# Patient Record
Sex: Female | Born: 1980
Health system: Southern US, Community
[De-identification: ages and names within clinical notes are randomized; demographics above are authoritative.]

## PROBLEM LIST (undated history)

## (undated) ENCOUNTER — Inpatient Hospital Stay (HOSPITAL_COMMUNITY): Payer: BC Managed Care – PPO

## (undated) ENCOUNTER — Inpatient Hospital Stay (HOSPITAL_COMMUNITY): Payer: 59

## (undated) ENCOUNTER — Inpatient Hospital Stay (HOSPITAL_COMMUNITY): Payer: Self-pay

## (undated) ENCOUNTER — Ambulatory Visit (HOSPITAL_COMMUNITY): Payer: BC Managed Care – PPO

## (undated) DIAGNOSIS — N979 Female infertility, unspecified: Secondary | ICD-10-CM

## (undated) DIAGNOSIS — K52831 Collagenous colitis: Secondary | ICD-10-CM

## (undated) DIAGNOSIS — D649 Anemia, unspecified: Secondary | ICD-10-CM

## (undated) DIAGNOSIS — R51 Headache: Secondary | ICD-10-CM

## (undated) DIAGNOSIS — E079 Disorder of thyroid, unspecified: Secondary | ICD-10-CM

## (undated) DIAGNOSIS — N750 Cyst of Bartholin's gland: Secondary | ICD-10-CM

## (undated) DIAGNOSIS — O30049 Twin pregnancy, dichorionic/diamniotic, unspecified trimester: Secondary | ICD-10-CM

## (undated) DIAGNOSIS — E039 Hypothyroidism, unspecified: Secondary | ICD-10-CM

## (undated) DIAGNOSIS — K900 Celiac disease: Secondary | ICD-10-CM

## (undated) HISTORY — DX: Headache: R51

## (undated) HISTORY — PX: RHINOPLASTY: SUR1284

## (undated) HISTORY — PX: COLONOSCOPY: SHX174

## (undated) HISTORY — PX: TONSILLECTOMY: SUR1361

## (undated) HISTORY — PX: WISDOM TOOTH EXTRACTION: SHX21

## (undated) HISTORY — PX: UPPER GASTROINTESTINAL ENDOSCOPY: SHX188

## (undated) HISTORY — DX: Female infertility, unspecified: N97.9

---

## 2007-02-22 ENCOUNTER — Emergency Department (HOSPITAL_COMMUNITY): Admission: EM | Admit: 2007-02-22 | Discharge: 2007-02-23 | Payer: Self-pay | Admitting: Emergency Medicine

## 2010-02-12 ENCOUNTER — Emergency Department (HOSPITAL_COMMUNITY): Admission: EM | Admit: 2010-02-12 | Discharge: 2010-02-12 | Payer: Self-pay | Admitting: Emergency Medicine

## 2010-05-23 ENCOUNTER — Emergency Department (HOSPITAL_COMMUNITY): Admission: EM | Admit: 2010-05-23 | Discharge: 2010-05-24 | Payer: Self-pay | Admitting: Emergency Medicine

## 2010-06-09 ENCOUNTER — Emergency Department (HOSPITAL_COMMUNITY): Admission: EM | Admit: 2010-06-09 | Discharge: 2010-06-09 | Payer: Self-pay | Admitting: Emergency Medicine

## 2010-11-05 ENCOUNTER — Inpatient Hospital Stay (HOSPITAL_COMMUNITY)
Admission: AD | Admit: 2010-11-05 | Discharge: 2010-11-05 | Payer: Self-pay | Source: Home / Self Care | Admitting: Obstetrics and Gynecology

## 2011-02-27 LAB — URINALYSIS, ROUTINE W REFLEX MICROSCOPIC
Bilirubin Urine: NEGATIVE
Hgb urine dipstick: NEGATIVE
Ketones, ur: NEGATIVE mg/dL
Specific Gravity, Urine: 1.006 (ref 1.005–1.030)

## 2011-02-27 LAB — URINE CULTURE
Colony Count: NO GROWTH
Culture: NO GROWTH

## 2011-02-28 LAB — COMPREHENSIVE METABOLIC PANEL
ALT: 17 U/L (ref 0–35)
AST: 19 U/L (ref 0–37)
Albumin: 3.6 g/dL (ref 3.5–5.2)
Alkaline Phosphatase: 32 U/L — ABNORMAL LOW (ref 39–117)
BUN: 8 mg/dL (ref 6–23)
Calcium: 8.9 mg/dL (ref 8.4–10.5)
Creatinine, Ser: 0.66 mg/dL (ref 0.4–1.2)
GFR calc Af Amer: >60 mL/min (ref >60)
Sodium: 142 mEq/L (ref 135–145)
Total Protein: 5.4 g/dL — ABNORMAL LOW (ref 6.0–8.3)

## 2011-02-28 LAB — DIFFERENTIAL
Eosinophils Absolute: 0.3 10*3/uL (ref 0.0–0.7)
Monocytes Absolute: 0.6 10*3/uL (ref 0.1–1.0)

## 2011-02-28 LAB — URINALYSIS, ROUTINE W REFLEX MICROSCOPIC
Glucose, UA: NEGATIVE mg/dL
Hgb urine dipstick: NEGATIVE
Ketones, ur: NEGATIVE mg/dL
Specific Gravity, Urine: 1.021 (ref 1.005–1.030)
Urobilinogen, UA: 0.2 mg/dL (ref 0.0–1.0)

## 2011-02-28 LAB — POCT PREGNANCY, URINE: Preg Test, Ur: NEGATIVE

## 2011-02-28 LAB — URINE MICROSCOPIC-ADD ON

## 2011-02-28 LAB — CBC
HCT: 35.7 % — ABNORMAL LOW (ref 36.0–46.0)
MCV: 91 fL (ref 78.0–100.0)
Platelets: 175 10*3/uL (ref 150–400)
RBC: 3.92 MIL/uL (ref 3.87–5.11)
WBC: 6.4 10*3/uL (ref 4.0–10.5)

## 2011-02-28 LAB — LIPASE, BLOOD: Lipase: 41 U/L (ref 11–59)

## 2011-03-07 LAB — URINE MICROSCOPIC-ADD ON

## 2011-03-07 LAB — WET PREP, GENITAL
Trich, Wet Prep: NONE SEEN
Yeast Wet Prep HPF POC: NONE SEEN

## 2011-03-07 LAB — URINALYSIS, ROUTINE W REFLEX MICROSCOPIC
Glucose, UA: NEGATIVE mg/dL
Specific Gravity, Urine: 1.022 (ref 1.005–1.030)
Urobilinogen, UA: 0.2 mg/dL (ref 0.0–1.0)

## 2011-03-07 LAB — GC/CHLAMYDIA PROBE AMP, GENITAL
Chlamydia, DNA Probe: NEGATIVE
GC Probe Amp, Genital: NEGATIVE

## 2011-03-07 LAB — POCT PREGNANCY, URINE: Preg Test, Ur: NEGATIVE

## 2011-10-24 ENCOUNTER — Encounter: Payer: Self-pay | Admitting: *Deleted

## 2011-10-24 ENCOUNTER — Emergency Department (HOSPITAL_COMMUNITY)
Admission: EM | Admit: 2011-10-24 | Discharge: 2011-10-24 | Discharge status: Against Medical Advice | Payer: BC Managed Care – PPO | Attending: Emergency Medicine | Admitting: Emergency Medicine

## 2011-10-24 DIAGNOSIS — N979 Female infertility, unspecified: Secondary | ICD-10-CM | POA: Insufficient documentation

## 2011-10-24 DIAGNOSIS — R51 Headache: Secondary | ICD-10-CM | POA: Insufficient documentation

## 2011-10-24 HISTORY — DX: Disorder of thyroid, unspecified: E07.9

## 2011-10-24 HISTORY — DX: Collagenous colitis: K52.831

## 2011-10-24 HISTORY — DX: Celiac disease: K90.0

## 2011-10-24 NOTE — ED Notes (Signed)
Patient would like to just leave at this time.  States she doesn't want to stay.  Informed her to stay and see md first.

## 2011-10-24 NOTE — ED Notes (Signed)
Pt in c/o blurred vision and episode of vision loss in right eye, pt states the vision loss has resolved, pt then states she was having trouble reading and understanding what she was seeing also with aphasia, these symptoms have resolved at this time, since then pt developed severe headache with no history of same, also with episode of right arm and facial numbness that has resolved

## 2011-10-24 NOTE — ED Notes (Signed)
[id number]<QM> Expected date:10/24/11<BR> Expected time: 8:15 PM<BR> Means of arrival:Ambulance<BR> Comments:<BR> EMS 110 GC, 75 yof UTI w fever

## 2011-10-24 NOTE — ED Provider Notes (Signed)
History     CSN: [id number] Arrival date & time: 10/24/2011  7:39 PM   First MD Initiated Contact with Patient 10/24/11 2014      Chief Complaint  Patient presents with  . Headache    (Consider location/radiation/quality/duration/timing/severity/associated sxs/prior treatment) HPI  Past Medical History  Diagnosis Date  . Celiac disease   . Thyroid disease   . Collagenous colitis     Past Surgical History  Procedure Date  . Tonsillectomy     History reviewed. No pertinent family history.  History  Substance Use Topics  . Smoking status: Never Smoker   . Smokeless tobacco: Not on file  . Alcohol Use: Yes    OB History    Grav Para Term Preterm Abortions TAB SAB Ect Mult Living                  Review of Systems  Allergies  Synthroid and Xifaxan  Home Medications   Current Outpatient Rx  Name Route Sig Dispense Refill  . LEVOTHYROXINE SODIUM 125 MCG PO TABS Oral Take 125 mcg by mouth daily.        BP 107/70  Pulse 80  Temp(Src) 99.1 F (37.3 C) (Oral)  Resp 18  SpO2 100%  LMP 10/02/2011  Physical Exam  ED Course  Procedures (including critical care time)  Labs Reviewed - No data to display No results found.   No diagnosis found.    MDM  Patient left after triage not seen by provider        Arman Filter, NP 10/24/11 2040

## 2011-10-25 NOTE — ED Provider Notes (Signed)
Medical screening examination/treatment/procedure(s) were performed by non-physician practitioner and as supervising physician I was immediately available for consultation/collaboration.    Jadene Stemmer R Felicia Both, MD 10/25/11 0001 

## 2011-12-13 NOTE — L&D Delivery Note (Signed)
Delivery Note   Emily [name], Emily [name] [[id number]]  At 4:42 PM a viable female was delivered via Vaginal, Spontaneous Delivery (Presentation: Left Occiput Anterior).  APGAR: 9, 9; weight 6 lb 2.9 oz (2805 g).   Placenta status: Intact, Spontaneous Pathology.  Cord: 3 vessels with the following complications: None, shoulder cord.  Anesthesia: Epidural  Episiotomy: none Lacerations: 2nd degree Suture Repair: 3.0 vicryl rapide Est. Blood Loss (mL): 400cc    Emily [name], Emily [name] [[id number]]  At 4:48 PM a viable female was delivered via  (Presentation: ;  ).  APGAR: , 9; weight 4 lb 7.8 oz (2035 g).   Placenta status: delivered, intwact .  Cord: 3 vessels with the following complications:none, shoulder cord.  Anesthesia:  epidural Episiotomy: none Lacerations: 2nd degree Suture Repair: 3.0 vicryl rapide Est. Blood Loss (mL): 400cc   Mom to postpartum.   Baby A to stay with mother and father.   Baby B to stay with mother and father.  BOVARD,Giles Currie 12/02/2012, 5:29 PM   B+/ Br/ RI / Contra?

## 2011-12-26 ENCOUNTER — Encounter (HOSPITAL_COMMUNITY): Payer: Self-pay | Admitting: Pharmacist

## 2011-12-26 ENCOUNTER — Encounter (HOSPITAL_COMMUNITY): Payer: Self-pay | Admitting: *Deleted

## 2011-12-27 ENCOUNTER — Encounter (HOSPITAL_COMMUNITY): Payer: Self-pay | Admitting: Obstetrics and Gynecology

## 2011-12-27 DIAGNOSIS — N750 Cyst of Bartholin's gland: Secondary | ICD-10-CM | POA: Diagnosis present

## 2011-12-27 HISTORY — DX: Cyst of Bartholin's gland: N75.0

## 2011-12-27 NOTE — H&P (Signed)
Emily [name] is an 31 y.o. female with recurrent Bartholin's Gland abscess/cyst.  Treated multiple times with I&D/ Word catheter.  Pt desires more definitive management, d/w pt R/B/A of marsupilization,  Pt desires  Ob/Gynecological History: G0P0 No abn pap No STDs Recurrent bartholin gland abscess/cyst Patient's last menstrual period was 12/26/2011.    Past Medical History  Diagnosis Date  . Celiac disease   . Thyroid disease   . Collagenous colitis   . Hypothyroidism   . Anemia     history  . Bartholin cyst 12/27/2011    Past Surgical History  Procedure Date  . Tonsillectomy   . Rhinoplasty   . Upper gastrointestinal endoscopy   . Colonoscopy     History reviewed. No pertinent family history.  Social History:  reports that she has never smoked. She has never used smokeless tobacco. She reports that she drinks alcohol. She reports that she does not use illicit drugs.  Allergies:  Allergies  Allergen Reactions  . Xifaxan Anaphylaxis  . Synthroid     GI upset with brand name Synthroid; tolerates Unithroid.   Meds BMZ cream, unithroid   Review of Systems  Constitutional: Negative.   HENT: Negative.   Eyes: Negative.   Respiratory: Negative.   Cardiovascular: Negative.   Gastrointestinal: Negative.   Genitourinary: Negative.   Musculoskeletal: Negative.   Skin: Negative.   Neurological: Negative.   Psychiatric/Behavioral: Negative.     Last menstrual period 12/26/2011. Physical Exam  Constitutional: She is oriented to person, place, and time. She appears well-developed and well-nourished.  HENT:  Head: Normocephalic and atraumatic.  Neck: Normal range of motion. Neck supple. No thyromegaly present.  Cardiovascular: Normal rate and regular rhythm.   Respiratory: Effort normal and breath sounds normal. No respiratory distress.  GI: Soft. Bowel sounds are normal. She exhibits no distension. There is no tenderness.  Musculoskeletal: Normal range of motion.    Neurological: She is alert and oriented to person, place, and time.  Skin: Skin is warm and dry.  Psychiatric: She has a normal mood and affect. Her behavior is normal.     Assessment/Plan: 30yo with recurrent Bartholin gland sbscess/cyst for marsuoilization after discussion of R/B/A wishes to proceed.  BOVARD,Tiphany Fayson 12/27/2011, 9:33 AM

## 2011-12-28 ENCOUNTER — Encounter (HOSPITAL_COMMUNITY): Payer: Self-pay | Admitting: *Deleted

## 2011-12-28 ENCOUNTER — Encounter (HOSPITAL_COMMUNITY): Payer: Self-pay | Admitting: Anesthesiology

## 2011-12-28 ENCOUNTER — Ambulatory Visit (HOSPITAL_COMMUNITY): Payer: BC Managed Care – PPO | Admitting: Anesthesiology

## 2011-12-28 ENCOUNTER — Encounter (HOSPITAL_COMMUNITY)
Admission: RE | Disposition: A | Discharge status: Home / Self Care | Payer: Self-pay | Source: Ambulatory Visit | Attending: Obstetrics and Gynecology

## 2011-12-28 ENCOUNTER — Ambulatory Visit (HOSPITAL_COMMUNITY)
Admission: RE | Admit: 2011-12-28 | Discharge: 2011-12-28 | Disposition: A | Discharge status: Home / Self Care | Payer: BC Managed Care – PPO | Source: Ambulatory Visit | Attending: Obstetrics and Gynecology | Admitting: Obstetrics and Gynecology

## 2011-12-28 DIAGNOSIS — N750 Cyst of Bartholin's gland: Secondary | ICD-10-CM | POA: Diagnosis present

## 2011-12-28 HISTORY — PX: BARTHOLIN CYST MARSUPIALIZATION: SHX5383

## 2011-12-28 HISTORY — DX: Hypothyroidism, unspecified: E03.9

## 2011-12-28 HISTORY — DX: Cyst of Bartholin's gland: N75.0

## 2011-12-28 HISTORY — DX: Anemia, unspecified: D64.9

## 2011-12-28 LAB — CBC
Hemoglobin: 12.6 g/dL (ref 12.0–15.0)
MCH: 29.3 pg (ref 26.0–34.0)
MCV: 86.5 fL (ref 78.0–100.0)
RBC: 4.3 MIL/uL (ref 3.87–5.11)
WBC: 7.6 10*3/uL (ref 4.0–10.5)

## 2011-12-28 LAB — PREGNANCY, URINE: Preg Test, Ur: NEGATIVE

## 2011-12-28 SURGERY — MARSUPIALIZATION, CYST, BARTHOLIN'S GLAND
Anesthesia: Monitor Anesthesia Care | Site: Vagina | Wound class: Clean Contaminated

## 2011-12-28 MED ORDER — ONDANSETRON HCL 4 MG/2ML IJ SOLN
INTRAMUSCULAR | Status: AC
Start: 2011-12-28 — End: 2011-12-28
  Filled 2011-12-28: qty 2

## 2011-12-28 MED ORDER — FENTANYL CITRATE 0.05 MG/ML IJ SOLN
INTRAMUSCULAR | Status: AC
  Administered 2011-12-28: 25 ug via INTRAVENOUS
  Filled 2011-12-28: qty 2

## 2011-12-28 MED ORDER — FENTANYL CITRATE 0.05 MG/ML IJ SOLN
INTRAMUSCULAR | Status: DC | PRN
  Administered 2011-12-28 (×2): 50 ug via INTRAVENOUS

## 2011-12-28 MED ORDER — FENTANYL CITRATE 0.05 MG/ML IJ SOLN
25.0000 ug | INTRAMUSCULAR | Status: DC | PRN
  Administered 2011-12-28 (×2): 25 ug via INTRAVENOUS
  Administered 2011-12-28: 50 ug via INTRAVENOUS

## 2011-12-28 MED ORDER — MIDAZOLAM HCL 5 MG/5ML IJ SOLN
INTRAMUSCULAR | Status: DC | PRN
  Administered 2011-12-28: 1 mg via INTRAVENOUS

## 2011-12-28 MED ORDER — KETOROLAC TROMETHAMINE 30 MG/ML IJ SOLN
30.0000 mg | Freq: Once | INTRAMUSCULAR | Status: AC
  Administered 2011-12-28: 30 mg via INTRAVENOUS

## 2011-12-28 MED ORDER — PROPOFOL 10 MG/ML IV EMUL
INTRAVENOUS | Status: DC | PRN
  Administered 2011-12-28: 140 ug/kg/min via INTRAVENOUS

## 2011-12-28 MED ORDER — FENTANYL CITRATE 0.05 MG/ML IJ SOLN
INTRAMUSCULAR | Status: AC
  Filled 2011-12-28: qty 2

## 2011-12-28 MED ORDER — CEFAZOLIN SODIUM 1-5 GM-% IV SOLN
1.0000 g | INTRAVENOUS | Status: AC
  Administered 2011-12-28: 1 g via INTRAVENOUS

## 2011-12-28 MED ORDER — OXYCODONE-ACETAMINOPHEN 5-325 MG PO TABS
ORAL_TABLET | ORAL | Status: AC
  Administered 2011-12-28: 1 via ORAL
  Filled 2011-12-28: qty 1

## 2011-12-28 MED ORDER — KETOROLAC TROMETHAMINE 30 MG/ML IJ SOLN
INTRAMUSCULAR | Status: AC
  Filled 2011-12-28: qty 1

## 2011-12-28 MED ORDER — METRONIDAZOLE 500 MG PO TABS: 500.0000 mg | ORAL_TABLET | Freq: Three times a day (TID) | ORAL | Status: AC

## 2011-12-28 MED ORDER — ONDANSETRON HCL 4 MG/2ML IJ SOLN
INTRAMUSCULAR | Status: DC | PRN
  Administered 2011-12-28: 4 mg via INTRAVENOUS

## 2011-12-28 MED ORDER — LACTATED RINGERS IV SOLN
INTRAVENOUS | Status: DC
  Administered 2011-12-28: 15:00:00 via INTRAVENOUS
  Administered 2011-12-28: 125 mL/h via INTRAVENOUS

## 2011-12-28 MED ORDER — MEPERIDINE HCL 25 MG/ML IJ SOLN: 6.2500 mg | INTRAMUSCULAR | Status: DC | PRN

## 2011-12-28 MED ORDER — LIDOCAINE HCL (CARDIAC) 20 MG/ML IV SOLN
INTRAVENOUS | Status: AC
  Filled 2011-12-28: qty 5

## 2011-12-28 MED ORDER — PROPOFOL 10 MG/ML IV EMUL
INTRAVENOUS | Status: AC
Start: 2011-12-28 — End: 2011-12-28
  Filled 2011-12-28: qty 50

## 2011-12-28 MED ORDER — OXYCODONE-ACETAMINOPHEN 5-325 MG PO TABS
1.0000 | ORAL_TABLET | ORAL | Status: DC | PRN
  Administered 2011-12-28: 1 via ORAL

## 2011-12-28 MED ORDER — MIDAZOLAM HCL 2 MG/2ML IJ SOLN
INTRAMUSCULAR | Status: AC
Start: 2011-12-28 — End: 2011-12-28
  Filled 2011-12-28: qty 2

## 2011-12-28 MED ORDER — KETOROLAC TROMETHAMINE 30 MG/ML IJ SOLN
INTRAMUSCULAR | Status: AC
  Administered 2011-12-28: 30 mg via INTRAVENOUS
  Filled 2011-12-28: qty 1

## 2011-12-28 MED ORDER — LIDOCAINE-EPINEPHRINE (PF) 2 %-1:200000 IJ SOLN
INTRAMUSCULAR | Status: DC | PRN
  Administered 2011-12-28: 10 mL

## 2011-12-28 MED ORDER — LIDOCAINE HCL 1 % IJ SOLN: INTRAMUSCULAR | Status: DC | PRN

## 2011-12-28 MED ORDER — CEFAZOLIN SODIUM 1-5 GM-% IV SOLN
INTRAVENOUS | Status: AC
  Filled 2011-12-28: qty 50

## 2011-12-28 SURGICAL SUPPLY — 23 items
BLADE SURG 11 STRL SS (BLADE) ×2 IMPLANT
CLOTH BEACON ORANGE TIMEOUT ST (SAFETY) ×2 IMPLANT
CONTAINER PREFILL 10% NBF 15ML (MISCELLANEOUS) IMPLANT
COUNTER NEEDLE 1200 MAGNETIC (NEEDLE) IMPLANT
ELECT REM PT RETURN 9FT ADLT (ELECTROSURGICAL)
ELECTRODE REM PT RTRN 9FT ADLT (ELECTROSURGICAL) IMPLANT
GLOVE BIO SURGEON STRL SZ 6.5 (GLOVE) ×4 IMPLANT
GOWN PREVENTION PLUS LG XLONG (DISPOSABLE) ×4 IMPLANT
NDL HYPO 25X1 1.5 SAFETY (NEEDLE) ×1 IMPLANT
NEEDLE HYPO 25X1 1.5 SAFETY (NEEDLE) ×2 IMPLANT
NS IRRIG 1000ML POUR BTL (IV SOLUTION) ×2 IMPLANT
PACK VAGINAL MINOR WOMEN LF (CUSTOM PROCEDURE TRAY) ×2 IMPLANT
PAD PREP 24X48 CUFFED NSTRL (MISCELLANEOUS) ×2 IMPLANT
PENCIL BUTTON HOLSTER BLD 10FT (ELECTRODE) IMPLANT
SUT VIC AB 3-0 CT1 27 (SUTURE) ×6
SUT VIC AB 3-0 CT1 TAPERPNT 27 (SUTURE) ×3 IMPLANT
SWAB CULTURE LIQ STUART DBL (MISCELLANEOUS) IMPLANT
SYR CONTROL 10ML LL (SYRINGE) IMPLANT
TOWEL OR 17X24 6PK STRL BLUE (TOWEL DISPOSABLE) ×4 IMPLANT
TUBE ANAEROBIC SPECIMEN COL (MISCELLANEOUS) IMPLANT
TUBING NON-CON 1/4 X 20 CONN (TUBING) IMPLANT
WATER STERILE IRR 1000ML POUR (IV SOLUTION) ×1 IMPLANT
YANKAUER SUCT BULB TIP NO VENT (SUCTIONS) IMPLANT

## 2011-12-28 NOTE — Anesthesia Preprocedure Evaluation (Addendum)
Anesthesia Evaluation  Patient identified by MRN, date of birth, ID band Patient awake    Reviewed: Allergy & Precautions, H&P , NPO status , Patient's Chart, lab work & pertinent test results  Airway Mallampati: I TM Distance: >3 FB Neck ROM: Full    Dental No notable dental hx. (+) Teeth Intact   Pulmonary neg pulmonary ROS,  clear to auscultation  Pulmonary exam normal       Cardiovascular neg cardio ROS Regular Normal    Neuro/Psych Negative Neurological ROS  Negative Psych ROS   GI/Hepatic Neg liver ROS, Chronic colitis-  Celiac Disease   Endo/Other  Hypothyroidism   Renal/GU negative Renal ROS  Genitourinary negative   Musculoskeletal negative musculoskeletal ROS (+)   Abdominal   Peds  Hematology negative hematology ROS (+)   Anesthesia Other Findings   Reproductive/Obstetrics negative OB ROS                          Anesthesia Physical Anesthesia Plan  ASA: II  Anesthesia Plan: MAC   Post-op Pain Management:    Induction: Intravenous  Airway Management Planned: Mask and Natural Airway  Additional Equipment:   Intra-op Plan:   Post-operative Plan:   Informed Consent: I have reviewed the patients History and Physical, chart, labs and discussed the procedure including the risks, benefits and alternatives for the proposed anesthesia with the patient or authorized representative who has indicated his/her understanding and acceptance.     Plan Discussed with: Anesthesiologist, CRNA and Surgeon  Anesthesia Plan Comments:         Anesthesia Quick Evaluation

## 2011-12-28 NOTE — Interval H&P Note (Signed)
History and Physical Interval Note:  12/28/2011 2:27 PM  Emily [name]  has presented today for surgery, with the diagnosis of Bartholin's Cyst  The various methods of treatment have been discussed with the patient and family. After consideration of risks, benefits and other options for treatment, the patient has consented to  Procedure(s): BARTHOLIN CYST MARSUPIALIZATION as a surgical intervention .  The patients' history has been reviewed, patient examined, no change in status, stable for surgery.  I have reviewed the patients' chart and labs.  Questions were answered to the patient's satisfaction.     [name],Emily Spearing  H&P reviewed, no changes.  Procedure reviewed including risks/benefits and alternatives, will proceed J [name]

## 2011-12-28 NOTE — Brief Op Note (Signed)
12/28/2011  3:26 PM  PATIENT:  Emily [name]  31 y.o. female  PRE-OPERATIVE DIAGNOSIS:  Bartholin's Cyst  POST-OPERATIVE DIAGNOSIS:  Bartholin's Cyst  PROCEDURE:  Procedure(s): BARTHOLIN CYST MARSUPIALIZATION  SURGEON:  Surgeon(s): Sherron Monday, MD  ANESTHESIA:   IV sedation  EBL:   minimal  DRAINS: none   LOCAL MEDICATIONS USED:  LIDOCAINE 2% with epinephrine 8CC  SPECIMEN:  No Specimen  DISPOSITION OF SPECIMEN:  N/A  COUNTS:  YES  DICTATION: .Other Dictation: Dictation Number [id number]  PLAN OF CARE: Discharge to home after PACU  PATIENT DISPOSITION:  PACU - hemodynamically stable.   Delay start of Pharmacological VTE agent (>24hrs) due to surgical blood loss or risk of bleeding:  {YES/NO/NOT APPLICABLE:20182

## 2011-12-28 NOTE — Transfer of Care (Signed)
Immediate Anesthesia Transfer of Care Note  Patient: Emily [name]  Procedure(s) Performed:  BARTHOLIN CYST MARSUPIALIZATION  Patient Location: PACU  Anesthesia Type: MAC combined with regional for post-op pain  Level of Consciousness: awake, alert  and oriented  Airway & Oxygen Therapy: Patient Spontanous Breathing  Post-op Assessment: Report given to PACU RN and Post -op Vital signs reviewed and stable  Post vital signs: Reviewed and stable Filed Vitals:   12/28/11 1409  BP: 119/79  Pulse: 89  Temp: 37 C  Resp: 18    Complications: No apparent anesthesia complications

## 2011-12-28 NOTE — Anesthesia Postprocedure Evaluation (Signed)
  Anesthesia Post-op Note  Patient: Emily [name]  Procedure(s) Performed:  BARTHOLIN CYST MARSUPIALIZATION  Patient is awake and responsive. Pain and nausea are reasonably well controlled. Vital signs are stable and clinically acceptable. Oxygen saturation is clinically acceptable. There are no apparent anesthetic complications at this time. Patient is ready for discharge.

## 2011-12-29 ENCOUNTER — Encounter (HOSPITAL_COMMUNITY): Payer: Self-pay | Admitting: Obstetrics and Gynecology

## 2011-12-29 NOTE — Op Note (Signed)
Emily [name], Emily [name]                ACCOUNT NO.:  [id number]  MEDICAL RECORD NO.:  [id number]  LOCATION:  [facility]                          FACILITY:  [facility]  PHYSICIAN:  Sherron Monday, MD        DATE OF BIRTH:  08-12-1981  DATE OF PROCEDURE:  12/28/2011 DATE OF DISCHARGE:  12/28/2011                              OPERATIVE REPORT   PREOPERATIVE DIAGNOSIS:  Recurrent Bartholin cyst.  POSTOPERATIVE DIAGNOSIS:  Recurrent Bartholin cyst.  PROCEDURE:  Marsupialization of Bartholin cyst.  SURGEON:  Sherron Monday, MD  ANESTHESIA:  IV sedation, 2% lidocaine with epinephrine of 8 mL.  ESTIMATED BLOOD LOSS:  Minimal.  URINE OUTPUT:  I and O cath prior to the procedure.  COMPLICATIONS:  None.  PATHOLOGY:  None.  PROCEDURE:  After informed consent was reviewed with the patient including risks, benefits, and alternatives of the surgical procedure. She was transported to the OR, placed on the table in supine position. Sedation was induced and found to be adequate.  She was then placed in the [place] stirrups, prepped and draped in the normal sterile fashion. Her bladder was sterilely drained.  Her Bartholin gland was anesthetized with 2% lidocaine with epinephrine.  Inside the hymenal ring, approximately 1 cm incision was made and the cyst was found to be an abscess and was drained.  The abscess was then irrigated with saline and lidocaine.  The incision was outlined holding the cyst wall to the incision with 3-0 Vicryl.  Sponge, lap, and needle counts were correct x2.  The patient tolerated the procedure well.     Sherron Monday, MD     JB/MEDQ  D:  12/28/2011  T:  12/29/2011  Job:  [id number]

## 2012-05-28 LAB — OB RESULTS CONSOLE RUBELLA ANTIBODY, IGM: Rubella: IMMUNE

## 2012-05-28 LAB — OB RESULTS CONSOLE HEPATITIS B SURFACE ANTIGEN: Hepatitis B Surface Ag: NEGATIVE

## 2012-05-28 LAB — OB RESULTS CONSOLE ABO/RH

## 2012-05-28 LAB — OB RESULTS CONSOLE ANTIBODY SCREEN: Antibody Screen: NEGATIVE

## 2012-05-28 LAB — OB RESULTS CONSOLE GC/CHLAMYDIA: Gonorrhea: NEGATIVE

## 2012-10-07 ENCOUNTER — Ambulatory Visit (HOSPITAL_COMMUNITY)
Admit: 2012-10-07 | Discharge: 2012-10-07 | Disposition: A | Discharge status: Home / Self Care | Payer: BC Managed Care – PPO | Source: Ambulatory Visit | Attending: Obstetrics and Gynecology | Admitting: Obstetrics and Gynecology

## 2012-10-07 ENCOUNTER — Inpatient Hospital Stay (HOSPITAL_COMMUNITY)
Admission: AD | Admit: 2012-10-07 | Discharge: 2012-10-07 | Disposition: A | Discharge status: Home / Self Care | Payer: BC Managed Care – PPO | Source: Ambulatory Visit | Attending: Obstetrics and Gynecology | Admitting: Obstetrics and Gynecology

## 2012-10-07 ENCOUNTER — Encounter (HOSPITAL_COMMUNITY): Payer: Self-pay | Admitting: *Deleted

## 2012-10-07 DIAGNOSIS — O99891 Other specified diseases and conditions complicating pregnancy: Secondary | ICD-10-CM | POA: Insufficient documentation

## 2012-10-07 DIAGNOSIS — O26899 Other specified pregnancy related conditions, unspecified trimester: Secondary | ICD-10-CM

## 2012-10-07 DIAGNOSIS — F29 Unspecified psychosis not due to a substance or known physiological condition: Secondary | ICD-10-CM | POA: Insufficient documentation

## 2012-10-07 DIAGNOSIS — R0602 Shortness of breath: Secondary | ICD-10-CM | POA: Insufficient documentation

## 2012-10-07 DIAGNOSIS — R519 Headache, unspecified: Secondary | ICD-10-CM

## 2012-10-07 DIAGNOSIS — O30009 Twin pregnancy, unspecified number of placenta and unspecified number of amniotic sacs, unspecified trimester: Secondary | ICD-10-CM | POA: Insufficient documentation

## 2012-10-07 DIAGNOSIS — R209 Unspecified disturbances of skin sensation: Secondary | ICD-10-CM | POA: Insufficient documentation

## 2012-10-07 DIAGNOSIS — G43809 Other migraine, not intractable, without status migrainosus: Secondary | ICD-10-CM

## 2012-10-07 DIAGNOSIS — G43109 Migraine with aura, not intractable, without status migrainosus: Secondary | ICD-10-CM

## 2012-10-07 LAB — URIC ACID: Uric Acid, Serum: 3.8 mg/dL (ref 2.4–7.0)

## 2012-10-07 LAB — URINE MICROSCOPIC-ADD ON

## 2012-10-07 LAB — URINALYSIS, ROUTINE W REFLEX MICROSCOPIC
Ketones, ur: 15 mg/dL — AB
Nitrite: NEGATIVE
Protein, ur: NEGATIVE mg/dL
Urobilinogen, UA: 0.2 mg/dL (ref 0.0–1.0)

## 2012-10-07 LAB — COMPREHENSIVE METABOLIC PANEL
ALT: 9 U/L (ref 0–35)
AST: 15 U/L (ref 0–37)
Albumin: 2.7 g/dL — ABNORMAL LOW (ref 3.5–5.2)
Alkaline Phosphatase: 103 U/L (ref 39–117)
BUN: 4 mg/dL — ABNORMAL LOW (ref 6–23)
Potassium: 3.5 mEq/L (ref 3.5–5.1)
Sodium: 136 mEq/L (ref 135–145)
Total Protein: 5.5 g/dL — ABNORMAL LOW (ref 6.0–8.3)

## 2012-10-07 LAB — CBC
MCHC: 33.7 g/dL (ref 30.0–36.0)
Platelets: 164 10*3/uL (ref 150–400)
RDW: 13.1 % (ref 11.5–15.5)
WBC: 12.8 10*3/uL — ABNORMAL HIGH (ref 4.0–10.5)

## 2012-10-07 MED ORDER — FERROUS SULFATE 325 (65 FE) MG PO TABS: 325.0000 mg | ORAL_TABLET | Freq: Three times a day (TID) | ORAL | Status: DC

## 2012-10-07 NOTE — MAU Note (Signed)
Pt off monitor for transport to [facility] radiology department.  Pt and husband verbalize understanding of POC.  Pt to return to MAU following scan.

## 2012-10-07 NOTE — MAU Provider Note (Signed)
History     CSN: [id number]  Arrival date and time: 10/07/12 1122   None     Chief Complaint  Patient presents with  . Shortness of Breath  . Numbness   HPI 31 y.o. G1P0 at [redacted]w[redacted]d with twins. Reports episode beginning about 45 minutes prior to arrival to MAU, at church, began feeling hot/short of breath, difficulty speaking/slurred speech, developed numbness in right hand and eventually entire right arm. Pt's husband reports that patient began to "panic" and hyperventilate, now feeling better. Still reports dizziness, slight confusion, "seeing spots" and visual changes. Pregnancy uncomplicated except for feelings of shortness of breath, cardiology referral has been recommended, but patient and husband state that they wanted to wait and see how the problem developed before having further evaluation.   Past Medical History  Diagnosis Date  . Celiac disease   . Thyroid disease   . Collagenous colitis   . Hypothyroidism   . Anemia     history  . Bartholin cyst 12/27/2011    Past Surgical History  Procedure Date  . Tonsillectomy   . Rhinoplasty   . Upper gastrointestinal endoscopy   . Colonoscopy   . Bartholin cyst marsupialization 12/28/2011    Procedure: BARTHOLIN CYST MARSUPIALIZATION;  Surgeon: Sherron Monday, MD;  Location: [facility];  Service: Gynecology;  Laterality: N/A;    No family history on file.  History  Substance Use Topics  . Smoking status: Never Smoker   . Smokeless tobacco: Never Used  . Alcohol Use: Yes     socially    Allergies:  Allergies  Allergen Reactions  . Rifaximin Anaphylaxis  . Levothyroxine Sodium     GI upset with brand name Synthroid; tolerates Unithroid.    Prescriptions prior to admission  Medication Sig Dispense Refill  . betamethasone dipropionate (DIPROLENE) 0.05 % cream Apply topically as needed. For affected area      . levothyroxine (UNITHROID) 125 MCG tablet Take 125 mcg by mouth daily.          Review of Systems    Constitutional: Negative.  Negative for fever and chills.  HENT: Negative for hearing loss and neck pain.   Eyes: Positive for blurred vision.       + scotomata  Respiratory: Positive for shortness of breath. Negative for cough and wheezing.   Cardiovascular: Negative.  Negative for chest pain and palpitations.  Gastrointestinal: Negative for nausea, vomiting, abdominal pain, diarrhea and constipation.  Genitourinary: Negative for dysuria, urgency, frequency, hematuria and flank pain.       Negative for vaginal bleeding, cramping/contractions  Musculoskeletal: Negative.  Negative for myalgias.  Neurological: Positive for dizziness, tingling (right arm/hand - transient), sensory change (numbness right arm/hand - transient) and speech change ("slurred" speech, difficulty with word recall). Negative for focal weakness, seizures, loss of consciousness and headaches.  Psychiatric/Behavioral: The patient is nervous/anxious.    Physical Exam   Blood pressure 111/58, pulse 109, temperature 97.1 F (36.2 C), temperature source Oral, resp. rate 18, SpO2 100.00%.  Physical Exam  Nursing note and vitals reviewed. Constitutional: She is oriented to person, place, and time. She appears well-developed and well-nourished. No distress.  HENT:  Head: Normocephalic and atraumatic.  Eyes: Conjunctivae normal and EOM are normal. Pupils are equal, round, and reactive to light.  Neck: Normal range of motion.  Cardiovascular: Normal rate.   Respiratory: Effort normal. No respiratory distress.  GI: Soft. There is no tenderness.  Musculoskeletal: Normal range of motion.  Neurological: She is alert and  oriented to person, place, and time. She has normal reflexes. No cranial nerve deficit.  Skin: Skin is warm and dry. She is not diaphoretic.  Psychiatric: Her speech is normal. Her mood appears anxious. She is slowed. Cognition and memory are impaired (pt unable to state her last name, states she is having  difficulty understanding speech, difficulty recalling certain words in conversation, but able to answer and ask somewhat complex questions).    MAU Course  Procedures  Dr. Ellyn Hack to MAU to see patient  Results for orders placed during the hospital encounter of 10/07/12 (from the past 24 hour(s))  URINALYSIS, ROUTINE W REFLEX MICROSCOPIC     Status: Abnormal   Collection Time   10/07/12 12:00 PM      Component Value Range   Color, Urine YELLOW  YELLOW   APPearance CLEAR  CLEAR   Specific Gravity, Urine 1.015  1.005 - 1.030   pH 8.5 (*) 5.0 - 8.0   Glucose, UA NEGATIVE  NEGATIVE mg/dL   Hgb urine dipstick NEGATIVE  NEGATIVE   Bilirubin Urine NEGATIVE  NEGATIVE   Ketones, ur 15 (*) NEGATIVE mg/dL   Protein, ur NEGATIVE  NEGATIVE mg/dL   Urobilinogen, UA 0.2  0.0 - 1.0 mg/dL   Nitrite NEGATIVE  NEGATIVE   Leukocytes, UA MODERATE (*) NEGATIVE  CBC     Status: Abnormal   Collection Time   10/07/12 12:00 PM      Component Value Range   WBC 12.8 (*) 4.0 - 10.5 K/uL   RBC 3.18 (*) 3.87 - 5.11 MIL/uL   Hemoglobin 8.6 (*) 12.0 - 15.0 g/dL   HCT 45.4 (*) 09.8 - 11.9 %   MCV 80.2  78.0 - 100.0 fL   MCH 27.0  26.0 - 34.0 pg   MCHC 33.7  30.0 - 36.0 g/dL   RDW 14.7  82.9 - 56.2 %   Platelets 164  150 - 400 K/uL  COMPREHENSIVE METABOLIC PANEL     Status: Abnormal   Collection Time   10/07/12 12:00 PM      Component Value Range   Sodium 136  135 - 145 mEq/L   Potassium 3.5  3.5 - 5.1 mEq/L   Chloride 103  96 - 112 mEq/L   CO2 22  19 - 32 mEq/L   Glucose, Bld 85  70 - 99 mg/dL   BUN 4 (*) 6 - 23 mg/dL   Creatinine, Ser 1.30  0.50 - 1.10 mg/dL   Calcium 8.2 (*) 8.4 - 10.5 mg/dL   Total Protein 5.5 (*) 6.0 - 8.3 g/dL   Albumin 2.7 (*) 3.5 - 5.2 g/dL   AST 15  0 - 37 U/L   ALT 9  0 - 35 U/L   Alkaline Phosphatase 103  39 - 117 U/L   Total Bilirubin 0.3  0.3 - 1.2 mg/dL   GFR calc non Af Amer >90  >90 mL/min   GFR calc Af Amer >90  >90 mL/min  URIC ACID     Status: Normal    Collection Time   10/07/12 12:00 PM      Component Value Range   Uric Acid, Serum 3.8  2.4 - 7.0 mg/dL  LACTATE DEHYDROGENASE     Status: Normal   Collection Time   10/07/12 12:00 PM      Component Value Range   LDH 165  94 - 250 U/L  URINE MICROSCOPIC-ADD ON     Status: Abnormal   Collection Time  10/07/12 12:00 PM      Component Value Range   Squamous Epithelial / LPF MANY (*) RARE   WBC, UA 3-6  <3 WBC/hpf   RBC / HPF 0-2  <3 RBC/hpf   Bacteria, UA FEW (*) RARE   Mr Brain Wo Contrast  10/07/2012  *RADIOLOGY REPORT*  Clinical Data:  31 year old [redacted] weeks pregnant with twins presenting with episode of confusion and right arm numbness which has now resolved.  MRI HEAD WITHOUT CONTRAST  Technique:  Multiplanar, multiecho pulse sequences of the brain and surrounding structures were obtained without intravenous contrast.  Comparison:   None.  Findings:  On the diffusion sequence, artifact right cerebellum and right pons.  Taking this limitation into account, no acute infarct is noted.  No intracranial hemorrhage.  No intracranial mass lesion detected on this unenhanced exam.  No hydrocephalus.  No evidence of posterior reversible encephalopathy syndrome.  Major intracranial vascular structures are patent.  The pituitary gland is prominent consistent with the patient's pregnant state.  No definitive findings of pituitary hemorrhage.  Mild maxillary sinus mucosal thickening and minimal ethmoid sinus air cell mucosal thickening.  IMPRESSION: No acute infarct.  Please see above.  MRV HEAD WITHOUT CONTRAST  Technique:  Angiographic images of the intracranial venous structures were obtained using MRV technique without intravenous contrast.  Findings:  Major dural sinuses are patent.  IMPRESSION: Major dural sinuses are patent.   Original Report Authenticated By: Fuller Canada, M.D.    Mr [place]  10/07/2012  *RADIOLOGY REPORT*  Clinical Data:  31 year old [redacted] weeks pregnant with twins  presenting with episode of confusion and right arm numbness which has now resolved.  MRI HEAD WITHOUT CONTRAST  Technique:  Multiplanar, multiecho pulse sequences of the brain and surrounding structures were obtained without intravenous contrast.  Comparison:   None.  Findings:  On the diffusion sequence, artifact right cerebellum and right pons.  Taking this limitation into account, no acute infarct is noted.  No intracranial hemorrhage.  No intracranial mass lesion detected on this unenhanced exam.  No hydrocephalus.  No evidence of posterior reversible encephalopathy syndrome.  Major intracranial vascular structures are patent.  The pituitary gland is prominent consistent with the patient's pregnant state.  No definitive findings of pituitary hemorrhage.  Mild maxillary sinus mucosal thickening and minimal ethmoid sinus air cell mucosal thickening.  IMPRESSION: No acute infarct.  Please see above.  MRV HEAD WITHOUT CONTRAST  Technique:  Angiographic images of the intracranial venous structures were obtained using MRV technique without intravenous contrast.  Findings:  Major dural sinuses are patent.  IMPRESSION: Major dural sinuses are patent.   Original Report Authenticated By: Fuller Canada, M.D.    Neuro consult with Dr. Thad Ranger: normal MRI/MRV, likely complex migraine, no further neuro follow up needed at this time  Assessment and Plan   1. Migraine variant   2. Headache in pregnancy       Medication List     As of 10/07/2012  5:58 PM    START taking these medications         ferrous sulfate 325 (65 FE) MG tablet   Take 1 tablet (325 mg total) by mouth 3 (three) times daily with meals.      CONTINUE taking these medications         betamethasone dipropionate 0.05 % cream   Commonly known as: DIPROLENE      UNITHROID 125 MCG tablet   Generic drug: levothyroxine  Where to get your medications    These are the prescriptions that you need to pick up. We sent them to a  specific pharmacy, so you will need to go there to get them.   [id number] - [place], [place] - [address] AT [address] [facility] [address]    [address] [place] [address]    Phone: [phone]        ferrous sulfate 325 (65 FE) MG tablet            Follow-up Information    Follow up with BOVARD,JODY, MD. (as scheduled or sooner PRN)    Contact information:   [address] [place] [address] [phone]            FRAZIER,NATALIE 10/07/2012, 11:45 AM

## 2012-10-07 NOTE — MAU Note (Signed)
Pt was sitting in church and started feeling SOB. Then her right hand started feeling numb and cold. Her thraot starteated to fee like it was also cold and "closing up. Husband brought her in to MAU . Took her strait back to room  Had her slow down her breathing and she stared to feel better O2 sat 100%on room AIr.

## 2012-10-07 NOTE — H&P (Addendum)
Emily [name] is a 31 y.o. female G1 P0 at 50+ with twins, with word finding difficulty, confusion, R arm numbness.  Was in church, got very hot, left to another room and other symptoms started.  States eloquently worried about test results.  Couldn't remember last name, can't place friends, stated "i feel like you're speaking a foreign language" to NP's questioning.  Otherwise nl pregnancy sx's History OB History    Grav Para Term Preterm Abortions TAB SAB Ect Mult Living   1             G1 twins, EDC 1/10;  Past Medical History  Diagnosis Date  . Celiac disease   . Thyroid disease   . Collagenous colitis   . Hypothyroidism   . Anemia     history  . Bartholin cyst 12/27/2011   Past Surgical History  Procedure Date  . Tonsillectomy   . Rhinoplasty   . Upper gastrointestinal endoscopy   . Colonoscopy   . Bartholin cyst marsupialization 12/28/2011    Procedure: BARTHOLIN CYST MARSUPIALIZATION;  Surgeon: Sherron Monday, MD;  Location: [facility];  Service: Gynecology;  Laterality: N/A;   Family History: family history is not on file. Social History:  reports that she has never smoked. She has never used smokeless tobacco. She reports that she drinks alcohol. She reports that she does not use illicit drugs.divorced/remarried, works in [employer] cabin crew PNV, Unithyroid All no latex, synthroid, xifaxen (anaphylaxis)   Prenatal Transfer Tool  Maternal Diabetes: No Genetic Screening: Normal Maternal Ultrasounds/Referrals: Normal, twins (DI, DI) Fetal Ultrasounds or other Referrals:  None Maternal Substance Abuse:  No Significant Maternal Medications:  Meds include: Other: Unithroid, PNV Significant Maternal Lab Results:  None Other Comments:  DI/Di twins, have had concordant growth; maternal celiac, hypothyroid, colitis  ROS per NP note    Blood pressure 111/58, pulse 109, temperature 97.1 F (36.2 C), temperature source Oral, resp. rate 18, SpO2 100.00%. Exam Physical Exam per NP  note Prenatal labs: ABO, Rh:  B+  Antibody:  neg Rubella:  immune RPR:  NR  HBsAg:   neg HIV:   neg GBS:   unknown  Hgb 12.0/ Pap WNL/ Plts 201K/ GC neg/ Chl neg/ First tri neg/ AFP neg/ glucola 81  9wk [place] EDC 1/10 Nl anat, good growth, ant plac x 2  Tdap/flu - 09/24/12   Assessment/Plan: 31yo G1 with Neuro symptoms D/w neurology, will come for consult. MRI/MRV D/w pt wide differential - atypical migraine/ panic attack, CVA, TIA  BOVARD,Nyia Tsao 10/07/2012, 1:01 PM

## 2012-10-07 NOTE — Consult Note (Signed)
Reason for Consult:Difficulty with speech and right upper extremity numbness Referring Physician: Bovard  CC: Difficulty with speech and right upper extremity numbness  HPI: Emily [name] is an 31 y.o. female G1, P0 at 30+ weeks with twins who reports that she was at church today and had the acute onset of tingling in her fingers on her right hand.  It started with one finger and quickly spread one-by-one to all the fingers and then went up the arm.  Patient then had a cold sensation in her throat and felt as if she could not get her words out.  Had difficulty understanding as well.  Patient was brought in for evaluation.  Symptoms lasted for about an hour and then resolved.  Patient after about 45 minutes began to develop a headache that continues.  The headache is mostly left sided but feels all over.  It is an ache.  There is no associated nausea, vomiting, photophobia or phonophobia.  Patient rates the headache at 6/10.  She reports that she had a similar episode with headache in November of 2012.  She presented for evaluation but her symptoms resolved prior to her being seen and she left without being evaluated.    Past Medical History  Diagnosis Date  . Celiac disease   . Thyroid disease   . Collagenous colitis   . Hypothyroidism   . Anemia     history  . Bartholin cyst 12/27/2011    Past Surgical History  Procedure Date  . Tonsillectomy   . Rhinoplasty   . Upper gastrointestinal endoscopy   . Colonoscopy   . Bartholin cyst marsupialization 12/28/2011    Procedure: BARTHOLIN CYST MARSUPIALIZATION;  Surgeon: Sherron Monday, MD;  Location: [facility];  Service: Gynecology;  Laterality: N/A;    Family history: No family history of strokes, migraines, or aneurysms  Social History:  reports that she has never smoked. She has never used smokeless tobacco. She reports that she drinks alcohol. She reports that she does not use illicit drugs.  Allergies  Allergen Reactions  . Rifaximin  Anaphylaxis  . Levothyroxine Sodium     GI upset with brand name Synthroid; tolerates Unithroid.    Medications:  I have reviewed the patient's current medications. Prior to Admission:  Prescriptions prior to admission  Medication Sig Dispense Refill  . betamethasone dipropionate (DIPROLENE) 0.05 % cream Apply topically as needed. For affected area      . levothyroxine (UNITHROID) 125 MCG tablet Take 125 mcg by mouth daily.         Scheduled:    ROS: History obtained from the patient  General ROS: negative for - chills, fatigue, fever, night sweats, weight gain or weight loss Psychological ROS: negative for - behavioral disorder, hallucinations, memory difficulties, mood swings or suicidal ideation Ophthalmic ROS: negative for - blurry vision, double vision, eye pain or loss of vision ENT ROS: negative for - epistaxis, nasal discharge, oral lesions, sore throat, tinnitus or vertigo Allergy and Immunology ROS: negative for - hives or itchy/watery eyes Hematological and Lymphatic ROS: negative for - bleeding problems, bruising or swollen lymph nodes Endocrine ROS: negative for - galactorrhea, hair pattern changes, polydipsia/polyuria or temperature intolerance Respiratory ROS: shortness of breath  Cardiovascular ROS: negative for - chest pain, dyspnea on exertion, edema or irregular heartbeat Gastrointestinal ROS: negative for - abdominal pain, diarrhea, hematemesis, nausea/vomiting or stool incontinence Genito-Urinary ROS: negative for - dysuria, hematuria, incontinence or urinary frequency/urgency Musculoskeletal ROS: negative for - joint swelling or muscular weakness  Neurological ROS: as noted in HPI Dermatological ROS: negative for rash and skin lesion changes  Physical Examination: Blood pressure 115/74, pulse 114, temperature 98.3 F (36.8 C), temperature source Oral, resp. rate 20, SpO2 100.00%.  Neurologic Examination Mental Status: Alert, oriented, thought content  appropriate.  Speech fluent without evidence of aphasia.  Able to follow 3 step commands without difficulty. Cranial Nerves: II: Discs flat bilaterally; Visual fields grossly normal, pupils equal, round, reactive to light and accommodation III,IV, VI: ptosis not present, extra-ocular motions intact bilaterally V,VII: smile symmetric, facial light touch sensation normal bilaterally VIII: hearing normal bilaterally IX,X: gag reflex present XI: bilateral shoulder shrug XII: midline tongue extension Motor: Right : Upper extremity   5/5    Left:     Upper extremity   5/5  Lower extremity   5/5     Lower extremity   5/5 Tone and bulk:normal tone throughout; no atrophy noted Sensory: Pinprick and light touch intact throughout, bilaterally Deep Tendon Reflexes: 2+ and symmetric throughout Plantars: Right: downgoing   Left: downgoing Cerebellar: normal finger-to-nose, normal rapid alternating movements and normal heel-to-shin test Gait: normal gait and station CV: pulses palpable throughout   Laboratory Studies:   Basic Metabolic Panel:  Lab 10/07/12 4782  NA 136  K 3.5  CL 103  CO2 22  GLUCOSE 85  BUN 4*  CREATININE 0.54  CALCIUM 8.2*  MG --  PHOS --    Liver Function Tests:  Lab 10/07/12 1200  AST 15  ALT 9  ALKPHOS 103  BILITOT 0.3  PROT 5.5*  ALBUMIN 2.7*   No results found for this basename: LIPASE:5,AMYLASE:5 in the last 168 hours No results found for this basename: AMMONIA:3 in the last 168 hours  CBC:  Lab 10/07/12 1200  WBC 12.8*  NEUTROABS --  HGB 8.6*  HCT 25.5*  MCV 80.2  PLT 164    Cardiac Enzymes: No results found for this basename: CKTOTAL:5,CKMB:5,CKMBINDEX:5,TROPONINI:5 in the last 168 hours  BNP: No components found with this basename: POCBNP:5  CBG: No results found for this basename: GLUCAP:5 in the last 168 hours  Microbiology: Results for orders placed during the hospital encounter of 06/09/10  URINE CULTURE     Status: Normal    Collection Time   06/09/10  4:27 PM      Component Value Range Status Comment   Specimen Description URINE, CLEAN CATCH   Final    Special Requests NONE   Final    Colony Count NO GROWTH   Final    Culture NO GROWTH   Final    Report Status 06/11/2010 FINAL   Final     Coagulation Studies: No results found for this basename: LABPROT:5,INR:5 in the last 72 hours  Urinalysis:  Lab 10/07/12 1200  COLORURINE YELLOW  LABSPEC 1.015  PHURINE 8.5*  GLUCOSEU NEGATIVE  HGBUR NEGATIVE  BILIRUBINUR NEGATIVE  KETONESUR 15*  PROTEINUR NEGATIVE  UROBILINOGEN 0.2  NITRITE NEGATIVE  LEUKOCYTESUR MODERATE*    Lipid Panel:  No results found for this basename: chol, trig, hdl, cholhdl, vldl, ldlcalc    HgbA1C:  No results found for this basename: HGBA1C    Urine Drug Screen:   No results found for this basename: labopia, cocainscrnur, labbenz, amphetmu, thcu, labbarb    Alcohol Level: No results found for this basename: ETH:2 in the last 168 hours  Imaging: No results found.   Assessment/Plan: 31 year old 30+ week pregnant female presenting with right upper extremity focal symptoms and difficulty  with speech, then development of headache.  Patient has had a similar episode prior to her pregnancy.  Now only complaint is that of headache.  MRI of the brain on review shows no acute abnormalities.  MRV was performed and reviewed as well and is unremarkable.  Symptoms and work up most consistent with a complicated migraine.    Recommendations: 1.  Analgesic treatment for headache.   2.  No further neurologic intervention is recommended at this time.  If further questions arise, please call or page at that time.  Thank you for allowing neurology to participate in the care of this patient.   Thana Farr, MD [facility] [phone] 10/07/2012, 3:07 PM

## 2012-10-07 NOTE — MAU Note (Signed)
Pt presents to MAU after having some right sided numbness and slurred speech this morning. While sitting in church she started feeling hot followed by difficulty speaking.  Pt was moved to a cooler more private room, where the fingers in her right hand began to go numb and tingly.  Husband noticed that her right hand was discolored and "blue."  He then brought her to hospital.  Pt now feeling slightly better, but still feels some difficulty breathing and is now seeing spots with some dizziness.  Denies any LOF or bleeding.  Reports good fetal movement.

## 2012-10-08 LAB — URINE CULTURE

## 2012-10-29 ENCOUNTER — Institutional Professional Consult (permissible substitution): Payer: BC Managed Care – PPO | Admitting: Cardiovascular Disease

## 2012-11-07 ENCOUNTER — Other Ambulatory Visit: Payer: Self-pay | Admitting: Obstetrics and Gynecology

## 2012-11-09 ENCOUNTER — Inpatient Hospital Stay (HOSPITAL_COMMUNITY)
Admission: AD | Admit: 2012-11-09 | Discharge: 2012-11-09 | Disposition: A | Discharge status: Home / Self Care | Payer: BC Managed Care – PPO | Source: Ambulatory Visit | Attending: Obstetrics and Gynecology | Admitting: Obstetrics and Gynecology

## 2012-11-09 DIAGNOSIS — O30009 Twin pregnancy, unspecified number of placenta and unspecified number of amniotic sacs, unspecified trimester: Secondary | ICD-10-CM | POA: Insufficient documentation

## 2012-11-09 NOTE — MAU Note (Signed)
Pt presents for twice weekly NST - twins.

## 2012-11-13 ENCOUNTER — Emergency Department (HOSPITAL_COMMUNITY)
Admission: EM | Admit: 2012-11-13 | Discharge: 2012-11-13 | Disposition: A | Discharge status: Home / Self Care | Payer: BC Managed Care – PPO | Attending: Emergency Medicine | Admitting: Emergency Medicine

## 2012-11-13 ENCOUNTER — Emergency Department (HOSPITAL_COMMUNITY): Payer: BC Managed Care – PPO

## 2012-11-13 ENCOUNTER — Encounter (HOSPITAL_COMMUNITY): Payer: Self-pay | Admitting: Emergency Medicine

## 2012-11-13 DIAGNOSIS — D72829 Elevated white blood cell count, unspecified: Secondary | ICD-10-CM | POA: Diagnosis present

## 2012-11-13 DIAGNOSIS — D649 Anemia, unspecified: Secondary | ICD-10-CM | POA: Diagnosis present

## 2012-11-13 DIAGNOSIS — Z8742 Personal history of other diseases of the female genital tract: Secondary | ICD-10-CM | POA: Insufficient documentation

## 2012-11-13 DIAGNOSIS — R079 Chest pain, unspecified: Secondary | ICD-10-CM | POA: Insufficient documentation

## 2012-11-13 DIAGNOSIS — O99891 Other specified diseases and conditions complicating pregnancy: Secondary | ICD-10-CM | POA: Insufficient documentation

## 2012-11-13 DIAGNOSIS — Z3490 Encounter for supervision of normal pregnancy, unspecified, unspecified trimester: Secondary | ICD-10-CM

## 2012-11-13 DIAGNOSIS — R000 Tachycardia, unspecified: Secondary | ICD-10-CM | POA: Diagnosis present

## 2012-11-13 DIAGNOSIS — E876 Hypokalemia: Secondary | ICD-10-CM | POA: Diagnosis present

## 2012-11-13 DIAGNOSIS — E039 Hypothyroidism, unspecified: Secondary | ICD-10-CM | POA: Insufficient documentation

## 2012-11-13 DIAGNOSIS — Z8719 Personal history of other diseases of the digestive system: Secondary | ICD-10-CM | POA: Insufficient documentation

## 2012-11-13 DIAGNOSIS — R002 Palpitations: Secondary | ICD-10-CM | POA: Insufficient documentation

## 2012-11-13 LAB — BASIC METABOLIC PANEL
BUN: 4 mg/dL — ABNORMAL LOW (ref 6–23)
Chloride: 102 mEq/L (ref 96–112)
GFR calc non Af Amer: >90 mL/min (ref >90)
Glucose, Bld: 107 mg/dL — ABNORMAL HIGH (ref 70–99)
Potassium: 3.3 mEq/L — ABNORMAL LOW (ref 3.5–5.1)

## 2012-11-13 LAB — URINE MICROSCOPIC-ADD ON

## 2012-11-13 LAB — DIFFERENTIAL
Eosinophils Relative: 2 % (ref 0–5)
Lymphocytes Relative: 10 % — ABNORMAL LOW (ref 12–46)
Lymphs Abs: 1.8 10*3/uL (ref 0.7–4.0)
Monocytes Relative: 8 % (ref 3–12)

## 2012-11-13 LAB — URINALYSIS, ROUTINE W REFLEX MICROSCOPIC
Bilirubin Urine: NEGATIVE
Ketones, ur: NEGATIVE mg/dL
Nitrite: NEGATIVE
Protein, ur: NEGATIVE mg/dL
Urobilinogen, UA: 0.2 mg/dL (ref 0.0–1.0)

## 2012-11-13 LAB — RAPID URINE DRUG SCREEN, HOSP PERFORMED
Barbiturates: NOT DETECTED
Benzodiazepines: NOT DETECTED
Cocaine: NOT DETECTED
Tetrahydrocannabinol: NOT DETECTED

## 2012-11-13 LAB — CBC
HCT: 30.6 % — ABNORMAL LOW (ref 36.0–46.0)
Hemoglobin: 9.7 g/dL — ABNORMAL LOW (ref 12.0–15.0)
MCHC: 31.7 g/dL (ref 30.0–36.0)
MCV: 76.3 fL — ABNORMAL LOW (ref 78.0–100.0)

## 2012-11-13 LAB — POCT I-STAT TROPONIN I

## 2012-11-13 MED ORDER — SODIUM CHLORIDE 0.9 % IV BOLUS (SEPSIS)
500.0000 mL | Freq: Once | INTRAVENOUS | Status: AC
  Administered 2012-11-13: 500 mL via INTRAVENOUS

## 2012-11-13 MED ORDER — IOHEXOL 350 MG/ML SOLN
100.0000 mL | Freq: Once | INTRAVENOUS | Status: AC | PRN
  Administered 2012-11-13: 100 mL via INTRAVENOUS

## 2012-11-13 NOTE — ED Notes (Signed)
Pt in CT at this time. Will get orthostatic vital signs when she returns.

## 2012-11-13 NOTE — ED Notes (Signed)
Pt denying any abdominal pain, or vaginal bleeding. Rapid response OB RN at bedside.

## 2012-11-13 NOTE — Progress Notes (Signed)
Dr. Ambrose Mantle called regarding EFM and pt status. Notified of Twin gestation with reactive NST times two and no contractions. Dr. Ambrose Mantle stated the pt was OB cleared and EFM could be discontinued. Updated ED MD regarding OB clearance.

## 2012-11-13 NOTE — ED Notes (Signed)
Rapid response OB RN at bedside.  

## 2012-11-13 NOTE — ED Provider Notes (Signed)
I saw and evaluated the patient, reviewed the resident's note and I agree with the findings and plan.  Derwood Kaplan, MD 11/13/12 1715

## 2012-11-13 NOTE — Consult Note (Signed)
Reason for Consult: Tachycardia/SOB  Requesting Physician: Sherron Monday, MD  HPI: This is a 31 y.o. female,  G1P0 at [redacted] weeks gestation, who presented to the ED today with a chief complaint of tachycardia and SOB. Pt reports that she has experienced these symptoms intermittently over the last several months, but states that her symptoms have progressively worsened. She experiences SOB with minimal exertion. She also endorses palpitations and feeling lightheaded and dizzy with these episodes. She denies syncope/presyncope. No chest pain, but had mild left sided chest tightness, earlier today, that has resolved.  She denies vaginal bleeding, melena and hematochezia. No fever, chills, diaphoresis, n/v. No leg pain/swelling or recent travel or prolonged periods of immobilization. Pt was placed on iron supplementation by her OB ~ 3 weeks ago. She states that she only takes her iron supplements every other day.  She reports maintaining adequate fluid intake.     Pt was recently seen in clinic by Dr. Alanda Amass on 10/09/12 for evaluation of her SOB. At that time, he ordered a 2D echo to assess LV function and to rule out the development of early peripartum cardiomyopathy. The echo revealed normal LV size and function with an EF >55% and mild mitral insufficiency.   PMHx:  Past Medical History  Diagnosis Date  . Celiac disease   . Thyroid disease   . Collagenous colitis   . Hypothyroidism   . Anemia     history  . Bartholin cyst 12/27/2011   Past Surgical History  Procedure Date  . Tonsillectomy   . Rhinoplasty   . Upper gastrointestinal endoscopy   . Colonoscopy   . Bartholin cyst marsupialization 12/28/2011    Procedure: BARTHOLIN CYST MARSUPIALIZATION;  Surgeon: Sherron Monday, MD;  Location: [facility];  Service: Gynecology;  Laterality: N/A;    FAMHx: History reviewed. No pertinent family history.  SOCHx:  reports that she has never smoked. She has never used smokeless tobacco. She reports that  she drinks alcohol. She reports that she does not use illicit drugs.  ALLERGIES: Allergies  Allergen Reactions  . Rifaximin Anaphylaxis  . Gluten Meal Other (See Comments)    Celiac's disease  . Levothyroxine Sodium     GI upset with brand name Synthroid; tolerates Unithroid.    ROS: Constitutional: negative for chills, fevers and night sweats Respiratory: positive for dyspnea on exertion Cardiovascular: positive for dyspnea and palpitations, negative for chest pain, near-syncope and syncope Gastrointestinal: negative for abdominal pain, constipation, diarrhea, melena, nausea and vomiting Hematologic/lymphatic: negative for bleeding Musculoskeletal:negative for muscle weakness   HOME MEDICATIONS:  See Med Rec  HOSPITAL MEDICATIONS: I have reviewed the patient's current medications.  VITALS: Blood pressure 99/60, pulse 109, temperature 98.5 F (36.9 C), temperature source Oral, resp. rate 16, height 5\' 6"  (1.676 m), weight 67.586 kg (149 lb), SpO2 100.00%.  PHYSICAL EXAM: General appearance: alert, cooperative and no distress, no increased work of breathing Neck: thyroid not enlarged, symmetric, no tenderness/mass/nodules Lungs: clear to auscultation bilaterally Heart: fast rate, regular rhythm, S1, S2 normal, no murmur, click, rub or gallop Extremities: No LEE Pulses: 2+ and symmetric Skin: warm and dry Neurologic: Grossly normal  LABS: Results for orders placed during the hospital encounter of 11/13/12 (from the past 48 hour(s))  CBC     Status: Abnormal   Collection Time   11/13/12 10:30 AM      Component Value Range Comment   WBC 17.9 (*) 4.0 - 10.5 K/uL    RBC 4.01  3.87 - 5.11 MIL/uL  Hemoglobin 9.7 (*) 12.0 - 15.0 g/dL    HCT 11.9 (*) 14.7 - 46.0 %    MCV 76.3 (*) 78.0 - 100.0 fL    MCH 24.2 (*) 26.0 - 34.0 pg    MCHC 31.7  30.0 - 36.0 g/dL    RDW 82.9  56.2 - 13.0 %    Platelets 222  150 - 400 K/uL   BASIC METABOLIC PANEL     Status: Abnormal    Collection Time   11/13/12 10:30 AM      Component Value Range Comment   Sodium 134 (*) 135 - 145 mEq/L    Potassium 3.3 (*) 3.5 - 5.1 mEq/L    Chloride 102  96 - 112 mEq/L    CO2 22  19 - 32 mEq/L    Glucose, Bld 107 (*) 70 - 99 mg/dL    BUN 4 (*) 6 - 23 mg/dL    Creatinine, Ser 8.65  0.50 - 1.10 mg/dL    Calcium 8.4  8.4 - 78.4 mg/dL    GFR calc non Af Amer >90  >90 mL/min    GFR calc Af Amer >90  >90 mL/min   PRO B NATRIURETIC PEPTIDE     Status: Normal   Collection Time   11/13/12 10:30 AM      Component Value Range Comment   Pro B Natriuretic peptide (BNP) 19.5  0 - 125 pg/mL   DIFFERENTIAL     Status: Abnormal   Collection Time   11/13/12 10:30 AM      Component Value Range Comment   Neutrophils Relative 80 (*) 43 - 77 %    Neutro Abs 13.8 (*) 1.7 - 7.7 K/uL    Lymphocytes Relative 10 (*) 12 - 46 %    Lymphs Abs 1.8  0.7 - 4.0 K/uL    Monocytes Relative 8  3 - 12 %    Monocytes Absolute 1.3 (*) 0.1 - 1.0 K/uL    Eosinophils Relative 2  0 - 5 %    Eosinophils Absolute 0.3  0.0 - 0.7 K/uL    Basophils Relative 0  0 - 1 %    Basophils Absolute 0.1  0.0 - 0.1 K/uL   URINE RAPID DRUG SCREEN (HOSP PERFORMED)     Status: Normal   Collection Time   11/13/12 10:47 AM      Component Value Range Comment   Opiates NONE DETECTED  NONE DETECTED    Cocaine NONE DETECTED  NONE DETECTED    Benzodiazepines NONE DETECTED  NONE DETECTED    Amphetamines NONE DETECTED  NONE DETECTED    Tetrahydrocannabinol NONE DETECTED  NONE DETECTED    Barbiturates NONE DETECTED  NONE DETECTED   URINALYSIS, ROUTINE W REFLEX MICROSCOPIC     Status: Abnormal   Collection Time   11/13/12 10:47 AM      Component Value Range Comment   Color, Urine YELLOW  YELLOW    APPearance CLEAR  CLEAR    Specific Gravity, Urine 1.018  1.005 - 1.030    pH 6.5  5.0 - 8.0    Glucose, UA NEGATIVE  NEGATIVE mg/dL    Hgb urine dipstick NEGATIVE  NEGATIVE    Bilirubin Urine NEGATIVE  NEGATIVE    Ketones, ur NEGATIVE   NEGATIVE mg/dL    Protein, ur NEGATIVE  NEGATIVE mg/dL    Urobilinogen, UA 0.2  0.0 - 1.0 mg/dL    Nitrite NEGATIVE  NEGATIVE    Leukocytes, UA SMALL (*)  NEGATIVE   URINE MICROSCOPIC-ADD ON     Status: Abnormal   Collection Time   11/13/12 10:47 AM      Component Value Range Comment   Squamous Epithelial / LPF FEW (*) RARE    WBC, UA 3-6  <3 WBC/hpf    RBC / HPF 0-2  <3 RBC/hpf    Bacteria, UA RARE  RARE   POCT I-STAT TROPONIN I     Status: Normal   Collection Time   11/13/12 10:50 AM      Component Value Range Comment   Troponin i, poc 0.01  0.00 - 0.08 ng/mL    Comment 3             EKG: Sinus Tachycardia 148  IMAGING: Dg Chest 1 View  11/13/2012  *RADIOLOGY REPORT*  Clinical Data: Tachycardia  CHEST - 1 VIEW  Comparison: None.  Findings: Lungs clear.  Heart size and pulmonary vascularity are normal.  No adenopathy.  No bone lesions.  IMPRESSION: No abnormality noted.   Original Report Authenticated By: Bretta Bang, M.D.     IMPRESSION: Principal Problem:  *Tachycardia Active Problems:  Pregnancy, 8 mos with twins  Leukocytosis, of unclear significance  Anemia  Hypokalemia   RECOMMENDATION: TSH pending. CT angiogram ordered by ER MD is pending. Replace K+. Cardiologist to follow.  Time Spent Directly with Patient: 30 minutes  KILROY,LUKE K 11/13/2012, 3:14 PM   I have seen and examined the patient along with Abelino Derrick, PA.  I have reviewed the chart, notes and new data.  I agree with PA's note.  Other than tachycardia, cardiovascular exam is normal. This is corroborated by recent normal echo. The ECGs show sinus tachycardia, consistent with an appropriate response to increased cardiac output demand. This may simply be related to twin pregnancy in a primagravida. Anemia, thyrotoxicosis should be considered, but there is no cardiovascular pathology that is identified. There is no true arrhythmia detected. No further cardiac evaluation is planned.    Thurmon Fair, MD, [facility] [phone] 11/13/2012, 6:17 PM

## 2012-11-13 NOTE — ED Notes (Signed)
Pt [redacted] weeks gestation with twins c/o increased SOB and tachycardia with activity x weeks that is worse today; pt tachycardic at present and unable to sit up due to SOB

## 2012-11-13 NOTE — Progress Notes (Signed)
Call from [facility] ED regarding pt from Dr. Emeline Darling office with c/o of tachycardia and S.O.B. OB RR RN in route.

## 2012-11-13 NOTE — Progress Notes (Signed)
At pt bedside. Pt has labored breathing and c/o of not being able to get comfortable. Abd soft and nontender, pt reports feeling "tightness with movement." Pt states positive fetal movement. Pt denies vaginal bleeding or leaking of fluid.

## 2012-11-13 NOTE — ED Provider Notes (Signed)
History     CSN: [id number]  Arrival date & time 11/13/12  4098   First MD Initiated Contact with Patient 11/13/12 1008      Chief Complaint  Patient presents with  . Tachycardia    (Consider location/radiation/quality/duration/timing/severity/associated sxs/prior treatment) HPI Comments: Worse with activity  Patient is a 31 y.o. female presenting with palpitations. The history is provided by the patient and the spouse.  Palpitations  This is a new problem. Episode onset: several months ago this began. Intermittent since then. The problem occurs daily. The problem has not changed since onset.The problem is associated with an unknown factor. Associated symptoms include chest pain. Pertinent negatives include no fever, no nausea, no dizziness and no weakness. Associated symptoms comments: Back pain. She has tried nothing for the symptoms. Risk factors: pregnancy. Her past medical history is significant for anemia (last Hgb 8.6). Her past medical history does not include hyperthyroidism.    Past Medical History  Diagnosis Date  . Celiac disease   . Thyroid disease   . Collagenous colitis   . Hypothyroidism   . Anemia     history  . Bartholin cyst 12/27/2011    Past Surgical History  Procedure Date  . Tonsillectomy   . Rhinoplasty   . Upper gastrointestinal endoscopy   . Colonoscopy   . Bartholin cyst marsupialization 12/28/2011    Procedure: BARTHOLIN CYST MARSUPIALIZATION;  Surgeon: Sherron Monday, MD;  Location: [facility];  Service: Gynecology;  Laterality: N/A;    History reviewed. No pertinent family history.  History  Substance Use Topics  . Smoking status: Never Smoker   . Smokeless tobacco: Never Used  . Alcohol Use: Yes     Comment: socially    OB History    Grav Para Term Preterm Abortions TAB SAB Ect Mult Living   1               Review of Systems  Constitutional: Negative for fever and chills.  Cardiovascular: Positive for chest pain and palpitations.   Gastrointestinal: Negative for nausea.  Neurological: Negative for dizziness and weakness.  All other systems reviewed and are negative.    Allergies  Rifaximin and Levothyroxine sodium  Home Medications   Current Outpatient Rx  Name  Route  Sig  Dispense  Refill  . ACETAMINOPHEN 500 MG PO TABS   Oral   Take 500 mg by mouth every 6 (six) hours as needed. For headaches and back pain.         Marland Kitchen FERROUS SULFATE 325 (65 FE) MG PO TABS   Oral   Take 1 tablet (325 mg total) by mouth 3 (three) times daily with meals.   90 tablet   11   . LEVOTHYROXINE SODIUM 125 MCG PO TABS   Oral   Take 125 mcg by mouth daily.           Marland Kitchen ZOFRAN PO   Oral   Take 1 tablet by mouth daily as needed. For nausea.         Marland Kitchen PRENATAL MULTIVITAMIN CH   Oral   Take 1 tablet by mouth daily.           There were no vitals taken for this visit.  Physical Exam  Nursing note and vitals reviewed. Constitutional: She is oriented to person, place, and time. She appears well-developed and well-nourished. No distress.  HENT:  Head: Normocephalic and atraumatic.  Eyes: EOM are normal. Pupils are equal, round, and reactive to light.  Neck: Normal range of motion. Neck supple.  Cardiovascular: Regular rhythm.  Tachycardia present.  Exam reveals no friction rub.   No murmur heard. Pulmonary/Chest: Effort normal and breath sounds normal. No respiratory distress. She has no wheezes. She has no rales.  Abdominal: Soft. She exhibits no distension. There is no tenderness. There is no rebound.       Gravid uterus, nontender, c/w 58 gestation with twins  Musculoskeletal: Normal range of motion. She exhibits no edema.  Neurological: She is alert and oriented to person, place, and time.  Skin: She is not diaphoretic.    ED Course  Procedures (including critical care time)  Labs Reviewed  CBC - Abnormal; Notable for the following:    WBC 17.9 (*)     Hemoglobin 9.7 (*)     HCT 30.6 (*)     MCV 76.3  (*)     MCH 24.2 (*)     All other components within normal limits  BASIC METABOLIC PANEL - Abnormal; Notable for the following:    Sodium 134 (*)     Potassium 3.3 (*)     Glucose, Bld 107 (*)     BUN 4 (*)     All other components within normal limits  URINALYSIS, ROUTINE W REFLEX MICROSCOPIC - Abnormal; Notable for the following:    Leukocytes, UA SMALL (*)     All other components within normal limits  URINE MICROSCOPIC-ADD ON - Abnormal; Notable for the following:    Squamous Epithelial / LPF FEW (*)     All other components within normal limits  DIFFERENTIAL - Abnormal; Notable for the following:    Neutrophils Relative 80 (*)     Neutro Abs 13.8 (*)     Lymphocytes Relative 10 (*)     Monocytes Absolute 1.3 (*)     All other components within normal limits  PRO B NATRIURETIC PEPTIDE  URINE RAPID DRUG SCREEN (HOSP PERFORMED)  POCT I-STAT TROPONIN I  TSH  URINE CULTURE   Dg Chest 1 View  11/13/2012  *RADIOLOGY REPORT*  Clinical Data: Tachycardia  CHEST - 1 VIEW  Comparison: None.  Findings: Lungs clear.  Heart size and pulmonary vascularity are normal.  No adenopathy.  No bone lesions.  IMPRESSION: No abnormality noted.   Original Report Authenticated By: Bretta Bang, M.D.    Ct Angio Chest Pe W/cm &/or Wo Cm  11/13/2012  *RADIOLOGY REPORT*  Clinical Data: [redacted] weeks pregnant.  Shortness of breath.  CT ANGIOGRAPHY CHEST  Technique:  Multidetector CT imaging of the chest using the standard protocol during bolus administration of intravenous contrast. Multiplanar reconstructed images including MIPs were obtained and reviewed to evaluate the vascular anatomy.  Contrast: OMNIPAQUE IOHEXOL 350 MG/ML SOLN  Comparison: None.  Findings: No filling defects in the pulmonary arteries to suggest pulmonary emboli. Heart is normal size. Aorta is normal caliber. No mediastinal, hilar, or axillary adenopathy.  Visualized thyroid and chest wall soft tissues unremarkable.  No acute bony  abnormality. Lungs are clear.  No focal airspace opacities or suspicious nodules.  No effusions.  IMPRESSION: No evidence of pulmonary embolus.  No acute findings.   Original Report Authenticated By: Charlett Nose, M.D.      1. Tachycardia      Date: 11/13/2012  Rate: 149  Rhythm: sinus tachycardia  QRS Axis: normal  Intervals: normal  ST/T Wave abnormalities: nonspecific ST/T changes  Conduction Disutrbances:none  Narrative Interpretation: No concern for WPW  Old EKG Reviewed: none  available    MDM   The patient is a 31 year old female at [redacted] weeks gestation with twins who presents with tachycardia. Patient states multiple of the tachycardia with associated shortness of breath throughout the past several months. She states she has been evaluated for this, however her heart was never elevated. She was seen by her obstetrician earlier this morning and sent here for further evaluation. [facility] cardiology was called to see the patient prior to arrival and are on their way now. Patient here reports some shortness of breath, mild chest pain. She is normotensive at 101/60 and tachycardic with a rate initially the 40s. On reexam while the OB nurses working her up to the tocometer, her heart is in the 120s. Denies any vaginal bleeding, gush of fluid. States she is feeling the babies move and having tightness feelings that she calls contractions. She has been seen by obstetrics and by neurology for some episodes which she calls "strokes." These episodes include right-sided numbness normal aphasia, difficulty thinking, amnesia. Has been worked up by neurology in this was labeled as a complicated migraine. MRI showed no signs of stroke.  Pregnancy, concern for possible PE with her shortness of breath. Will order troponin BNP at this time. Also order CBC, UDS, UA.  Labs unremarkable. Hgb improved. No overt sign for her tachycardia. After much discussion and weighing the risks and benefits, patient  consented to a PE scan, which was negative. Patient's tachycardia likely due to anxiety. Discharged home, instructed to f/u with OB in the next 1-2 days.   Elwin Mocha, MD 11/13/12 [id number]

## 2012-11-13 NOTE — ED Provider Notes (Signed)
I saw and evaluated the patient, reviewed the resident's note and I agree with the findings and plan.  Pt is pregnant, 3rd trimester, comes in with cc of tachycardia, and associated SOB. Sent by OB GYNE. Tachycardia, sinus - upto 150s. No hx of illicits, no hx of anxiety. Bedside US, no right sided heart strain. After discussing the case with Radiologist, we got a CT PE. Before the study, we discussed the reason for testing with the family, and the risks, benefits of the test. They approoved the study. Results are negative for PE. Sinus tachycardia, unknown cause, likely multifactorial and pregnancy related.   Derwood Kaplan, MD 11/13/12 1702

## 2012-11-14 LAB — URINE CULTURE
Colony Count: NO GROWTH
Culture: NO GROWTH

## 2012-11-28 ENCOUNTER — Telehealth (HOSPITAL_COMMUNITY): Payer: Self-pay | Admitting: *Deleted

## 2012-11-28 ENCOUNTER — Encounter (HOSPITAL_COMMUNITY): Payer: Self-pay | Admitting: *Deleted

## 2012-11-28 NOTE — Telephone Encounter (Signed)
Preadmission screen  

## 2012-12-01 ENCOUNTER — Encounter (HOSPITAL_COMMUNITY): Payer: Self-pay

## 2012-12-01 DIAGNOSIS — O30049 Twin pregnancy, dichorionic/diamniotic, unspecified trimester: Secondary | ICD-10-CM

## 2012-12-01 HISTORY — DX: Twin pregnancy, dichorionic/diamniotic, unspecified trimester: O30.049

## 2012-12-01 NOTE — H&P (Signed)
Emily [name] is a 31 y.o. female G1P0 at 41+ with twin IUP, vtx/vtx.  Di/Di twins.  Pregnancy complicated by atypical migraine, SOB, heart palpitations.  Nl cardiac echo. Nl head MRI and neuro eval.  Pt also with hypothyroidism.  +FM x 2, no LOF, no VB, occ ctx.  Pregnancy followed with growth [place], Most recent vtx/vtx, 13% discordant Maternal Medical History:  Contractions: Frequency: irregular.    Fetal activity: Perceived fetal activity is normal.   Last perceived fetal movement was within the past hour.      OB History    Grav Para Term Preterm Abortions TAB SAB Ect Mult Living   1             G1 present, di/di twins; no abn pap, no STD, s/p Bartholin's gland marsupilization. Past Medical History  Diagnosis Date  . Celiac disease   . Thyroid disease   . Collagenous colitis   . Hypothyroidism   . Anemia     history  . Bartholin cyst 12/27/2011  . Infertility, female   . Headache     complex migraines with cardiac and neuro symptoms during pregnancy  . Dichorionic diamniotic twin pregnancy 12/01/2012   Past Surgical History  Procedure Date  . Tonsillectomy   . Rhinoplasty   . Upper gastrointestinal endoscopy   . Colonoscopy   . Bartholin cyst marsupialization 12/28/2011    Procedure: BARTHOLIN CYST MARSUPIALIZATION;  Surgeon: Sherron Monday, MD;  Location: [facility];  Service: Gynecology;  Laterality: N/A;   Family History: family history is not on file. Social History:  reports that she has never smoked. She has never used smokeless tobacco. She reports that she drinks alcohol. She reports that she does not use illicit drugs. married Meds Unithroid, PNV All synthroid, xifaxan   Prenatal Transfer Tool  Maternal Diabetes: No Genetic Screening: Normal Maternal Ultrasounds/Referrals: Normal Fetal Ultrasounds or other Referrals:  None Maternal Substance Abuse:  No Significant Maternal Medications:  Meds include: Other: Unithroid Significant Maternal Lab Results:  Lab values  include: Group B Strep negative Other Comments:  Di/Di; vtx/vtx, 13% discordant, female, female  Review of Systems  Constitutional: Negative.   HENT: Negative.   Eyes: Negative.   Respiratory: Negative.   Cardiovascular: Negative.   Gastrointestinal: Negative.   Genitourinary: Negative.   Musculoskeletal: Negative.   Skin: Negative.   Neurological: Negative.   Psychiatric/Behavioral: Negative.       There were no vitals taken for this visit. Maternal Exam:  Uterine Assessment: Contraction frequency is irregular.   Abdomen: Fundal height is appropriate for gestation - 42cm.   Fetal presentation: vertex  Introitus: Normal vulva. Normal vagina.  Pelvis: adequate for delivery.   Cervix: Cervix evaluated by digital exam.     Physical Exam  Constitutional: She is oriented to person, place, and time. She appears well-developed and well-nourished.  HENT:  Head: Normocephalic and atraumatic.  Cardiovascular: Normal rate and regular rhythm.   Respiratory: Effort normal and breath sounds normal. No respiratory distress.  GI: Soft. Bowel sounds are normal. There is no tenderness.  Musculoskeletal: Normal range of motion.  Neurological: She is alert and oriented to person, place, and time.  Skin: Skin is warm and dry.  Psychiatric: She has a normal mood and affect. Her behavior is normal.    Prenatal labs: ABO, Rh: B/Positive/-- (06/17 0000) Antibody: Negative (06/17 0000) Rubella: Immune (06/17 0000) RPR: Nonreactive (06/17 0000)  HBsAg: Negative (06/17 0000)  HIV: Non-reactive (06/17 0000)  GBS: Negative (12/10 0000)  Hgb 12.0/ Pap WNL/ UrCx neg/ Plt 201K/ GC neg/ Chl neg/ glucola 81/ First Tri Scr WNL/AFP WNL  [place] First trimester, cwd, [facility] 12/23/12 Followed q 3-4 week, nl groeth A nl anat, female, good growth; B nl anat, female good growth, ant plac x 2  Recently Vtx/Vtx, 13% growth Tdap/flu 10/24 Atypical migraine - neg MRI,neg neuro w/u SOB, heart palpitation - nl  echo, neg cardio  Assessment/Plan: 31yo G1P0 Di/Di, vtx/vtx twins for iol gbbs negative Epidural for pian Expect SVD, d/w pt r/b/a of delivery route  BOVARD,Emily [name] 12/01/2012, 10:13 PM

## 2012-12-02 ENCOUNTER — Inpatient Hospital Stay (HOSPITAL_COMMUNITY): Payer: BC Managed Care – PPO | Admitting: Anesthesiology

## 2012-12-02 ENCOUNTER — Encounter (HOSPITAL_COMMUNITY): Payer: Self-pay | Admitting: Anesthesiology

## 2012-12-02 ENCOUNTER — Encounter (HOSPITAL_COMMUNITY): Payer: Self-pay

## 2012-12-02 ENCOUNTER — Inpatient Hospital Stay (HOSPITAL_COMMUNITY)
Admission: RE | Admit: 2012-12-02 | Discharge: 2012-12-04 | DRG: 373 | Disposition: A | Discharge status: Home / Self Care | Payer: BC Managed Care – PPO | Source: Ambulatory Visit | Attending: Obstetrics and Gynecology | Admitting: Obstetrics and Gynecology

## 2012-12-02 VITALS — BP 97/61 | HR 89 | Temp 98.1°F | Resp 18 | Ht 66.0 in | Wt 154.0 lb

## 2012-12-02 DIAGNOSIS — O99284 Endocrine, nutritional and metabolic diseases complicating childbirth: Secondary | ICD-10-CM | POA: Diagnosis present

## 2012-12-02 DIAGNOSIS — E039 Hypothyroidism, unspecified: Secondary | ICD-10-CM | POA: Diagnosis present

## 2012-12-02 DIAGNOSIS — O30049 Twin pregnancy, dichorionic/diamniotic, unspecified trimester: Secondary | ICD-10-CM

## 2012-12-02 DIAGNOSIS — O99892 Other specified diseases and conditions complicating childbirth: Secondary | ICD-10-CM | POA: Diagnosis present

## 2012-12-02 DIAGNOSIS — E079 Disorder of thyroid, unspecified: Secondary | ICD-10-CM | POA: Diagnosis present

## 2012-12-02 DIAGNOSIS — O30009 Twin pregnancy, unspecified number of placenta and unspecified number of amniotic sacs, unspecified trimester: Secondary | ICD-10-CM | POA: Diagnosis present

## 2012-12-02 HISTORY — DX: Twin pregnancy, dichorionic/diamniotic, unspecified trimester: O30.049

## 2012-12-02 LAB — CBC
Hemoglobin: 9.7 g/dL — ABNORMAL LOW (ref 12.0–15.0)
MCV: 75.2 fL — ABNORMAL LOW (ref 78.0–100.0)
Platelets: 221 10*3/uL (ref 150–400)
RBC: 4.08 MIL/uL (ref 3.87–5.11)
WBC: 13.8 10*3/uL — ABNORMAL HIGH (ref 4.0–10.5)

## 2012-12-02 LAB — PREPARE RBC (CROSSMATCH)

## 2012-12-02 LAB — ABO/RH: ABO/RH(D): B POS

## 2012-12-02 MED ORDER — SODIUM BICARBONATE 8.4 % IV SOLN
INTRAVENOUS | Status: DC | PRN
  Administered 2012-12-02: 5 mL via EPIDURAL

## 2012-12-02 MED ORDER — SIMETHICONE 80 MG PO CHEW: 80.0000 mg | CHEWABLE_TABLET | ORAL | Status: DC | PRN

## 2012-12-02 MED ORDER — CYCLOBENZAPRINE HCL 10 MG PO TABS
10.0000 mg | ORAL_TABLET | Freq: Every day | ORAL | Status: DC | PRN
  Filled 2012-12-02: qty 1

## 2012-12-02 MED ORDER — ONDANSETRON HCL 4 MG PO TABS: 4.0000 mg | ORAL_TABLET | ORAL | Status: DC | PRN

## 2012-12-02 MED ORDER — LACTATED RINGERS IV SOLN: 500.0000 mL | Freq: Once | INTRAVENOUS | Status: DC

## 2012-12-02 MED ORDER — BUTORPHANOL TARTRATE 1 MG/ML IJ SOLN: 2.0000 mg | INTRAMUSCULAR | Status: DC | PRN

## 2012-12-02 MED ORDER — CITRIC ACID-SODIUM CITRATE 334-500 MG/5ML PO SOLN: 30.0000 mL | ORAL | Status: DC | PRN

## 2012-12-02 MED ORDER — WITCH HAZEL-GLYCERIN EX PADS
1.0000 "application " | MEDICATED_PAD | CUTANEOUS | Status: DC | PRN
  Administered 2012-12-03: 1 via TOPICAL

## 2012-12-02 MED ORDER — EPHEDRINE 5 MG/ML INJ
10.0000 mg | INTRAVENOUS | Status: DC | PRN
  Filled 2012-12-02: qty 4

## 2012-12-02 MED ORDER — OXYTOCIN 40 UNITS IN LACTATED RINGERS INFUSION - SIMPLE MED
1.0000 m[IU]/min | INTRAVENOUS | Status: DC
  Administered 2012-12-02: 2 m[IU]/min via INTRAVENOUS
  Filled 2012-12-02: qty 1000

## 2012-12-02 MED ORDER — OXYCODONE-ACETAMINOPHEN 5-325 MG PO TABS
1.0000 | ORAL_TABLET | ORAL | Status: DC | PRN
  Filled 2012-12-02: qty 1

## 2012-12-02 MED ORDER — IBUPROFEN 600 MG PO TABS: 600.0000 mg | ORAL_TABLET | Freq: Four times a day (QID) | ORAL | Status: DC | PRN

## 2012-12-02 MED ORDER — TERBUTALINE SULFATE 1 MG/ML IJ SOLN: 0.2500 mg | Freq: Once | INTRAMUSCULAR | Status: DC | PRN

## 2012-12-02 MED ORDER — PRENATAL MULTIVITAMIN CH
1.0000 | ORAL_TABLET | Freq: Every day | ORAL | Status: DC
  Filled 2012-12-02 (×2): qty 1

## 2012-12-02 MED ORDER — SENNOSIDES-DOCUSATE SODIUM 8.6-50 MG PO TABS: 2.0000 | ORAL_TABLET | Freq: Every day | ORAL | Status: DC

## 2012-12-02 MED ORDER — FENTANYL 2.5 MCG/ML BUPIVACAINE 1/10 % EPIDURAL INFUSION (WH - ANES)
INTRAMUSCULAR | Status: DC | PRN
  Administered 2012-12-02: 14 mL/h via EPIDURAL

## 2012-12-02 MED ORDER — LIDOCAINE HCL (PF) 1 % IJ SOLN
30.0000 mL | INTRAMUSCULAR | Status: DC | PRN
  Filled 2012-12-02: qty 30

## 2012-12-02 MED ORDER — PHENYLEPHRINE 40 MCG/ML (10ML) SYRINGE FOR IV PUSH (FOR BLOOD PRESSURE SUPPORT)
80.0000 ug | PREFILLED_SYRINGE | INTRAVENOUS | Status: DC | PRN
  Administered 2012-12-02: 80 ug via INTRAVENOUS
  Filled 2012-12-02: qty 5

## 2012-12-02 MED ORDER — ONDANSETRON HCL 4 MG/2ML IJ SOLN: 4.0000 mg | INTRAMUSCULAR | Status: DC | PRN

## 2012-12-02 MED ORDER — ONDANSETRON HCL 4 MG/2ML IJ SOLN
4.0000 mg | Freq: Four times a day (QID) | INTRAMUSCULAR | Status: DC | PRN
  Administered 2012-12-02: 4 mg via INTRAVENOUS
  Filled 2012-12-02 (×2): qty 2

## 2012-12-02 MED ORDER — PHENYLEPHRINE 40 MCG/ML (10ML) SYRINGE FOR IV PUSH (FOR BLOOD PRESSURE SUPPORT): 80.0000 ug | PREFILLED_SYRINGE | INTRAVENOUS | Status: DC | PRN

## 2012-12-02 MED ORDER — IBUPROFEN 600 MG PO TABS
600.0000 mg | ORAL_TABLET | Freq: Four times a day (QID) | ORAL | Status: DC
  Administered 2012-12-02 – 2012-12-04 (×8): 600 mg via ORAL
  Filled 2012-12-02 (×9): qty 1

## 2012-12-02 MED ORDER — FENTANYL 2.5 MCG/ML BUPIVACAINE 1/10 % EPIDURAL INFUSION (WH - ANES)
14.0000 mL/h | INTRAMUSCULAR | Status: DC
  Filled 2012-12-02: qty 125

## 2012-12-02 MED ORDER — ONDANSETRON HCL 4 MG PO TABS: 4.0000 mg | ORAL_TABLET | Freq: Three times a day (TID) | ORAL | Status: DC | PRN

## 2012-12-02 MED ORDER — LACTATED RINGERS IV BOLUS (SEPSIS)
1000.0000 mL | Freq: Once | INTRAVENOUS | Status: AC
  Administered 2012-12-02: 1000 mL via INTRAVENOUS

## 2012-12-02 MED ORDER — PRENATAL MULTIVITAMIN CH
1.0000 | ORAL_TABLET | Freq: Every day | ORAL | Status: DC
  Filled 2012-12-02: qty 1

## 2012-12-02 MED ORDER — LEVOTHYROXINE SODIUM 125 MCG PO TABS
125.0000 ug | ORAL_TABLET | Freq: Every day | ORAL | Status: DC
  Filled 2012-12-02 (×3): qty 1

## 2012-12-02 MED ORDER — TETANUS-DIPHTH-ACELL PERTUSSIS 5-2.5-18.5 LF-MCG/0.5 IM SUSP: 0.5000 mL | Freq: Once | INTRAMUSCULAR | Status: DC

## 2012-12-02 MED ORDER — OXYTOCIN BOLUS FROM INFUSION: 500.0000 mL | INTRAVENOUS | Status: DC

## 2012-12-02 MED ORDER — DIPHENHYDRAMINE HCL 25 MG PO CAPS
25.0000 mg | ORAL_CAPSULE | Freq: Four times a day (QID) | ORAL | Status: DC | PRN
  Filled 2012-12-02 (×2): qty 1

## 2012-12-02 MED ORDER — ACETAMINOPHEN 325 MG PO TABS: 650.0000 mg | ORAL_TABLET | ORAL | Status: DC | PRN

## 2012-12-02 MED ORDER — LACTATED RINGERS IV SOLN: INTRAVENOUS | Status: DC

## 2012-12-02 MED ORDER — DIPHENHYDRAMINE HCL 50 MG/ML IJ SOLN: 12.5000 mg | INTRAMUSCULAR | Status: DC | PRN

## 2012-12-02 MED ORDER — BENZOCAINE-MENTHOL 20-0.5 % EX AERO
1.0000 "application " | INHALATION_SPRAY | CUTANEOUS | Status: DC | PRN
  Administered 2012-12-03: 1 via TOPICAL
  Filled 2012-12-02: qty 56

## 2012-12-02 MED ORDER — FERROUS SULFATE 325 (65 FE) MG PO TABS
325.0000 mg | ORAL_TABLET | Freq: Every day | ORAL | Status: DC
  Administered 2012-12-03 – 2012-12-04 (×2): 325 mg via ORAL
  Filled 2012-12-02 (×2): qty 1

## 2012-12-02 MED ORDER — LACTATED RINGERS IV SOLN
INTRAVENOUS | Status: DC
  Administered 2012-12-02 (×3): via INTRAVENOUS

## 2012-12-02 MED ORDER — LACTATED RINGERS IV SOLN: 500.0000 mL | INTRAVENOUS | Status: DC | PRN

## 2012-12-02 MED ORDER — ZOLPIDEM TARTRATE 5 MG PO TABS: 5.0000 mg | ORAL_TABLET | Freq: Every evening | ORAL | Status: DC | PRN

## 2012-12-02 MED ORDER — LANOLIN HYDROUS EX OINT: TOPICAL_OINTMENT | CUTANEOUS | Status: DC | PRN

## 2012-12-02 MED ORDER — OXYCODONE-ACETAMINOPHEN 5-325 MG PO TABS: 1.0000 | ORAL_TABLET | ORAL | Status: DC | PRN

## 2012-12-02 MED ORDER — OXYTOCIN 40 UNITS IN LACTATED RINGERS INFUSION - SIMPLE MED: 62.5000 mL/h | INTRAVENOUS | Status: DC

## 2012-12-02 MED ORDER — EPHEDRINE 5 MG/ML INJ: 10.0000 mg | INTRAVENOUS | Status: DC | PRN

## 2012-12-02 MED ORDER — DIBUCAINE 1 % RE OINT
1.0000 "application " | TOPICAL_OINTMENT | RECTAL | Status: DC | PRN
  Administered 2012-12-03: 1 via RECTAL
  Filled 2012-12-02: qty 28

## 2012-12-02 NOTE — Anesthesia Preprocedure Evaluation (Signed)

## 2012-12-02 NOTE — Consult Note (Signed)
Neonatology Note:  Attendance at Code Apgar:  Our team responded to a Code Apgar call to room # 165 following NSVD of Twin B; infant with brief period of apnea just after delivery. The mother is a G1P0 B pos, GBS neg with twin gestation (Di-Di, vertex, vertex) at 37 2/7 weeks. Mother also has hypothyroidism and celiac disease. ROM occurred 7 hours PTD and the fluid was clear. At delivery, the baby was very slow to breathe. The OB nursing staff in attendance gave vigorous stimulation and a Code Apgar was called. Our team arrived at 1.5 minutes of life, at which time the baby was crying weakly and appeared slightly dusky. We continued to give stimulation as the baby had some periodic breathing, which improved steadily over the first 10 min of life. By 15 minutes, the baby was entirely normal with pink color, good perfusion and no resp distress.Ap was 9 by 5 minutes (OB RN to assign 1-min Apgar score). I spoke with the parents in the DR, allowed baby to remain with mother for skin to skin time, and transferred the baby to the Pediatrician's care.  Parlee Amescua C. Donnis Phaneuf, MD  

## 2012-12-02 NOTE — Anesthesia Procedure Notes (Signed)

## 2012-12-02 NOTE — Progress Notes (Signed)
Patient ID: Emily [name], female   DOB: [birth year], 31 y.o.   MRN: [id number]  AROM for clear fluid, w/o difficulty/complication SVE 4/90/0 FSE applied  FHTs 150's x 2; mod var toco irr  Will start pitocin if no cervical change in 1 hour Expect SVD x 2 Bedside US - vtx/vtx

## 2012-12-02 NOTE — Progress Notes (Signed)
Patient ID: Emily [name], female   DOB: 01-30-1981, 31 y.o.   MRN: [id number]  Comfortable with epidural.  Some pressure  AFVSS gen NAD FHTs 145 mod var, with early decel category 2 A; 120, mod var, category 1 B toco q 2-71min  Pushing with good effort, slow progress  Anticipate SVD

## 2012-12-03 ENCOUNTER — Encounter (HOSPITAL_COMMUNITY): Payer: Self-pay

## 2012-12-03 LAB — RPR: RPR Ser Ql: NONREACTIVE

## 2012-12-03 LAB — CBC
HCT: 21.3 % — ABNORMAL LOW (ref 36.0–46.0)
MCH: 23.8 pg — ABNORMAL LOW (ref 26.0–34.0)
MCHC: 31.5 g/dL (ref 30.0–36.0)
MCV: 75.5 fL — ABNORMAL LOW (ref 78.0–100.0)
RDW: 16.4 % — ABNORMAL HIGH (ref 11.5–15.5)

## 2012-12-03 MED ORDER — ACETAMINOPHEN 325 MG PO TABS
650.0000 mg | ORAL_TABLET | Freq: Once | ORAL | Status: AC
  Administered 2012-12-03: 650 mg via ORAL
  Filled 2012-12-03: qty 1

## 2012-12-03 MED ORDER — DIPHENHYDRAMINE HCL 25 MG PO CAPS
25.0000 mg | ORAL_CAPSULE | Freq: Once | ORAL | Status: AC
  Administered 2012-12-03: 25 mg via ORAL

## 2012-12-03 NOTE — Progress Notes (Signed)
Post Partum Day 1 Subjective: voiding, tolerating PO and some lightheadedness, no LOC.  nl lochia, some clots.  pain controlled.    Objective: Blood pressure 101/68, pulse 99, temperature 98.4 F (36.9 C), temperature source Oral, resp. rate 18, height 5\' 6"  (1.676 m), weight 69.854 kg (154 lb), SpO2 98.00%, unknown if currently breastfeeding.  Physical Exam:  General: alert and no distress, pale Lochia: appropriate Uterine Fundus: firm   Basename 12/03/12 0455 12/02/12 2130  HGB 6.7* 7.2*  HCT 21.3* 22.1*  predelivery Hgb = 9.5  Assessment/Plan: Plan for discharge tomorrow, Breastfeeding and Lactation consult.  D/w pt possibility of blood transfusion.  D/w pt r/b/a.  Will consider.     LOS: 1 day   BOVARD,Liany Mumpower 12/03/2012, 8:57 AM

## 2012-12-03 NOTE — Progress Notes (Signed)
Patient states she feels "puney" and also dizzy while lying in bed. BP 91/57, lochia scant to small. Dr. Ellyn Hack paged. Orders received for lab and fluid bolus. Will continue to closely monitor patient.

## 2012-12-03 NOTE — Progress Notes (Signed)
Received call from lab of critical hemoglobin of 6.7. Patient continues to c/o dizziness with ambulation. Dr. Ellyn Hack paged and made aware. Will obtain orthostatic VS this AM and continue to closely monitor patient.

## 2012-12-04 LAB — TYPE AND SCREEN: Unit division: 0

## 2012-12-04 LAB — CBC
HCT: 25.8 % — ABNORMAL LOW (ref 36.0–46.0)
MCH: 25.8 pg — ABNORMAL LOW (ref 26.0–34.0)
MCHC: 32.6 g/dL (ref 30.0–36.0)
MCV: 79.1 fL (ref 78.0–100.0)
RDW: 17.1 % — ABNORMAL HIGH (ref 11.5–15.5)

## 2012-12-04 NOTE — Discharge Summary (Signed)
Obstetric Discharge Summary Reason for Admission: induction of labor Prenatal Procedures: NST and ultrasound Intrapartum Procedures: spontaneous vaginal delivery Postpartum Procedures: transfusion 2 units PRBC Complications-Operative and Postpartum: 2nd degree perineal laceration Hemoglobin  Date Value Range Status  12/04/2012 8.4* 12.0 - 15.0 g/dL Final     DELTA CHECK NOTED     REPEATED TO VERIFY     HCT  Date Value Range Status  12/04/2012 25.8* 36.0 - 46.0 % Final    Physical Exam:  General: alert Lochia: appropriate Uterine Fundus: firm  Discharge Diagnoses: Di/di twin pregnancy at term, delivered  Discharge Information: Date: 12/04/2012 Activity: pelvic rest Diet: routine Medications: Ibuprofen Condition: stable Instructions: refer to practice specific booklet Discharge to: home Follow-up Information    Follow up with BOVARD,JODY, MD. Schedule an appointment as soon as possible for a visit in 6 weeks.   Contact information:   [address] [place] [address] [phone]          Newborn Data:   IDANIA, DESOUZA [[id number]]  Live born female  Birth Weight: 6 lb 2.9 oz (2804 g) APGAR: 9, 9   RICKEYA, MANUS [[id number]]  Live born female  Birth Weight: 4 lb 7.8 oz (2036 g) APGAR: 8, 9   Babies may be admitted to room.  Persia Lintner D 12/04/2012, 9:29 AM

## 2012-12-04 NOTE — Progress Notes (Signed)
PPD #2 Feels better after transfusion of 2 units PRBC Afeb, VSS Fundus firm D/c home, babies may be admitted

## 2012-12-05 ENCOUNTER — Encounter (HOSPITAL_COMMUNITY)
Admission: RE | Admit: 2012-12-05 | Discharge: 2012-12-05 | Disposition: A | Discharge status: Home / Self Care | Payer: BC Managed Care – PPO | Source: Ambulatory Visit | Attending: Obstetrics and Gynecology | Admitting: Obstetrics and Gynecology

## 2012-12-05 DIAGNOSIS — O923 Agalactia: Secondary | ICD-10-CM | POA: Insufficient documentation

## 2012-12-06 ENCOUNTER — Encounter (HOSPITAL_COMMUNITY): Payer: Self-pay

## 2012-12-06 NOTE — Anesthesia Postprocedure Evaluation (Signed)
  Anesthesia Post-op Note  Patient: Emily [name] This patient has recovered from her labor epidural, and I am not aware of any complications or problems.

## 2012-12-06 NOTE — Addendum Note (Signed)
Addendum  created 12/06/12 1028 by Jiles Garter, MD   Modules edited:Notes Section

## 2012-12-18 ENCOUNTER — Ambulatory Visit (HOSPITAL_COMMUNITY): Payer: BC Managed Care – PPO

## 2014-05-12 ENCOUNTER — Other Ambulatory Visit: Payer: Self-pay | Admitting: Gastroenterology

## 2014-10-13 ENCOUNTER — Encounter (HOSPITAL_COMMUNITY): Payer: Self-pay

## 2015-01-11 ENCOUNTER — Inpatient Hospital Stay (HOSPITAL_COMMUNITY)
Admission: AD | Admit: 2015-01-11 | Discharge: 2015-01-11 | Disposition: A | Discharge status: Home / Self Care | Payer: 59 | Source: Ambulatory Visit | Attending: Obstetrics and Gynecology | Admitting: Obstetrics and Gynecology

## 2015-01-11 ENCOUNTER — Encounter (HOSPITAL_COMMUNITY): Payer: Self-pay | Admitting: *Deleted

## 2015-01-11 ENCOUNTER — Inpatient Hospital Stay (HOSPITAL_COMMUNITY): Payer: 59

## 2015-01-11 DIAGNOSIS — O209 Hemorrhage in early pregnancy, unspecified: Secondary | ICD-10-CM | POA: Diagnosis present

## 2015-01-11 DIAGNOSIS — O4691 Antepartum hemorrhage, unspecified, first trimester: Secondary | ICD-10-CM

## 2015-01-11 DIAGNOSIS — Z3A01 Less than 8 weeks gestation of pregnancy: Secondary | ICD-10-CM | POA: Diagnosis not present

## 2015-01-11 LAB — CBC
HCT: 35 % — ABNORMAL LOW (ref 36.0–46.0)
Hemoglobin: 12.4 g/dL (ref 12.0–15.0)
MCH: 30.4 pg (ref 26.0–34.0)
MCHC: 35.4 g/dL (ref 30.0–36.0)
MCV: 85.8 fL (ref 78.0–100.0)
PLATELETS: 177 10*3/uL (ref 150–400)
RBC: 4.08 MIL/uL (ref 3.87–5.11)
RDW: 11.8 % (ref 11.5–15.5)
WBC: 5.6 10*3/uL (ref 4.0–10.5)

## 2015-01-11 LAB — HCG, QUANTITATIVE, PREGNANCY: hCG, Beta Chain, Quant, S: 12665 m[IU]/mL — ABNORMAL HIGH (ref <5)

## 2015-01-11 LAB — ABO/RH: ABO/RH(D): B POS

## 2015-01-11 NOTE — MAU Provider Note (Signed)
History     CSN: [id number]  Arrival date and time: 01/11/15 1857   First Provider Initiated Contact with Patient 01/11/15 1943      Chief Complaint  Patient presents with  . Vaginal Bleeding   HPI Emily [name] 34 y.o. 1857w5d   Came to MAU due to vaginal bleeding this weekend which is worsening.  At first was "heavy spotting" but is now heavier.  Client states she worked hard to conceive this pregnancy and is very worried about a possible miscarriage.  Requests an ultrasound prior to a pelvic exam.  First pregnancy was a twin gestation.    OB History    Gravida Para Term Preterm AB TAB SAB Ectopic Multiple Living   2 1 1      1 2       Past Medical History  Diagnosis Date  . Celiac disease   . Thyroid disease   . Collagenous colitis   . Hypothyroidism   . Anemia     history  . Bartholin cyst 12/27/2011  . Infertility, female   . Headache(784.0)     complex migraines with cardiac and neuro symptoms during pregnancy  . Dichorionic diamniotic twin pregnancy 12/01/2012  . SVD (spontaneous vaginal delivery) 12/02/2012  . Delivery of twins, both live 12/02/2012    Past Surgical History  Procedure Laterality Date  . Tonsillectomy    . Rhinoplasty    . Upper gastrointestinal endoscopy    . Colonoscopy    . Bartholin cyst marsupialization  12/28/2011    Procedure: BARTHOLIN CYST MARSUPIALIZATION;  Surgeon: Sherron MondayJody Bovard, MD;  Location: [facility];  Service: Gynecology;  Laterality: N/A;    No family history on file.  History  Substance Use Topics  . Smoking status: Never Smoker   . Smokeless tobacco: Never Used  . Alcohol Use: Yes     Comment: socially when not pregnant    Allergies:  Allergies  Allergen Reactions  . Rifaximin Itching    Itching of throat  . Gluten Meal Diarrhea and Nausea And Vomiting    Celiac disease  . Levothyroxine Sodium Diarrhea    Prescriptions prior to admission  Medication Sig Dispense Refill Last Dose  . acetaminophen (TYLENOL) 500 MG  tablet Take 500 mg by mouth every 6 (six) hours as needed. For headaches and back pain.   01/10/2015 at Unknown time  . BETAMETHASONE DIPROPIONATE EX Apply 1 application topically 2 (two) times daily as needed. Apply to back of neck   Past Month at Unknown time  . Budesonide 9 MG TB24 Take 1 tablet by mouth daily.   01/11/2015 at Unknown time  . diphenoxylate-atropine (LOMOTIL) 2.5-0.025 MG per tablet Take 1 tablet by mouth 4 (four) times daily as needed for diarrhea or loose stools.   01/11/2015 at Unknown time  . levothyroxine (UNITHROID) 125 MCG tablet Take 125 mcg by mouth daily.     01/11/2015 at Unknown time  . ondansetron (ZOFRAN) 4 MG tablet Take 4 mg by mouth every 8 (eight) hours as needed. For nausea   Past Month at Unknown time  . Prenatal Vit-Fe Fumarate-FA (PRENATAL MULTIVITAMIN) TABS Take 1 tablet by mouth daily.   01/11/2015 at Unknown time  . cyclobenzaprine (FLEXERIL) 10 MG tablet Take 10 mg by mouth daily as needed. For muscle spasms   Past Month at Unknown  . ferrous sulfate 325 (65 FE) MG tablet Take 325 mg by mouth daily with breakfast.   Past Week at Unknown    ROS Physical  Exam   Blood pressure 109/83, temperature 98 F (36.7 C), temperature source Oral, resp. rate 18, height  (1.702 m), weight 107 lb 9.6 oz (48.807 kg), last menstrual period 11/25/2014, unknown if currently breastfeeding.  Physical Exam  Constitutional: She is oriented to person, place, and time. She appears well-developed and well-nourished. No distress.  Tearful, concerned about pregnancy  HENT:  Head: Normocephalic.  Neck: Normal range of motion. Neck supple.  Cardiovascular: Normal rate, regular rhythm and normal heart sounds.   Respiratory: Effort normal and breath sounds normal. No respiratory distress.  GI: Soft. She exhibits no mass. There is no tenderness. There is no rebound and no guarding.  Genitourinary: There is bleeding (small to moderate) in the vagina.  Musculoskeletal: Normal  range of motion.  Neurological: She is alert and oriented to person, place, and time.  Skin: Skin is warm and dry.    MAU Course  Procedures Labs and ultrasound ordered. Care assumed by Margarita Mail, CNM at 2000.    Results for orders placed or performed during the hospital encounter of 01/11/15 (from the past 24 hour(s))  CBC     Status: Abnormal   Collection Time: 01/11/15  7:50 PM  Result Value Ref Range   WBC 5.6 4.0 - 10.5 K/uL   RBC 4.08 3.87 - 5.11 MIL/uL   Hemoglobin 12.4 12.0 - 15.0 g/dL   HCT 16.1 (L) 09.6 - 04.5 %   MCV 85.8 78.0 - 100.0 fL   MCH 30.4 26.0 - 34.0 pg   MCHC 35.4 30.0 - 36.0 g/dL   RDW 40.9 81.1 - 91.4 %   Platelets 177 150 - 400 K/uL  hCG, quantitative, pregnancy     Status: Abnormal   Collection Time: 01/11/15  7:50 PM  Result Value Ref Range   hCG, Beta Chain, Quant, S 78295 (H) <5 mIU/mL  ABO/Rh     Status: None   Collection Time: 01/11/15  7:50 PM  Result Value Ref Range   ABO/RH(D) B POS    Ultrasound: FINDINGS: Intrauterine gestational sac: Visualized/normal in shape.  Yolk sac: Visualized  Embryo: Visualized  Cardiac Activity: Visualized  Heart Rate: 117 bpm  CRL: 3 mm 6 w 0 d [place] EDC: September 06, 2015  Maternal uterus/adnexae: There is no demonstrable subchorionic hemorrhage. Cervical os is closed. There is a corpus luteum in the left ovary measuring 2.2 x 2.0 cm, a physiologic finding. No other extrauterine pelvic or adnexal mass. No free pelvic fluid.  IMPRESSION: Single live intrauterine gestation with estimated gestational age of [redacted] weeks.  2135 Consulted with Dr. Senaida Ores > Reviewed HPI/Exam/labs> discharge to home Assessment and Plan  34 yo G2P1002 at [redacted]w[redacted]d wks IUP Vagina Bleeding in Early Pregnancy  Plan: Discharge to home Bleeding precautions Keep appointment scheduled for Tuesday   BURLESON,TERRI 01/11/2015, 7:48 PM

## 2015-01-11 NOTE — Discharge Instructions (Signed)

## 2015-01-18 ENCOUNTER — Inpatient Hospital Stay (HOSPITAL_COMMUNITY)
Admission: AD | Admit: 2015-01-18 | Discharge: 2015-01-18 | Disposition: A | Discharge status: Home / Self Care | Payer: 59 | Source: Ambulatory Visit | Attending: Obstetrics and Gynecology | Admitting: Obstetrics and Gynecology

## 2015-01-18 ENCOUNTER — Inpatient Hospital Stay (HOSPITAL_COMMUNITY): Payer: 59

## 2015-01-18 ENCOUNTER — Encounter (HOSPITAL_COMMUNITY): Payer: Self-pay

## 2015-01-18 DIAGNOSIS — O209 Hemorrhage in early pregnancy, unspecified: Secondary | ICD-10-CM | POA: Diagnosis present

## 2015-01-18 DIAGNOSIS — Z3A01 Less than 8 weeks gestation of pregnancy: Secondary | ICD-10-CM | POA: Insufficient documentation

## 2015-01-18 DIAGNOSIS — O039 Complete or unspecified spontaneous abortion without complication: Secondary | ICD-10-CM | POA: Diagnosis not present

## 2015-01-18 LAB — CBC
HCT: 34.5 % — ABNORMAL LOW (ref 36.0–46.0)
Hemoglobin: 12.4 g/dL (ref 12.0–15.0)
MCH: 30.4 pg (ref 26.0–34.0)
MCHC: 35.9 g/dL (ref 30.0–36.0)
MCV: 84.6 fL (ref 78.0–100.0)
Platelets: 195 10*3/uL (ref 150–400)
RBC: 4.08 MIL/uL (ref 3.87–5.11)
RDW: 11.8 % (ref 11.5–15.5)
WBC: 10.5 10*3/uL (ref 4.0–10.5)

## 2015-01-18 MED ORDER — METHYLERGONOVINE MALEATE 0.2 MG PO TABS: 0.2000 mg | ORAL_TABLET | Freq: Four times a day (QID) | ORAL | Status: AC

## 2015-01-18 MED ORDER — IBUPROFEN 600 MG PO TABS: 600.0000 mg | ORAL_TABLET | Freq: Four times a day (QID) | ORAL | Status: DC | PRN

## 2015-01-18 NOTE — Discharge Instructions (Signed)
Miscarriage A miscarriage is the sudden loss of an unborn baby (fetus) before the 20th week of pregnancy. Most miscarriages happen in the first 3 months of pregnancy. Sometimes, it happens before a woman even knows she is pregnant. A miscarriage is also called a "spontaneous miscarriage" or "early pregnancy loss." Having a miscarriage can be an emotional experience. Talk with your caregiver about any questions you may have about miscarrying, the grieving process, and your future pregnancy plans. CAUSES   Problems with the fetal chromosomes that make it impossible for the baby to develop normally. Problems with the baby's genes or chromosomes are most often the result of errors that occur, by chance, as the embryo divides and grows. The problems are not inherited from the parents.  Infection of the cervix or uterus.   Hormone problems.   Problems with the cervix, such as having an incompetent cervix. This is when the tissue in the cervix is not strong enough to hold the pregnancy.   Problems with the uterus, such as an abnormally shaped uterus, uterine fibroids, or congenital abnormalities.   Certain medical conditions.   Smoking, drinking alcohol, or taking illegal drugs.   Trauma.  Often, the cause of a miscarriage is unknown.  SYMPTOMS   Vaginal bleeding or spotting, with or without cramps or pain.  Pain or cramping in the abdomen or lower back.  Passing fluid, tissue, or blood clots from the vagina. DIAGNOSIS  Your caregiver will perform a physical exam. You may also have an ultrasound to confirm the miscarriage. Blood or urine tests may also be ordered. TREATMENT   Sometimes, treatment is not necessary if you naturally pass all the fetal tissue that was in the uterus. If some of the fetus or placenta remains in the body (incomplete miscarriage), tissue left behind may become infected and must be removed. Usually, a dilation and curettage (D and C) procedure is performed.  During a D and C procedure, the cervix is widened (dilated) and any remaining fetal or placental tissue is gently removed from the uterus.  Antibiotic medicines are prescribed if there is an infection. Other medicines may be given to reduce the size of the uterus (contract) if there is a lot of bleeding.  If you have Rh negative blood and your baby was Rh positive, you will need a Rh immunoglobulin shot. This shot will protect any future baby from having Rh blood problems in future pregnancies. HOME CARE INSTRUCTIONS   Your caregiver may order bed rest or may allow you to continue light activity. Resume activity as directed by your caregiver.  Have someone help with home and family responsibilities during this time.   Keep track of the number of sanitary pads you use each day and how soaked (saturated) they are. Write down this information.   Do not use tampons. Do not douche or have sexual intercourse until approved by your caregiver.   Only take over-the-counter or prescription medicines for pain or discomfort as directed by your caregiver.   Do not take aspirin. Aspirin can cause bleeding.   Keep all follow-up appointments with your caregiver.   If you or your partner have problems with grieving, talk to your caregiver or seek counseling to help cope with the pregnancy loss. Allow enough time to grieve before trying to get pregnant again.  SEEK IMMEDIATE MEDICAL CARE IF:   You have severe cramps or pain in your back or abdomen.  You have a fever.  You pass large blood clots (walnut-sized   or larger) ortissue from your vagina. Save any tissue for your caregiver to inspect.   Your bleeding increases.   You have a thick, bad-smelling vaginal discharge.  You become lightheaded, weak, or you faint.   You have chills.  MAKE SURE YOU:  Understand these instructions.  Will watch your condition.  Will get help right away if you are not doing well or get  worse. Document Released: 05/24/2001 Document Revised: 03/25/2013 Document Reviewed: 01/17/2012 Exit[employer] Patient Information 2015 [employer]. This information is not intended to replace advice given to you by your health care provider. Make sure you discuss any questions you have with your health care provider.  

## 2015-01-18 NOTE — MAU Note (Signed)
Pt presents with complaint of heavy bleeding. Pt states she was here one week ago with bleeding in early preg and was seen in office also and u/s were okay. States today the bleeding increased a lot.

## 2015-01-18 NOTE — MAU Provider Note (Signed)
Chief Complaint: Vaginal Bleeding   First Provider Initiated Contact with Patient 01/18/15 2108     SUBJECTIVE HPI: Emily [name] is a 34 y.o. G2P1002 at [redacted]w[redacted]d by LMP who presents with heavy bleeding immediately before coming to maternity admissions. Has been bleeding as heavy as a period for the last week. Ultrasound on January 31 in maternity admissions showed a 6 week live intrauterine pregnancy. Ultrasound in the office since then also showed fetal pole and cardiac activity. Bleeding tonight was much heavier, running down her legs. Denies cramping or passage of clots or tissue.   Past Medical History  Diagnosis Date  . Celiac disease   . Thyroid disease   . Collagenous colitis   . Hypothyroidism   . Anemia     history  . Bartholin cyst 12/27/2011  . Infertility, female   . Headache(784.0)     complex migraines with cardiac and neuro symptoms during pregnancy  . Dichorionic diamniotic twin pregnancy 12/01/2012  . SVD (spontaneous vaginal delivery) 12/02/2012  . Delivery of twins, both live 12/02/2012   OB History  Gravida Para Term Preterm AB SAB TAB Ectopic Multiple Living  2 1 1      1 2     # Outcome Date GA Lbr Len/2nd Weight Sex Delivery Anes PTL Lv  2 Current           1A Term 12/02/12 [redacted]w[redacted]d 05:29 / 00:50 2.804 kg (6 lb 2.9 oz) F Vag-Spont EPI  Y  1B Term 12/02/12 [redacted]w[redacted]d 05:29 / 00:56 2.036 kg (4 lb 7.8 oz) F Vag-Spont EPI  Y     Past Surgical History  Procedure Laterality Date  . Tonsillectomy    . Rhinoplasty    . Upper gastrointestinal endoscopy    . Colonoscopy    . Bartholin cyst marsupialization  12/28/2011    Procedure: BARTHOLIN CYST MARSUPIALIZATION;  Surgeon: Sherron Monday, MD;  Location: [facility];  Service: Gynecology;  Laterality: N/A;   History   Social History  . Marital Status: Married    Spouse Name: N/A    Number of Children: N/A  . Years of Education: N/A   Occupational History  . Not on file.   Social History Main Topics  . Smoking status:  Never Smoker   . Smokeless tobacco: Never Used  . Alcohol Use: No     Comment: socially when not pregnant  . Drug Use: No  . Sexual Activity: Not Currently    Birth Control/ Protection: None   Other Topics Concern  . Not on file   Social History Narrative   No current facility-administered medications on file prior to encounter.   Current Outpatient Prescriptions on File Prior to Encounter  Medication Sig Dispense Refill  . acetaminophen (TYLENOL) 325 MG tablet Take 650 mg by mouth every 6 (six) hours as needed for headache.    Marland Kitchen BETAMETHASONE DIPROPIONATE EX Apply 1 application topically 2 (two) times daily as needed (for itching).     . Budesonide 9 MG TB24 Take 1 tablet by mouth daily.    . diphenoxylate-atropine (LOMOTIL) 2.5-0.025 MG per tablet Take 1 tablet by mouth 4 (four) times daily as needed for diarrhea or loose stools.    Marland Kitchen levothyroxine (UNITHROID) 125 MCG tablet Take 125 mcg by mouth daily.      . Prenatal Vit-Fe Fumarate-FA (PRENATAL MULTIVITAMIN) TABS Take 1 tablet by mouth daily.     Allergies  Allergen Reactions  . Rifaximin Itching and Other (See Comments)    Reaction:  Itching of throat  . Gluten Meal Diarrhea, Nausea And Vomiting and Other (See Comments)    Pt has celiac disease  . Levothyroxine Sodium Diarrhea    Only has GI reaction to synthroid    ROS: Pertinent items in HPI. Negative for fever, chills, abdominal pain, urinary complaints, GI complaints, dizziness, weakness, passage of clots or tissue.  OBJECTIVE Blood pressure 115/73, pulse 90, temperature 99 F (37.2 C), temperature source Oral, resp. rate 16, last menstrual period 11/25/2014, not currently breastfeeding. GENERAL: Well-developed, well-nourished female in mild emotional distress.  HEENT: Normocephalic HEART: normal rate RESP: normal effort ABDOMEN: Soft, non-tender. Positive bowel sounds 4. EXTREMITIES: Nontender, no edema NEURO: Alert and oriented SPECULUM EXAM: NEFG, large  amount of clots and one 2 cm tan tissue fragment in vagina. After vagina cleaned out with Fox swabs there is small amount of active bleeding from the os. cervix clean, 1 cm ectropion. Visually closed. BIMANUAL: cervix closed; uterus slightly enlarged, no adnexal tenderness or masses. Cervical motion tenderness.  LAB RESULTS Results for orders placed or performed during the hospital encounter of 01/18/15 (from the past 24 hour(s))  CBC     Status: Abnormal   Collection Time: 01/18/15  9:15 PM  Result Value Ref Range   WBC 10.5 4.0 - 10.5 K/uL   RBC 4.08 3.87 - 5.11 MIL/uL   Hemoglobin 12.4 12.0 - 15.0 g/dL   HCT 16.1 (L) 09.6 - 04.5 %   MCV 84.6 78.0 - 100.0 fL   MCH 30.4 26.0 - 34.0 pg   MCHC 35.9 30.0 - 36.0 g/dL   RDW 40.9 81.1 - 91.4 %   Platelets 195 150 - 400 K/uL  Type and screen     Status: None (Preliminary result)   Collection Time: 01/18/15  9:15 PM  Result Value Ref Range   ABO/RH(D) B POS    Antibody Screen PENDING    Sample Expiration 01/21/2015     IMAGING US Ob Comp Less 14 Wks  01/11/2015   CLINICAL DATA:  Vaginal spotting  EXAM: OBSTETRIC <14 WK [place] AND TRANSVAGINAL OB US  TECHNIQUE: Both transabdominal and transvaginal ultrasound examinations were performed for complete evaluation of the gestation as well as the maternal uterus, adnexal regions, and pelvic cul-de-sac. Transvaginal technique was performed to assess early pregnancy.  COMPARISON:  None.  FINDINGS: Intrauterine gestational sac: Visualized/normal in shape.  Yolk sac:  Visualized  Embryo:  Visualized  Cardiac Activity: Visualized  Heart Rate:  117 bpm  CRL:   3  mm   6 w 0 d                  [place] EDC: September 06, 2015  Maternal uterus/adnexae: There is no demonstrable subchorionic hemorrhage. Cervical os is closed. There is a corpus luteum in the left ovary measuring 2.2 x 2.0 cm, a physiologic finding. No other extrauterine pelvic or adnexal mass. No free pelvic fluid.  IMPRESSION: Single live intrauterine  gestation with estimated gestational age of [redacted] weeks.   Electronically Signed   By: Bretta Bang M.D.   On: 01/11/2015 21:17   US Ob Transvaginal  01/18/2015   CLINICAL DATA:  Bleeding since 01/10/2015, increasing today. Estimated gestational age by LMP is 7 weeks 5 days. Quantitative beta HCG was not ordered today but was 12,665 on 01/11/2015.  EXAM: TRANSVAGINAL OB ULTRASOUND  TECHNIQUE: Transvaginal ultrasound was performed for complete evaluation of the gestation as well as the maternal uterus, adnexal regions, and pelvic cul-de-sac.  COMPARISON:  01/11/2015  FINDINGS: Intrauterine gestational sac: No intrauterine gestational sac demonstrated. A single living intrauterine gestational sac was present on 01/11/2015 comparison study.  Yolk sac:  Yolk sac is not identified.  Embryo:  The fetal pole is not identified.  Cardiac Activity: Not identified.  Maternal uterus/adnexae: Heterogeneous appearance of the endometrium with next hyperechoic material. Endometrial stripe thickness measures 19.7 mm. Color flow Doppler imaging was not obtained. Both ovaries are visualized and appear normal. Corpus luteal cyst shown on the right. No free pelvic fluid collections.  IMPRESSION: No intrauterine gestational sac, fetal pole, or yolk sac identified today. These were present on previous study. Appearance is consistent with interval loss of pregnancy.   Electronically Signed   By: Burman Nieves M.D.   On: 01/18/2015 22:17   US Ob Transvaginal  01/11/2015   CLINICAL DATA:  Vaginal spotting  EXAM: OBSTETRIC <14 WK [place] AND TRANSVAGINAL OB US  TECHNIQUE: Both transabdominal and transvaginal ultrasound examinations were performed for complete evaluation of the gestation as well as the maternal uterus, adnexal regions, and pelvic cul-de-sac. Transvaginal technique was performed to assess early pregnancy.  COMPARISON:  None.  FINDINGS: Intrauterine gestational sac: Visualized/normal in shape.  Yolk sac:  Visualized  Embryo:   Visualized  Cardiac Activity: Visualized  Heart Rate:  117 bpm  CRL:   3  mm   6 w 0 d                  [place] EDC: September 06, 2015  Maternal uterus/adnexae: There is no demonstrable subchorionic hemorrhage. Cervical os is closed. There is a corpus luteum in the left ovary measuring 2.2 x 2.0 cm, a physiologic finding. No other extrauterine pelvic or adnexal mass. No free pelvic fluid.  IMPRESSION: Single live intrauterine gestation with estimated gestational age of [redacted] weeks.   Electronically Signed   By: Bretta Bang M.D.   On: 01/11/2015 21:17    MAU COURSE Moderate amount of bleeding when up to the bathroom after ultrasound, but much lighter than it had been over the past 2 hours. Denies dizziness or weakness with ambulation. Offered Methergine dose in maternity admissions and 24 hours. Patient declines Methergine in maternity admissions but will take prescription home to use in case bleeding gets heavier.  ASSESSMENT 1. Miscarriage   2. Vaginal bleeding in pregnancy, first trimester     PLAN Discharge home in stable condition per consult with Dr. Betsy Pries. Support given. Offered chaplain, declined. Bleeding precautions. Tissue sent to pathology. Follow-up Information    Follow up with Oliver Pila, MD.   Specialty:  Obstetrics and Gynecology   Why:  Monday morning to schedule an appointment in 1-2 weeks   Contact information:   [address] [place] [address] [phone]       Follow up with [facility].   Why:  As needed in emergencies (fever greater than 100.4, severe pain or heavy bleeding)   Contact information:   [address] [id number] [address] [place] [address] [phone]       Medication List    TAKE these medications        acetaminophen 325 MG tablet  Commonly known as:  TYLENOL  Take 650 mg by mouth every 6 (six) hours as needed for headache.     BETAMETHASONE  DIPROPIONATE EX  Apply 1 application topically 2 (two) times daily as needed (for itching).     Budesonide 9 MG Tb24  Take 1 tablet by mouth daily.     diphenoxylate-atropine 2.5-0.025 MG per tablet  Commonly known as:  LOMOTIL  Take 1 tablet by mouth 4 (four) times daily as needed for diarrhea or loose stools.     ibuprofen 600 MG tablet  Commonly known as:  ADVIL,MOTRIN  Take 1 tablet (600 mg total) by mouth every 6 (six) hours as needed for cramping.     methylergonovine 0.2 MG tablet  Commonly known as:  METHERGINE  Take 1 tablet (0.2 mg total) by mouth 4 (four) times daily.     prenatal multivitamin Tabs tablet  Take 1 tablet by mouth daily.     UNITHROID 125 MCG tablet  Generic drug:  levothyroxine  Take 125 mcg by mouth daily.       [place]ell[place]ia Micholas Drumwright, [place] 01/18/2015  11:31 PM

## 2015-01-19 LAB — TYPE AND SCREEN
ABO/RH(D): B POS
ANTIBODY SCREEN: POSITIVE
DAT, IGG: NEGATIVE
PT AG Type: NEGATIVE

## 2015-06-30 ENCOUNTER — Encounter: Payer: Self-pay | Admitting: *Deleted

## 2015-08-06 ENCOUNTER — Ambulatory Visit: Payer: 59 | Admitting: Internal Medicine

## 2015-08-07 ENCOUNTER — Encounter: Payer: Self-pay | Admitting: Cardiovascular Disease

## 2015-08-07 ENCOUNTER — Encounter: Payer: Self-pay | Admitting: Internal Medicine

## 2015-12-13 NOTE — L&D Delivery Note (Signed)
Delivery Note Pt progressed quickly to complete dilation and pushed great about 10 min.  At 4:46 AM a healthy female was delivered via Vaginal, Spontaneous Delivery (Presentation: OA ).  APGAR: 9, 9; weight  pending.   Placenta status: Intact, Spontaneous.  Cord: 3 vessels with the following complications: None.    Anesthesia: Epidural  Episiotomy: None Lacerations: 2nd degree Suture Repair: 3.0 vicryl rapide Est. Blood Loss (mL):  175cc  Mom to postpartum.  Baby to Couplet care / Skin to Skin. D/w pt and husband circumcision in detail and they desire to proceed.  Oliver Pila 01/08/2016, 5:13 AM

## 2015-12-20 ENCOUNTER — Encounter (HOSPITAL_COMMUNITY): Payer: Self-pay

## 2015-12-20 ENCOUNTER — Encounter (HOSPITAL_COMMUNITY): Payer: Self-pay | Admitting: Anesthesiology

## 2015-12-20 ENCOUNTER — Inpatient Hospital Stay (HOSPITAL_COMMUNITY)
Admission: AD | Admit: 2015-12-20 | Discharge: 2015-12-21 | DRG: 780 | Disposition: A | Discharge status: Home / Self Care | Payer: 59 | Source: Ambulatory Visit | Attending: Obstetrics and Gynecology | Admitting: Obstetrics and Gynecology

## 2015-12-20 DIAGNOSIS — O99283 Endocrine, nutritional and metabolic diseases complicating pregnancy, third trimester: Secondary | ICD-10-CM | POA: Diagnosis present

## 2015-12-20 DIAGNOSIS — Z3A36 36 weeks gestation of pregnancy: Secondary | ICD-10-CM | POA: Diagnosis not present

## 2015-12-20 DIAGNOSIS — O4702 False labor before 37 completed weeks of gestation, second trimester: Secondary | ICD-10-CM | POA: Diagnosis present

## 2015-12-20 DIAGNOSIS — E039 Hypothyroidism, unspecified: Secondary | ICD-10-CM | POA: Diagnosis present

## 2015-12-20 DIAGNOSIS — O99613 Diseases of the digestive system complicating pregnancy, third trimester: Secondary | ICD-10-CM | POA: Diagnosis present

## 2015-12-20 DIAGNOSIS — K900 Celiac disease: Secondary | ICD-10-CM | POA: Diagnosis present

## 2015-12-20 LAB — URINALYSIS, ROUTINE W REFLEX MICROSCOPIC
BILIRUBIN URINE: NEGATIVE
GLUCOSE, UA: NEGATIVE mg/dL
Hgb urine dipstick: NEGATIVE
KETONES UR: NEGATIVE mg/dL
Leukocytes, UA: NEGATIVE
Nitrite: NEGATIVE
PH: 6 (ref 5.0–8.0)
Protein, ur: NEGATIVE mg/dL
SPECIFIC GRAVITY, URINE: 1.015 (ref 1.005–1.030)

## 2015-12-20 LAB — CBC
HCT: 33 % — ABNORMAL LOW (ref 36.0–46.0)
Hemoglobin: 11.2 g/dL — ABNORMAL LOW (ref 12.0–15.0)
MCH: 27.5 pg (ref 26.0–34.0)
MCHC: 33.9 g/dL (ref 30.0–36.0)
MCV: 80.9 fL (ref 78.0–100.0)
Platelets: 156 10*3/uL (ref 150–400)
RBC: 4.08 MIL/uL (ref 3.87–5.11)
RDW: 13.5 % (ref 11.5–15.5)
WBC: 15.9 10*3/uL — ABNORMAL HIGH (ref 4.0–10.5)

## 2015-12-20 LAB — OB RESULTS CONSOLE RUBELLA ANTIBODY, IGM: Rubella: IMMUNE

## 2015-12-20 LAB — OB RESULTS CONSOLE GC/CHLAMYDIA
CHLAMYDIA, DNA PROBE: NEGATIVE
GC PROBE AMP, GENITAL: NEGATIVE

## 2015-12-20 LAB — OB RESULTS CONSOLE RPR: RPR: NONREACTIVE

## 2015-12-20 LAB — OB RESULTS CONSOLE HIV ANTIBODY (ROUTINE TESTING): HIV: NONREACTIVE

## 2015-12-20 LAB — OB RESULTS CONSOLE HEPATITIS B SURFACE ANTIGEN: Hepatitis B Surface Ag: NEGATIVE

## 2015-12-20 MED ORDER — OXYCODONE-ACETAMINOPHEN 5-325 MG PO TABS: 2.0000 | ORAL_TABLET | ORAL | Status: DC | PRN

## 2015-12-20 MED ORDER — OXYTOCIN 10 UNIT/ML IJ SOLN: 2.5000 [IU]/h | INTRAMUSCULAR | Status: DC

## 2015-12-20 MED ORDER — LACTATED RINGERS IV SOLN
INTRAVENOUS | Status: DC
  Administered 2015-12-20: 22:00:00 via INTRAVENOUS

## 2015-12-20 MED ORDER — DIPHENHYDRAMINE HCL 50 MG/ML IJ SOLN: 12.5000 mg | INTRAMUSCULAR | Status: DC | PRN

## 2015-12-20 MED ORDER — LACTATED RINGERS IV SOLN: 500.0000 mL | INTRAVENOUS | Status: DC | PRN

## 2015-12-20 MED ORDER — BETAMETHASONE SOD PHOS & ACET 6 (3-3) MG/ML IJ SUSP
12.0000 mg | Freq: Every day | INTRAMUSCULAR | Status: DC
  Administered 2015-12-20: 12 mg via INTRAMUSCULAR
  Filled 2015-12-20: qty 2

## 2015-12-20 MED ORDER — DEXTROSE 5 % IV SOLN
5.0000 10*6.[IU] | Freq: Once | INTRAVENOUS | Status: AC
  Administered 2015-12-20: 5 10*6.[IU] via INTRAVENOUS
  Filled 2015-12-20: qty 5

## 2015-12-20 MED ORDER — FENTANYL 2.5 MCG/ML BUPIVACAINE 1/10 % EPIDURAL INFUSION (WH - ANES)
14.0000 mL/h | INTRAMUSCULAR | Status: DC | PRN
  Filled 2015-12-20: qty 125

## 2015-12-20 MED ORDER — PHENYLEPHRINE 40 MCG/ML (10ML) SYRINGE FOR IV PUSH (FOR BLOOD PRESSURE SUPPORT)
80.0000 ug | PREFILLED_SYRINGE | INTRAVENOUS | Status: DC | PRN
  Filled 2015-12-20: qty 20

## 2015-12-20 MED ORDER — PENICILLIN G POTASSIUM 5000000 UNITS IJ SOLR
2.5000 10*6.[IU] | INTRAVENOUS | Status: DC
  Administered 2015-12-21: 2.5 10*6.[IU] via INTRAVENOUS
  Filled 2015-12-20 (×4): qty 2.5

## 2015-12-20 MED ORDER — EPHEDRINE 5 MG/ML INJ: 10.0000 mg | INTRAVENOUS | Status: DC | PRN

## 2015-12-20 MED ORDER — BUTORPHANOL TARTRATE 1 MG/ML IJ SOLN: 1.0000 mg | INTRAMUSCULAR | Status: DC | PRN

## 2015-12-20 MED ORDER — CITRIC ACID-SODIUM CITRATE 334-500 MG/5ML PO SOLN: 30.0000 mL | ORAL | Status: DC | PRN

## 2015-12-20 MED ORDER — ONDANSETRON HCL 4 MG/2ML IJ SOLN
4.0000 mg | Freq: Four times a day (QID) | INTRAMUSCULAR | Status: DC | PRN
  Administered 2015-12-20: 4 mg via INTRAVENOUS
  Filled 2015-12-20: qty 2

## 2015-12-20 MED ORDER — OXYCODONE-ACETAMINOPHEN 5-325 MG PO TABS: 1.0000 | ORAL_TABLET | ORAL | Status: DC | PRN

## 2015-12-20 MED ORDER — LIDOCAINE HCL (PF) 1 % IJ SOLN: 30.0000 mL | INTRAMUSCULAR | Status: DC | PRN

## 2015-12-20 MED ORDER — OXYTOCIN BOLUS FROM INFUSION: 500.0000 mL | INTRAVENOUS | Status: DC

## 2015-12-20 NOTE — MAU Note (Signed)
Patient presents with contractions every 3 minutes, denies vaginal bleeding, increase in cervical fluid.

## 2015-12-20 NOTE — Anesthesia Preprocedure Evaluation (Deleted)
Anesthesia Evaluation  Patient identified by MRN, date of birth, ID band Patient awake    Reviewed: Allergy & Precautions, H&P , Patient's Chart, lab work & pertinent test results  Airway Mallampati: II  TM Distance: >3 FB Neck ROM: full    Dental  (+) Teeth Intact   Pulmonary    breath sounds clear to auscultation       Cardiovascular  Rhythm:regular Rate:Normal     Neuro/Psych  Headaches,    GI/Hepatic Celiac disease   Endo/Other  Hypothyroidism   Renal/GU      Musculoskeletal   Abdominal   Peds  Hematology  (+) anemia ,   Anesthesia Other Findings Pregnancy - uncomplicated Platelets and allergies reviewed Denies active cardiac or pulmonary symptoms, METS > 4  Denies blood thinning medications, bleeding disorders, hypertension, asthma, supine hypotension syndrome, previous anesthesia difficulties     Reproductive/Obstetrics (+) Pregnancy                             Anesthesia Physical  Anesthesia Plan  ASA: II  Anesthesia Plan: Epidural   Post-op Pain Management:    Induction:   Airway Management Planned:   Additional Equipment:   Intra-op Plan:   Post-operative Plan:   Informed Consent: I have reviewed the patients History and Physical, chart, labs and discussed the procedure including the risks, benefits and alternatives for the proposed anesthesia with the patient or authorized representative who has indicated his/her understanding and acceptance.     Plan Discussed with:   Anesthesia Plan Comments:        Anesthesia Quick Evaluation

## 2015-12-21 LAB — AMNISURE RUPTURE OF MEMBRANE (ROM) NOT AT ARMC: AMNISURE: NEGATIVE

## 2015-12-21 LAB — RPR: RPR: NONREACTIVE

## 2015-12-21 NOTE — Progress Notes (Signed)
Amnisure is negative, will d/c home.  Discussed that she could go into labor any time, but she is not in labor and no ROM right now.

## 2015-12-21 NOTE — Progress Notes (Signed)
Initial visit with patient upon referral from nurse in safety rounds.  Ms Emily [name] is very emotional after learning that she is not going to be delivering her son today.  She shared that she is overwhelmed because she has had to navigate a lot of things in order to make a plan to care for her 3 year olds.  Her parents are older and live in [place] and had to stay on the couch at her house without sleep, and she hasn't had sleep and had very painful contractions for 7 hours, and her husband is in [employer] and works 30 minutes from their home.  She shared that she has about 2 hours of coordinating  To work out just to get to the hospital.  I validated her feelings recognizing that one of the most significant moments of your life is something you have absolutely no control over and normalized the desire to try to find some control in all of that.  I validated her feelings of sadness and disappointment in having to start all over again-she emphasized that she may even have to do it again in the next 24 hours.  I encouraged her to consider that spending time with their grandchildren may have been a blessing to her parents and the added time with her girls may hold opportunities yet unseen.  I encouraged her to think about who she has for support and who might be able to help her figure all of this out, at which point she said, "now I feel like you're trying to give me therapy and since I'm the only one who can say this is labor, it's all on me."  Pt's husband stated he did not want her to feel like she had to manage all of this on her own and she replied "now I really feel like you're trying to give me therapy."  I assured her that we just wanted to provide support in an overwhelming situation and that sometimes an outside perspective can be helpful.  Pt accepted that she has to be discharged home and is trying to find some encouragement in the idea that she has a doctor's appointment later today.    Please  page as further needs arise.  Emily ShapeAmanda M. Carley [name], M.Div. [facility] Chaplain Pager [phone] Office [phone]      12/21/15 [phone]  Clinical Encounter Type  Visited With Patient and family together  Visit Type Initial;Social support  Referral From Nurse  Consult/Referral To Chaplain  Spiritual Encounters  Spiritual Needs Emotional  Stress Factors  Patient Stress Factors Loss of control;Major life changes

## 2015-12-21 NOTE — H&P (Signed)
Emily [name] is a 35 y.o. female, G3 P1012, EGA 36+ weeks with EDC 2-3 presenting for ctx.  She presented to MAU last pm for ctx.  Last VE in the office 2 cm dilated, she was 3 cm in MAU per RN with ctx q 2 min.  She was admitted for expectant management, given betamethasone for pulmonary maturation, PCN started for unknown GBS.  Overnight her ctx have been irregular, occasionally painful, but her cervix, per RN, has not changed since admission.  PNC complicated by anti-S and anti-E antibodies.  Maternal Medical History:  Reason for admission: Contractions.   Contractions: Frequency: regular.   Perceived severity is moderate.    Fetal activity: Perceived fetal activity is normal.      OB History    Gravida Para Term Preterm AB TAB SAB Ectopic Multiple Living   3 1 1      1 2     SVD twins  Past Medical History  Diagnosis Date  . Celiac disease   . Thyroid disease   . Collagenous colitis   . Hypothyroidism   . Anemia     history  . Bartholin cyst 12/27/2011  . Infertility, female   . Headache(784.0)     complex migraines with cardiac and neuro symptoms during pregnancy  . Dichorionic diamniotic twin pregnancy 12/01/2012  . SVD (spontaneous vaginal delivery) 12/02/2012  . Delivery of twins, both live 12/02/2012   Past Surgical History  Procedure Laterality Date  . Tonsillectomy    . Rhinoplasty    . Upper gastrointestinal endoscopy    . Colonoscopy    . Bartholin cyst marsupialization  12/28/2011    Procedure: BARTHOLIN CYST MARSUPIALIZATION;  Surgeon: Emily Monday, MD;  Location: [facility];  Service: Gynecology;  Laterality: N/A;  . Wisdom tooth extraction     Family History: family history includes Clotting disorder in her paternal uncle; Heart attack in her maternal grandmother; Rashes / Skin problems in her paternal grandmother. Social History:  reports that she has never smoked. She has never used smokeless tobacco. She reports that she does not drink alcohol or use  illicit drugs.   Prenatal Transfer Tool  Maternal Diabetes: No Genetic Screening: Normal Maternal Ultrasounds/Referrals: Normal Fetal Ultrasounds or other Referrals:  None Maternal Substance Abuse:  No Significant Maternal Medications:  None Significant Maternal Lab Results:  None Other Comments:  GBS unknown, anti-S and anti-E antibodies  Review of Systems  Respiratory: Negative.   Cardiovascular: Negative.     Dilation: 3 Effacement (%): 70 Station: -2 Exam by:: Emily Newcomer, RN Blood pressure 129/66, pulse 100, temperature 97.9 F (36.6 C), temperature source Oral, resp. rate 16, height 5\' 6"  (1.676 m), weight 62.778 kg (138 lb 6.4 oz), unknown if currently breastfeeding. Maternal Exam:  Uterine Assessment: Contraction strength is mild.  Contraction frequency is irregular.   Abdomen: Patient reports no abdominal tenderness. Estimated fetal weight is 7 lbs.   Fetal presentation: vertex  Introitus: Normal vulva. Normal vagina.  Pelvis: adequate for delivery.      Fetal Exam Fetal Monitor Review: Mode: ultrasound.   Baseline rate: 110-120.  Variability: moderate (6-25 bpm).   Pattern: accelerations present and no decelerations.    Fetal State Assessment: Category I - tracings are normal.     Physical Exam  Vitals reviewed. Constitutional: She appears well-developed and well-nourished.  Cardiovascular: Normal rate, regular rhythm and normal heart sounds.   No murmur heard. Respiratory: Effort normal and breath sounds normal. No respiratory distress. She has no wheezes.  GI: Soft.    Prenatal labs: ABO, Rh: --/--/B POS (01/08 2145) Antibody: POS (01/08 2145) Rubella: Immune (01/08 2223) RPR: Nonreactive (01/08 2222)  HBsAg: Negative (01/08 2223)  HIV: Non-reactive (01/08 2222)  GBS:   unknown  Assessment/Plan: IUP at 36+ weeks with false labor.  No cervical change with irregular ctx since last pm, 10 hours.  She now thinks she may be leaking fluid, so  will send Amnisure.  If Amnisure is positive will augment labor.  If Amnisure is negative I think she has proven she is not in labor and will be discharged home.  I have recommended she come to MAU tonight at 2200 if she goes home for her second betamethasone injection.   Emily [name] D 12/21/2015, 8:25 AM

## 2015-12-23 NOTE — Discharge Summary (Signed)
Physician Discharge Summary  Patient ID: Emily [name] MRN: [id number] DOB/AGE: November 20, 1981 35 y.o.  Admit date: 12/20/2015 Discharge date: 12/21/2015  Admission Diagnoses:  IUP at 36 weeks, preterm labor  Discharge Diagnoses: IUP at 36 weeks, preterm contractions with false labor Active Problems:   Preterm labor in third trimester without delivery   Discharged Condition: good  Hospital Course: Pt admitted since per RN cervix had changed from 2 cm in the office to 3 cm in MAU.  She received a shot of betamethasone for fetal pulmonary maturation, was put on PCN for unknown GBS, and observed.  Overnight, ctx quiesced, cervix did not change.  She then thought she was possible leaking fluid, Amnisure was negative.  Since she was not in labor she was discharged home.   Discharge Exam: Blood pressure 129/66, pulse 100, temperature 97.9 F (36.6 C), temperature source Oral, resp. rate 16, height 5\' 6"  (1.676 m), weight 62.778 kg (138 lb 6.4 oz), unknown if currently breastfeeding. General appearance: alert  Disposition: 01-Home or Self Care  Discharge Instructions    Discharge activity:  No Restrictions    Complete by:  As directed      Discharge diet:  No restrictions    Complete by:  As directed      LABOR:  When conractions begin, you should start to time them from the beginning of one contraction to the beginning  of the next.  When contractions are 5 - 10 minutes apart or less and have been regular for at least an hour, you should call your health care provider.    Complete by:  As directed      No sexual activity restrictions    Complete by:  As directed      Notify physician for bleeding from the vagina    Complete by:  As directed      Notify physician for blurring of vision or spots before the eyes    Complete by:  As directed      Notify physician for chills or fever    Complete by:  As directed      Notify physician for fainting spells, "black outs" or loss of consciousness     Complete by:  As directed      Notify physician for increase in vaginal discharge    Complete by:  As directed      Notify physician for leaking of fluid    Complete by:  As directed      Notify physician for pain or burning when urinating    Complete by:  As directed      Notify physician for pelvic pressure (sudden increase)    Complete by:  As directed      Notify physician for severe or continued nausea or vomiting    Complete by:  As directed      Notify physician for sudden gushing of fluid from the vagina (with or without continued leaking)    Complete by:  As directed      Notify physician for sudden, constant, or occasional abdominal pain    Complete by:  As directed      Notify physician if baby moving less than usual    Complete by:  As directed             Medication List    TAKE these medications        acetaminophen 325 MG tablet  Commonly known as:  TYLENOL  Take 650 mg by mouth every  6 (six) hours as needed for headache.     Budesonide 9 MG Tb24  Take 1 tablet by mouth daily.     prenatal multivitamin Tabs tablet  Take 1 tablet by mouth daily.     UNITHROID 125 MCG tablet  Generic drug:  levothyroxine  Take 125 mcg by mouth daily.           Follow-up Information    Follow up with Oliver PilaICHARDSON,KATHY W, MD.   Specialty:  Obstetrics and Gynecology   Why:  as scheduled   Contact information:   [address] [place] [id number] [phone]       Signed: Zenaida NieceMEISINGER,Serrita Lueth D 12/23/2015, 4:45 PM

## 2015-12-24 LAB — TYPE AND SCREEN
ABO/RH(D): B POS
ANTIBODY SCREEN: POSITIVE
DAT, IgG: NEGATIVE
DONOR AG TYPE: NEGATIVE
Donor AG Type: NEGATIVE
Unit division: 0
Unit division: 0

## 2016-01-01 ENCOUNTER — Telehealth (HOSPITAL_COMMUNITY): Payer: Self-pay | Admitting: *Deleted

## 2016-01-01 LAB — OB RESULTS CONSOLE GBS: STREP GROUP B AG: NEGATIVE

## 2016-01-01 NOTE — Telephone Encounter (Signed)
Preadmission screen  

## 2016-01-06 ENCOUNTER — Telehealth (HOSPITAL_COMMUNITY): Payer: Self-pay | Admitting: *Deleted

## 2016-01-06 ENCOUNTER — Encounter (HOSPITAL_COMMUNITY): Payer: Self-pay | Admitting: *Deleted

## 2016-01-06 NOTE — H&P (Signed)
Emily [name] is a 35 y.o. female G3P1002 at 92 weeks(EDD 01/15/16 by LMP c/w 6 week [place]) presenting for IOL at term.  She has been positively miserable for last several weeks with frequent migraines that are atypical with some neuro sx (numbness, tingling, aphasia)  These are not uncommon for her and has had extensive w/u in past but have become more frequent with this pregnancy. Prenatal care other wise complicated by +antibody screen--  anti-E and anti-S they have remained low at less than 16 on q 2 week checks.  Serial [place] for growth WNL also, last one on 01/05/16 EFW: 19.3%, AFI: 16.08cm. AC: [redacted]W[redacted]d... < 2.3%, BPP 8/8.  She had a low lying placeneta that resolved.  Also has hypothyroidism, chronis colitis, and SVT which have been stable.   Maternal Medical History:  Contractions: Frequency: irregular.   Perceived severity is mild.    Fetal activity: Perceived fetal activity is normal.    Prenatal Complications - Diabetes: none.    OB History    Gravida Para Term Preterm AB TAB SAB Ectopic Multiple Living   12/13 NSVD x 2-- twins with transfusion   SAB  Past Medical History  Diagnosis Date  . Celiac disease   . Thyroid disease   . Collagenous colitis   . Hypothyroidism   . Anemia     history  . Bartholin cyst 12/27/2011  . Infertility, female   . Headache(784.0)     complex migraines with cardiac and neuro symptoms during pregnancy  . Dichorionic diamniotic twin pregnancy 12/01/2012  . SVD (spontaneous vaginal delivery) 12/02/2012  . Delivery of twins, both live 12/02/2012   Past Surgical History  Procedure Laterality Date  . Tonsillectomy    . Rhinoplasty    . Upper gastrointestinal endoscopy    . Colonoscopy    . Bartholin cyst marsupialization  12/28/2011    Procedure: BARTHOLIN CYST MARSUPIALIZATION;  Surgeon: Sherron Monday, MD;  Location: [facility];  Service: Gynecology;  Laterality: N/A;  . Wisdom tooth extraction     Family History: family history  includes Clotting disorder in her paternal uncle; Heart attack in her maternal grandmother; Rashes / Skin problems in her paternal grandmother. Social History:  reports that she has never smoked. She has never used smokeless tobacco. She reports that she does not drink alcohol or use illicit drugs.   Prenatal Transfer Tool  Maternal Diabetes: No Genetic Screening: Normal Maternal Ultrasounds/Referrals: Normal Fetal Ultrasounds or other Referrals:  None Maternal Substance Abuse:  No Significant Maternal Medications:  Meds include: Syntroid Other: Uceris Significant Maternal Lab Results:  Lab values include: Other: +AB screen Other Comments:  None  Review of Systems  Gastrointestinal: Negative for abdominal pain.  Neurological: Positive for headaches.      unknown if currently breastfeeding. Maternal Exam:  Uterine Assessment: Contraction strength is mild.  Contraction frequency is irregular.   Abdomen: Patient reports no abdominal tenderness. Fetal presentation: vertex  Introitus: Normal vulva. Normal vagina.    Physical Exam  Constitutional: She appears well-developed and well-nourished.  Cardiovascular: Normal rate and regular rhythm.   Respiratory: Effort normal.  GI: Soft.  Genitourinary: Vagina normal.  Neurological: She is alert.  Psychiatric: She has a normal mood and affect.    Prenatal labs: ABO, Rh: --/--/B POS (01/08 2145) Antibody: POS (01/08 2145)  titres followed Rubella: Immune (01/08 2223) RPR: Nonreactive (01/08 2222)  HBsAg: Negative (01/08 2223)  HIV: Non-reactive (  01/08 2222)  GBS: Negative (01/20 0000)  One hour GCT WNL First trimester screen negative   Assessment/Plan: Pt for IOL at term.  Given + Ab screen will cross match pt on admission.  Plan AROM and Pitocin   Stesha Neyens W 01/06/2016, 4:48 PM

## 2016-01-06 NOTE — Telephone Encounter (Signed)
Preadmission screen  

## 2016-01-07 ENCOUNTER — Inpatient Hospital Stay (HOSPITAL_COMMUNITY): Admission: RE | Admit: 2016-01-07 | Payer: 59 | Source: Ambulatory Visit

## 2016-01-08 ENCOUNTER — Encounter (HOSPITAL_COMMUNITY): Payer: Self-pay

## 2016-01-08 ENCOUNTER — Inpatient Hospital Stay (HOSPITAL_COMMUNITY): Payer: 59 | Admitting: Anesthesiology

## 2016-01-08 ENCOUNTER — Inpatient Hospital Stay (HOSPITAL_COMMUNITY)
Admission: RE | Admit: 2016-01-08 | Discharge: 2016-01-09 | DRG: 775 | Disposition: A | Discharge status: Home / Self Care | Payer: 59 | Source: Ambulatory Visit | Attending: Obstetrics and Gynecology | Admitting: Obstetrics and Gynecology

## 2016-01-08 DIAGNOSIS — E039 Hypothyroidism, unspecified: Secondary | ICD-10-CM | POA: Diagnosis present

## 2016-01-08 DIAGNOSIS — Z3A39 39 weeks gestation of pregnancy: Secondary | ICD-10-CM

## 2016-01-08 DIAGNOSIS — K52831 Collagenous colitis: Secondary | ICD-10-CM | POA: Diagnosis present

## 2016-01-08 DIAGNOSIS — Z8249 Family history of ischemic heart disease and other diseases of the circulatory system: Secondary | ICD-10-CM | POA: Diagnosis not present

## 2016-01-08 DIAGNOSIS — O99284 Endocrine, nutritional and metabolic diseases complicating childbirth: Secondary | ICD-10-CM | POA: Diagnosis present

## 2016-01-08 LAB — CBC
HCT: 35.6 % — ABNORMAL LOW (ref 36.0–46.0)
Hemoglobin: 11.8 g/dL — ABNORMAL LOW (ref 12.0–15.0)
MCH: 26.6 pg (ref 26.0–34.0)
MCHC: 33.1 g/dL (ref 30.0–36.0)
MCV: 80.2 fL (ref 78.0–100.0)
PLATELETS: 179 10*3/uL (ref 150–400)
RBC: 4.44 MIL/uL (ref 3.87–5.11)
RDW: 13.7 % (ref 11.5–15.5)
WBC: 15.7 10*3/uL — ABNORMAL HIGH (ref 4.0–10.5)

## 2016-01-08 LAB — PREPARE RBC (CROSSMATCH)

## 2016-01-08 LAB — RPR: RPR: NONREACTIVE

## 2016-01-08 MED ORDER — ZOLPIDEM TARTRATE 5 MG PO TABS: 5.0000 mg | ORAL_TABLET | Freq: Every evening | ORAL | Status: DC | PRN

## 2016-01-08 MED ORDER — LACTATED RINGERS IV SOLN
INTRAVENOUS | Status: DC
  Administered 2016-01-08: 500 mL via INTRAVENOUS
  Administered 2016-01-08: 01:00:00 via INTRAVENOUS

## 2016-01-08 MED ORDER — TETANUS-DIPHTH-ACELL PERTUSSIS 5-2.5-18.5 LF-MCG/0.5 IM SUSP: 0.5000 mL | Freq: Once | INTRAMUSCULAR | Status: DC

## 2016-01-08 MED ORDER — IBUPROFEN 600 MG PO TABS
600.0000 mg | ORAL_TABLET | Freq: Four times a day (QID) | ORAL | Status: DC
  Administered 2016-01-08 – 2016-01-09 (×5): 600 mg via ORAL
  Filled 2016-01-08 (×6): qty 1

## 2016-01-08 MED ORDER — OXYCODONE-ACETAMINOPHEN 5-325 MG PO TABS: 1.0000 | ORAL_TABLET | ORAL | Status: DC | PRN

## 2016-01-08 MED ORDER — DIPHENHYDRAMINE HCL 25 MG PO CAPS: 25.0000 mg | ORAL_CAPSULE | Freq: Four times a day (QID) | ORAL | Status: DC | PRN

## 2016-01-08 MED ORDER — EPHEDRINE 5 MG/ML INJ
10.0000 mg | INTRAVENOUS | Status: DC | PRN
  Filled 2016-01-08: qty 2

## 2016-01-08 MED ORDER — SENNOSIDES-DOCUSATE SODIUM 8.6-50 MG PO TABS
2.0000 | ORAL_TABLET | ORAL | Status: DC
  Administered 2016-01-09: 2 via ORAL
  Filled 2016-01-08: qty 2

## 2016-01-08 MED ORDER — TERBUTALINE SULFATE 1 MG/ML IJ SOLN
0.2500 mg | Freq: Once | INTRAMUSCULAR | Status: DC | PRN
  Filled 2016-01-08: qty 1

## 2016-01-08 MED ORDER — ONDANSETRON HCL 4 MG PO TABS: 4.0000 mg | ORAL_TABLET | ORAL | Status: DC | PRN

## 2016-01-08 MED ORDER — LIDOCAINE HCL (PF) 1 % IJ SOLN
30.0000 mL | INTRAMUSCULAR | Status: DC | PRN
  Filled 2016-01-08: qty 30

## 2016-01-08 MED ORDER — OXYTOCIN BOLUS FROM INFUSION
500.0000 mL | INTRAVENOUS | Status: DC
  Administered 2016-01-08: 500 mL via INTRAVENOUS

## 2016-01-08 MED ORDER — ONDANSETRON HCL 4 MG/2ML IJ SOLN: 4.0000 mg | INTRAMUSCULAR | Status: DC | PRN

## 2016-01-08 MED ORDER — OXYCODONE-ACETAMINOPHEN 5-325 MG PO TABS: 2.0000 | ORAL_TABLET | ORAL | Status: DC | PRN

## 2016-01-08 MED ORDER — LACTATED RINGERS IV SOLN: 500.0000 mL | INTRAVENOUS | Status: DC | PRN

## 2016-01-08 MED ORDER — SIMETHICONE 80 MG PO CHEW: 80.0000 mg | CHEWABLE_TABLET | ORAL | Status: DC | PRN

## 2016-01-08 MED ORDER — ONDANSETRON HCL 4 MG/2ML IJ SOLN
4.0000 mg | Freq: Four times a day (QID) | INTRAMUSCULAR | Status: DC | PRN
  Filled 2016-01-08: qty 2

## 2016-01-08 MED ORDER — ACETAMINOPHEN 325 MG PO TABS
650.0000 mg | ORAL_TABLET | ORAL | Status: DC | PRN
  Administered 2016-01-08: 650 mg via ORAL
  Filled 2016-01-08: qty 2

## 2016-01-08 MED ORDER — ONDANSETRON HCL 4 MG/2ML IJ SOLN
4.0000 mg | Freq: Four times a day (QID) | INTRAMUSCULAR | Status: DC
  Administered 2016-01-08: 4 mg via INTRAVENOUS

## 2016-01-08 MED ORDER — BENZOCAINE-MENTHOL 20-0.5 % EX AERO: 1.0000 "application " | INHALATION_SPRAY | CUTANEOUS | Status: DC | PRN

## 2016-01-08 MED ORDER — LEVOTHYROXINE SODIUM 125 MCG PO TABS
125.0000 ug | ORAL_TABLET | Freq: Every day | ORAL | Status: DC
Start: 2016-01-08 — End: 2016-01-09
  Filled 2016-01-08 (×3): qty 1

## 2016-01-08 MED ORDER — DIBUCAINE 1 % RE OINT: 1.0000 "application " | TOPICAL_OINTMENT | RECTAL | Status: DC | PRN

## 2016-01-08 MED ORDER — DIPHENHYDRAMINE HCL 50 MG/ML IJ SOLN: 12.5000 mg | INTRAMUSCULAR | Status: DC | PRN

## 2016-01-08 MED ORDER — OXYTOCIN 10 UNIT/ML IJ SOLN
2.5000 [IU]/h | INTRAVENOUS | Status: DC
  Administered 2016-01-08: 2.5 [IU]/h via INTRAVENOUS

## 2016-01-08 MED ORDER — OXYTOCIN 10 UNIT/ML IJ SOLN
1.0000 m[IU]/min | INTRAVENOUS | Status: DC
  Administered 2016-01-08: 2 m[IU]/min via INTRAVENOUS
  Filled 2016-01-08: qty 4

## 2016-01-08 MED ORDER — ACETAMINOPHEN 325 MG PO TABS: 650.0000 mg | ORAL_TABLET | ORAL | Status: DC | PRN

## 2016-01-08 MED ORDER — LANOLIN HYDROUS EX OINT: TOPICAL_OINTMENT | CUTANEOUS | Status: DC | PRN

## 2016-01-08 MED ORDER — PRENATAL MULTIVITAMIN CH
1.0000 | ORAL_TABLET | Freq: Every day | ORAL | Status: DC
  Administered 2016-01-09: 1 via ORAL
  Filled 2016-01-08: qty 1

## 2016-01-08 MED ORDER — PHENYLEPHRINE 40 MCG/ML (10ML) SYRINGE FOR IV PUSH (FOR BLOOD PRESSURE SUPPORT)
80.0000 ug | PREFILLED_SYRINGE | INTRAVENOUS | Status: DC | PRN
  Filled 2016-01-08: qty 2
  Filled 2016-01-08: qty 20

## 2016-01-08 MED ORDER — FENTANYL 2.5 MCG/ML BUPIVACAINE 1/10 % EPIDURAL INFUSION (WH - ANES)
14.0000 mL/h | INTRAMUSCULAR | Status: DC | PRN
  Administered 2016-01-08: 14 mL/h via EPIDURAL
  Filled 2016-01-08: qty 125

## 2016-01-08 MED ORDER — LIDOCAINE HCL (PF) 1 % IJ SOLN
INTRAMUSCULAR | Status: DC | PRN
  Administered 2016-01-08: 4 mL

## 2016-01-08 MED ORDER — WITCH HAZEL-GLYCERIN EX PADS: 1.0000 "application " | MEDICATED_PAD | CUTANEOUS | Status: DC | PRN

## 2016-01-08 MED ORDER — CITRIC ACID-SODIUM CITRATE 334-500 MG/5ML PO SOLN: 30.0000 mL | ORAL | Status: DC | PRN

## 2016-01-08 NOTE — Lactation Note (Signed)
This note was copied from the chart of Emily Aneisha Skyles. Lactation Consultation Note  Patient Name: Emily [name] ZOXWR'U Date: 01/08/2016 Reason for consult: Initial assessment Baby 10 hours of life. Mom has a "do not disturb" sign on door. Mom lying on her side in the bed. Offered to assist mom with latching baby at breast, but mom declined. Enc mom to offer lots of STS and call out for assistance when baby cueing to nurse. Discussed with mom the importance of keeping her thyroid hormones in balance and enc making sure her HCP follows closely. Mom aware of OP/BFSG and LC phone line assistance after D/C. Discussed assessment and interventions with patient's bedside nurse, Judeth Cornfield, RN.   Maternal Data    Feeding    LATCH Score/Interventions                      Lactation Tools Discussed/Used     Consult Status Consult Status: Follow-up Date: 01/09/16 Follow-up type: In-patient    Geralynn Ochs 01/08/2016, 3:18 PM

## 2016-01-08 NOTE — Progress Notes (Signed)
Patient ID: Emily [name], female   DOB: Apr 17, 1981, 35 y.o.   MRN: [id number] Pt lying in bed talking comfortably.  States had episode of feeling dizzy and like she was going to pass out, resolved for moment No current HA VSS bleeding WNL  Pt with h/o atypical migraines, w/u last pregnancy May need neuro consult if persist as frequently now s/p delivery Advised pt to try to sleep today and see if helps

## 2016-01-08 NOTE — Anesthesia Postprocedure Evaluation (Signed)
Anesthesia Post Note  Patient: Emily [name]  Procedure(s) Performed: * No procedures listed *  Patient location during evaluation: Mother Baby Anesthesia Type: Epidural Level of consciousness: awake and alert and oriented Pain management: pain level controlled Vital Signs Assessment: post-procedure vital signs reviewed and stable Respiratory status: spontaneous breathing Cardiovascular status: blood pressure returned to baseline Postop Assessment: no headache, no backache, patient able to bend at knees, no signs of nausea or vomiting and adequate PO intake Anesthetic complications: no    Last Vitals:  Filed Vitals:   01/08/16 0600 01/08/16 0640  BP: 117/78 109/67  Pulse: 106 98  Temp:  37.1 C  Resp: 18 18    Last Pain:  Filed Vitals:   01/08/16 0652  PainSc: 0-No pain                 Jkai Arwood

## 2016-01-08 NOTE — Anesthesia Procedure Notes (Signed)
Epidural Patient location during procedure: OB Start time: 01/08/2016 1:44 AM End time: 01/08/2016 1:50 AM  Staffing Anesthesiologist: Shona Simpson D Performed by: anesthesiologist   Preanesthetic Checklist Completed: patient identified, site marked, surgical consent, pre-op evaluation, timeout performed, IV checked, risks and benefits discussed and monitors and equipment checked  Epidural Patient position: sitting Prep: ChloraPrep Patient monitoring: heart rate, continuous pulse ox and blood pressure Approach: midline Location: L3-L4 Injection technique: LOR saline  Needle:  Needle type: Tuohy  Needle gauge: 17 G Needle length: 9 cm Catheter type: closed end flexible Catheter size: 20 Guage Test dose: negative and 1.5% lidocaine  Assessment Events: blood not aspirated, injection not painful, no injection resistance and no paresthesia  Additional Notes LOR @ 5  Patient identified. Risks/Benefits/Options discussed with patient including but not limited to bleeding, infection, nerve damage, paralysis, failed block, incomplete pain control, headache, blood pressure changes, nausea, vomiting, reactions to medications, itching and postpartum back pain. Confirmed with bedside nurse the patient's most recent platelet count. Confirmed with patient that they are not currently taking any anticoagulation, have any bleeding history or any family history of bleeding disorders. Patient expressed understanding and wished to proceed. All questions were answered. Sterile technique was used throughout the entire procedure. Please see nursing notes for vital signs. Test dose was given through epidural catheter and negative prior to continuing to dose epidural or start infusion. Warning signs of high block given to the patient including shortness of breath, tingling/numbness in hands, complete motor block, or any concerning symptoms with instructions to call for help. Patient was given instructions on  fall risk and not to get out of bed. All questions and concerns addressed with instructions to call with any issues or inadequate analgesia.    Reason for block:procedure for pain

## 2016-01-08 NOTE — Progress Notes (Signed)
Patient ID: Emily [name], female   DOB: 24-May-1981, 35 y.o.   MRN: [id number] Pt arrived on unit and having only sporadic contractions  FHR category 1  afeb vss Cervix 50/3/-2  AROM scant clear, blood tinged Will begin pitocin, IUPC if needed Epidural prn Type and Cross given antibodies in blood

## 2016-01-08 NOTE — Progress Notes (Signed)
Upon assessing patient at her one hour check, while obtaining vital signs, patient states she began to feel extremely dizzy, having hearing loss and tongue began to feel numb. Patient also stated her right arm became so weak she could barely lift it up. Laid patient in supine position and put cool rag on forehead. Patient has not eaten anything yet since delivery and states she is very tired. She had also been looking down focusing intently on her phone. Husband also states she has been having atypical migraines through pregnancy in which patient has some of these symptoms. Fundus firm and two below, lochia scant. Vital signs stable. Patient slowly began to feel a little better and was able to sit up slightly and drink some ginger ale and eat a few crackers. Called and notified Dr. Ellyn Hack who stated she has a history of these symptoms with migraines and will come by and see patient with rounds. Encouraged to call if patient has symptoms again. Earl Gala, Linda Hedges [place]

## 2016-01-08 NOTE — Anesthesia Preprocedure Evaluation (Signed)
Anesthesia Evaluation  Patient identified by MRN, date of birth, ID band Patient awake    Reviewed: Allergy & Precautions, NPO status , Patient's Chart, lab work & pertinent test results  Airway Mallampati: I  TM Distance: >3 FB Neck ROM: Full    Dental  (+) Teeth Intact   Pulmonary    breath sounds clear to auscultation       Cardiovascular negative cardio ROS   Rhythm:Regular Rate:Normal     Neuro/Psych  Headaches, negative psych ROS   GI/Hepatic negative GI ROS, Neg liver ROS,   Endo/Other  Hypothyroidism   Renal/GU negative Renal ROS  negative genitourinary   Musculoskeletal negative musculoskeletal ROS (+)   Abdominal   Peds negative pediatric ROS (+)  Hematology   Anesthesia Other Findings   Reproductive/Obstetrics (+) Pregnancy                             Lab Results  Component Value Date   WBC 15.7* 01/08/2016   HGB 11.8* 01/08/2016   HCT 35.6* 01/08/2016   MCV 80.2 01/08/2016   PLT 179 01/08/2016   No results found for: INR, PROTIME   Anesthesia Physical Anesthesia Plan  ASA: II  Anesthesia Plan: Epidural   Post-op Pain Management:    Induction:   Airway Management Planned:   Additional Equipment:   Intra-op Plan:   Post-operative Plan:   Informed Consent: I have reviewed the patients History and Physical, chart, labs and discussed the procedure including the risks, benefits and alternatives for the proposed anesthesia with the patient or authorized representative who has indicated his/her understanding and acceptance.     Plan Discussed with:   Anesthesia Plan Comments:         Anesthesia Quick Evaluation

## 2016-01-09 LAB — CBC
HCT: 27.3 % — ABNORMAL LOW (ref 36.0–46.0)
HEMOGLOBIN: 9.2 g/dL — AB (ref 12.0–15.0)
MCH: 27.2 pg (ref 26.0–34.0)
MCHC: 33.7 g/dL (ref 30.0–36.0)
MCV: 80.8 fL (ref 78.0–100.0)
PLATELETS: 142 10*3/uL — AB (ref 150–400)
RBC: 3.38 MIL/uL — AB (ref 3.87–5.11)
RDW: 14 % (ref 11.5–15.5)
WBC: 12.3 10*3/uL — AB (ref 4.0–10.5)

## 2016-01-09 MED ORDER — PRENATAL MULTIVITAMIN CH: 1.0000 | ORAL_TABLET | Freq: Every day | ORAL | Status: DC

## 2016-01-09 MED ORDER — OXYCODONE-ACETAMINOPHEN 5-325 MG PO TABS: 1.0000 | ORAL_TABLET | Freq: Four times a day (QID) | ORAL | Status: DC | PRN

## 2016-01-09 MED ORDER — IBUPROFEN 800 MG PO TABS: 800.0000 mg | ORAL_TABLET | Freq: Three times a day (TID) | ORAL | Status: DC | PRN

## 2016-01-09 NOTE — Progress Notes (Addendum)
Post Partum Day 1 Subjective: no complaints, up ad lib, voiding, tolerating PO and nl lochia, pain controlled  Objective: Blood pressure 96/61, pulse 89, temperature 98.3 F (36.8 C), temperature source Oral, resp. rate 18, height  (1.676 m), weight 62.143 kg (137 lb), SpO2 98 %, unknown if currently breastfeeding.  Physical Exam:  General: alert and no distress Lochia: appropriate Uterine Fundus: firm   Recent Labs  01/08/16 0028 01/09/16 0511  HGB 11.8* 9.2*  HCT 35.6* 27.3*    Assessment/Plan: Plan for discharge tomorrow, Breastfeeding and Lactation consult.  Routine care.    Pt desires d/c to home today, will d/c with motrin, percocet and PNV   LOS: 1 day   Bovard-Stuckert, Khye Hochstetler 01/09/2016, 8:26 AM

## 2016-01-09 NOTE — Lactation Note (Signed)
This note was copied from the chart of Emily Makaela Cando. Lactation Consultation Note  Mother's nipples sore.  Baby has tight suck.  Reviewed suck training and recommend doing 5x day. Reviewed engorgement care and monitoring voids/stools. Mother has comfort gels.  Encouraged ebm and coconut oil.   Patient Name: Emily [name] ZOXWR'U Date: 01/09/2016 Reason for consult: Follow-up assessment   Maternal Data    Feeding Feeding Type: Breast Fed Length of feed: 20 min  LATCH Score/Interventions                      Lactation Tools Discussed/Used     Consult Status Consult Status: Complete    Hardie Pulley 01/09/2016, 9:13 AM

## 2016-01-09 NOTE — Discharge Summary (Signed)
OB Discharge Summary     Patient Name: Emily [name] DOB: February 06, 1981 MRN: [id number]  Date of admission: 01/08/2016 Delivering MD: Huel Cote   Date of discharge: 01/09/2016  Admitting diagnosis: INDUCTION Intrauterine pregnancy: [redacted]w[redacted]d     Secondary diagnosis:  Active Problems:   Indication for care in labor and delivery, antepartum   NSVD (normal spontaneous vaginal delivery)  Additional problems: atypical migraine     Discharge diagnosis: Term Pregnancy Delivered                                                                                                Post partum procedures:N/A  Augmentation: AROM and Pitocin  Complications: None  Hospital course:  Induction of Labor With Vaginal Delivery   35 y.o. yo G3P2003 at [redacted]w[redacted]d was admitted to the hospital 01/08/2016 for induction of labor.  Indication for induction: Favorable cervix at term.  Patient had an uncomplicated labor course as follows: Membrane Rupture Time/Date: 12:31 AM ,01/08/2016   Intrapartum Procedures: Episiotomy: None [1]                                         Lacerations:  2nd degree [3]  Patient had delivery of a Viable infant.  Information for the patient's newborn:  Arlethia, Basso [[id number]]  Delivery Method: Vaginal, Spontaneous Delivery (Filed from Delivery Summary)   01/08/2016  Details of delivery can be found in separate delivery note.  Patient had a routine postpartum course. Patient is discharged home 01/09/2016.   Physical exam  Filed Vitals:   01/08/16 0745 01/08/16 1130 01/08/16 1750 01/09/16 0544  BP: 97/54 113/69 90/63 96/61   Pulse: 97 102 92 89  Temp: 98.7 F (37.1 C) 99.1 F (37.3 C) 98.4 F (36.9 C) 98.3 F (36.8 C)  TempSrc: Oral Oral Oral Oral  Resp: Height:      Weight:      SpO2: 100% 98%     General: alert and no distress Lochia: appropriate Uterine Fundus: firm Labs: Lab Results  Component Value Date   WBC 12.3* 01/09/2016   HGB 9.2*  01/09/2016   HCT 27.3* 01/09/2016   MCV 80.8 01/09/2016   PLT 142* 01/09/2016   CMP Latest Ref Rng 11/13/2012  Glucose 70 - 99 mg/dL 782(N)  BUN 6 - 23 mg/dL 4(L)  Creatinine 5.62 - 1.10 mg/dL 1.30  Sodium 865 - 784 mEq/L 134(L)  Potassium 3.5 - 5.1 mEq/L 3.3(L)  Chloride 96 - 112 mEq/L 102  CO2 19 - 32 mEq/L 22  Calcium 8.4 - 10.5 mg/dL 8.4  Total Protein 6.0 - 8.3 g/dL -  Total Bilirubin 0.3 - 1.2 mg/dL -  Alkaline Phos 39 - 696 U/L -  AST 0 - 37 U/L -  ALT 0 - 35 U/L -    Discharge instruction: per After Visit Summary and "Baby and Me Booklet".  After visit meds:    Medication List    STOP taking these medications  levothyroxine 150 MCG tablet  Commonly known as:  SYNTHROID, LEVOTHROID      TAKE these medications        acetaminophen 325 MG tablet  Commonly known as:  TYLENOL  Take 650 mg by mouth every 6 (six) hours as needed for headache.     Budesonide 9 MG Tb24  Take 1 tablet by mouth daily.     butalbital-acetaminophen-caffeine 50-325-40 MG tablet  Commonly known as:  FIORICET, ESGIC  Take 1 tablet by mouth 2 (two) times daily as needed for headache.     ibuprofen 800 MG tablet  Commonly known as:  ADVIL,MOTRIN  Take 1 tablet (800 mg total) by mouth every 8 (eight) hours as needed.     oxyCODONE-acetaminophen 5-325 MG tablet  Commonly known as:  ROXICET  Take 1-2 tablets by mouth every 6 (six) hours as needed for severe pain.     prenatal multivitamin Tabs tablet  Take 1 tablet by mouth daily.        Diet: routine diet  Activity: Advance as tolerated. Pelvic rest for 6 weeks.   Outpatient follow up:6 weeks Follow up Appt:No future appointments. Follow up Visit:No Follow-up on file.  Postpartum contraception: Not Discussed  Newborn Data: Live born female  Birth Weight: 6 lb 13.7 oz (3110 g) APGAR: 9, 9  Baby Feeding: Breast Disposition:home with mother   01/09/2016 Sherian Rein, MD

## 2016-01-10 LAB — TYPE AND SCREEN
ABO/RH(D): B POS
ANTIBODY SCREEN: POSITIVE
DAT, IgG: NEGATIVE
Unit division: 0
Unit division: 0

## 2016-01-27 ENCOUNTER — Other Ambulatory Visit: Payer: Self-pay | Admitting: Neurology

## 2016-01-27 DIAGNOSIS — R200 Anesthesia of skin: Secondary | ICD-10-CM

## 2016-01-27 DIAGNOSIS — R519 Headache, unspecified: Secondary | ICD-10-CM

## 2016-01-27 DIAGNOSIS — H547 Unspecified visual loss: Secondary | ICD-10-CM

## 2016-01-27 DIAGNOSIS — R51 Headache: Principal | ICD-10-CM

## 2016-02-01 ENCOUNTER — Other Ambulatory Visit: Payer: 59

## 2016-02-02 ENCOUNTER — Other Ambulatory Visit: Payer: 59

## 2016-02-08 ENCOUNTER — Ambulatory Visit
Admission: RE | Admit: 2016-02-08 | Discharge: 2016-02-08 | Disposition: A | Discharge status: Home / Self Care | Payer: 59 | Source: Ambulatory Visit | Attending: Neurology | Admitting: Neurology

## 2016-02-08 DIAGNOSIS — R51 Headache: Principal | ICD-10-CM

## 2016-02-08 DIAGNOSIS — H547 Unspecified visual loss: Secondary | ICD-10-CM

## 2016-02-08 DIAGNOSIS — R200 Anesthesia of skin: Secondary | ICD-10-CM

## 2016-02-08 DIAGNOSIS — R519 Headache, unspecified: Secondary | ICD-10-CM

## 2016-02-12 ENCOUNTER — Ambulatory Visit
Admission: RE | Admit: 2016-02-12 | Discharge: 2016-02-12 | Disposition: A | Discharge status: Home / Self Care | Payer: 59 | Source: Ambulatory Visit | Attending: Neurology | Admitting: Neurology

## 2016-02-12 DIAGNOSIS — R200 Anesthesia of skin: Secondary | ICD-10-CM

## 2016-02-12 DIAGNOSIS — R519 Headache, unspecified: Secondary | ICD-10-CM

## 2016-02-12 DIAGNOSIS — R51 Headache: Principal | ICD-10-CM

## 2016-02-12 DIAGNOSIS — H547 Unspecified visual loss: Secondary | ICD-10-CM

## 2016-03-31 ENCOUNTER — Encounter: Payer: Self-pay | Admitting: Osteopathic Medicine

## 2016-03-31 ENCOUNTER — Ambulatory Visit (INDEPENDENT_AMBULATORY_CARE_PROVIDER_SITE_OTHER): Payer: 59 | Admitting: Osteopathic Medicine

## 2016-03-31 VITALS — BP 105/79 | HR 92 | Ht 71.0 in | Wt 114.0 lb

## 2016-03-31 DIAGNOSIS — F418 Other specified anxiety disorders: Secondary | ICD-10-CM

## 2016-03-31 DIAGNOSIS — Z3009 Encounter for other general counseling and advice on contraception: Secondary | ICD-10-CM

## 2016-03-31 DIAGNOSIS — Z9889 Other specified postprocedural states: Secondary | ICD-10-CM | POA: Insufficient documentation

## 2016-03-31 DIAGNOSIS — Z309 Encounter for contraceptive management, unspecified: Secondary | ICD-10-CM | POA: Insufficient documentation

## 2016-03-31 DIAGNOSIS — G43109 Migraine with aura, not intractable, without status migrainosus: Secondary | ICD-10-CM

## 2016-03-31 DIAGNOSIS — Z8719 Personal history of other diseases of the digestive system: Secondary | ICD-10-CM

## 2016-03-31 DIAGNOSIS — F419 Anxiety disorder, unspecified: Secondary | ICD-10-CM

## 2016-03-31 DIAGNOSIS — Z13220 Encounter for screening for lipoid disorders: Secondary | ICD-10-CM | POA: Insufficient documentation

## 2016-03-31 DIAGNOSIS — Z79899 Other long term (current) drug therapy: Secondary | ICD-10-CM | POA: Insufficient documentation

## 2016-03-31 DIAGNOSIS — G43909 Migraine, unspecified, not intractable, without status migrainosus: Secondary | ICD-10-CM | POA: Diagnosis not present

## 2016-03-31 DIAGNOSIS — Z124 Encounter for screening for malignant neoplasm of cervix: Secondary | ICD-10-CM | POA: Insufficient documentation

## 2016-03-31 DIAGNOSIS — E039 Hypothyroidism, unspecified: Secondary | ICD-10-CM

## 2016-03-31 DIAGNOSIS — F32A Depression, unspecified: Secondary | ICD-10-CM

## 2016-03-31 DIAGNOSIS — F329 Major depressive disorder, single episode, unspecified: Secondary | ICD-10-CM

## 2016-03-31 DIAGNOSIS — Z862 Personal history of diseases of the blood and blood-forming organs and certain disorders involving the immune mechanism: Secondary | ICD-10-CM

## 2016-03-31 NOTE — Progress Notes (Signed)
HPI: Emily [name] is a 35 y.o. female who [facility] today for chief complaint of:  Chief Complaint  Patient presents with  . Establish Care    "New GP, moved houses, [coordinate] all my specialists"     HYPOTHYROID - Usually well-managed on Unithyroid.   GASTROENTEROLOGY - Seeing [facility], seen in past by multiple specialists at tertiary [facility] [place] [place] On Uceris and Lomotil 2.5. Follows with GI for medications. Sees GI about once per year.   OBGYN - still sees OB, hasn't gotten period back since recent birth, breast feedng but not producing much. Hx twins and singleton birth. No GHTN, no GDM.   NEURO - Migraines associated with hormones. Just had a few MRI's. Follow[facility] with [facility]itt [facility]. Half body goes numb, lose vision and hearing. Sees Dr. Clarisse GougeLewit every few months, no medications for migraine at the moment, has been on Fioricet int he past.   PSYCH - Sertaline 25 daily, and Xanax 0.25 prn using rarely. Was previously on several others but Sertraline works ok in controlling her symptoms. No hx hospitalizations or suicidal thoughts.   ANEMIA - CBC Fast[employer] recently. Pt reports normal.    Past medical, social and family history reviewed: Past Medical History  Diagnosis Date  . Celiac disease   . Thyroid disease   . Collagenous colitis   . Hypothyroidism   . Anemia     history  . Bartholin cyst 12/27/2011  . Infertility, female   . Headache(784.0)     complex migraines with cardiac and neuro symptoms during pregnancy  . Dichorionic diamniotic twin pregnancy 12/01/2012  . SVD (spontaneous vaginal delivery) 12/02/2012  . Delivery of twins, both live 12/02/2012   Past Surgical History  Procedure Laterality Date  . Tonsillectomy    . Rhinoplasty    . Upper gastrointestinal endoscopy    . Colonoscopy    . Bartholin cyst marsupialization  12/28/2011    Procedure: BARTHOLIN CYST MARSUPIALIZATION;   Surgeon: Sherron MondayJody Bovard, MD;  Location: [facility];  Service: Gynecology;  Laterality: N/A;  . Wisdom tooth extraction     Social History  Substance Use Topics  . Smoking status: Never Smoker   . Smokeless tobacco: Never Used  . Alcohol Use: No     Comment: socially when not pregnant   Family History  Problem Relation Age of Onset  . Clotting disorder Paternal Uncle   . Heart attack Maternal Grandmother   . Rashes / Skin problems Paternal Grandmother     Current Outpatient Prescriptions  Medication Sig Dispense Refill  . acetaminophen (TYLENOL) 325 MG tablet Take 650 mg by mouth every 6 (six) hours as needed for headache.    . Budesonide 9 MG TB24 Take 1 tablet by mouth daily.    Marland Kitchen. ibuprofen (ADVIL,MOTRIN) 800 MG tablet Take 1 tablet (800 mg total) by mouth every 8 (eight) hours as needed. 45 tablet 1  . levothyroxine (UNITHROID DIRECT) 125 MCG tablet Take 125 mcg by mouth daily before breakfast.    . sertraline (ZOLOFT) 25 MG tablet Take 25 mg by mouth as needed.     No current facility-administered medications for this visit.   Allergies  Allergen Reactions  . Rifaximin Itching and Other (See Comments)    Reaction:  Itching of throat  . Gluten Meal Diarrhea, Nausea And Vomiting and Other (See Comments)    Pt has celiac disease  . Levothyroxine Sodium Diarrhea  Only has GI reaction to synthroid      Review of Systems: CONSTITUTIONAL:  No  fever, no chills, No  unintentional weight changes HEAD/EYES/EARS/NOSE/THROAT: (+) headache - "basilar (stroke symptoms) migraines", no vision change, no hearing change, No  sore throat, No  sinus pressure CARDIAC: No  chest pain, No  pressure, No palpitations, No  orthopnea RESPIRATORY: No  cough, No  shortness of breath/wheeze GASTROINTESTINAL: (+) nausea, No  vomiting, No  abdominal pain, No  blood in stool, No  diarrhea, No  constipation  MUSCULOSKELETAL: No  myalgia/arthralgia GENITOURINARY: No  incontinence, No  abnormal genital  bleeding/discharge SKIN: No  rash/wounds/concerning lesions HEM/ONC: No  easy bruising/bleeding, No  abnormal lymph node ENDOCRINE: No polyuria/polydipsia/polyphagia, No  heat/cold intolerance, "hypothyroid" NEUROLOGIC: No  weakness, No  dizziness, No  slurred speech PSYCHIATRIC: (+) concerns with depression, (+) concerns with anxiety, No sleep problems  Exam:  BP 105/79 mmHg  Pulse 92  Ht  (1.803 m)  Wt 114 lb (51.71 kg)  BMI 15.91 kg/m2 Constitutional: VS see above. General Appearance: alert, well-developed, well-nourished, NAD Eyes: Normal lids and conjunctive, non-icteric sclera, PERRLA Ears, Nose, Mouth, Throat: MMM, Normal external inspection ears/nares/mouth/lips/gums, TM normal bilaterally. Pharynx no erythema, no exudate.  Neck: No masses, trachea midline. No thyroid enlargement/tenderness/mass appreciated. No lymphadenopathy Respiratory: Normal respiratory effort. no wheeze, no rhonchi, no rales Cardiovascular: S1/S2 normal, no murmur, no rub/gallop auscultated. RRR. No lower extremity edema. Gastrointestinal: Nontender, no masses. No hepatomegaly, no splenomegaly. No hernia appreciated. Bowel sounds normal. Rectal exam deferred.  Musculoskeletal: Gait normal. No clubbing/cyanosis of digits.  Neurological: No cranial nerve deficit on limited exam. Motor and sensation intact and symmetric Skin: warm, dry, intact. No rash/ulcer. No concerning nevi or subq nodules on limited exam.   Psychiatric: Normal judgment/insight. Normal mood and affect.   No results found for this or any previous visit (from the past 72 hour(s)).    ASSESSMENT/PLAN: See patient instructions.   Hypothyroidism, unspecified hypothyroidism type - Plan: TSH  History of celiac disease - Following with GI, requests a forward [place] records  Complicated migraine - Following with neurology, requests they forward [place] records  Anxiety and depression - Controlled on sertraline, patient states she takes Xanax  very sparingly  History of anemia - Records reviewed, low hemoglobin but this was right after delivery, would recommend repeat if she hasn't had this done, she says that last was normal - Plan: CBC with Differential/Platelet  Lipid screening - Plan for baseline lipid screening in 6 months - Plan: Lipid panel  Medication management - Plan: COMPLETE METABOLIC PANEL WITH GFR, TSH, CBC with Differential/Platelet  Encounter for other general counseling or advice on contraception - Abstinence for the moment, advised no estrogen pills given her migraine history, recommended IUDs or arm implant, patient think about it   Return in about 6 months (around 09/30/2016), or sooner if needed, for ROUTINE CARE.

## 2016-03-31 NOTE — Patient Instructions (Signed)
Plan to follow-up every 6 months for management/monitoring of chronic medical issues. Please don't hesitate to make an appointment sooner if you're having any acute concerns or problems!  Please let your pharmacy know when you are running low on medications/refills (do not wait until you are out of medicines). Your pharmacy will send our office a request for the appropriate medications. Please allow our office 2-3 business days to process the needed refills.   At any visits to any of your specialists, please give them our clinic information so that they can forward us any records, including any tests which are done or changes to your medications. This allows all your physicians to communicate effectively, putting your primary care doctor at the center of your medical care and allowing us to effectively coordinate your care.   Let's plan to follow-up here in the office in 6 months. If you would like to, you can get lab work done a few days before that visit so that we can go over the results in person at your appointment. You do not need an appointment to go downstairs to the lab for blood draws, they should have the orders in the system for you and if there is any problem they can always call upstairs and we can solve it pretty quickly and get her blood drawn that day.   Please let us know if there is anything else we can do for you. Take care! -Dr. Mervyn SkeetersA.

## 2016-05-12 ENCOUNTER — Ambulatory Visit: Payer: 59 | Admitting: Osteopathic Medicine

## 2016-05-12 ENCOUNTER — Encounter: Payer: Self-pay | Admitting: Osteopathic Medicine

## 2016-05-12 ENCOUNTER — Ambulatory Visit (INDEPENDENT_AMBULATORY_CARE_PROVIDER_SITE_OTHER): Payer: 59 | Admitting: Osteopathic Medicine

## 2016-05-12 VITALS — BP 118/83 | HR 102 | Ht 66.0 in | Wt 110.0 lb

## 2016-05-12 DIAGNOSIS — L219 Seborrheic dermatitis, unspecified: Secondary | ICD-10-CM

## 2016-05-12 DIAGNOSIS — L218 Other seborrheic dermatitis: Secondary | ICD-10-CM | POA: Diagnosis not present

## 2016-05-12 DIAGNOSIS — R0989 Other specified symptoms and signs involving the circulatory and respiratory systems: Secondary | ICD-10-CM | POA: Diagnosis not present

## 2016-05-12 LAB — CBC WITH DIFFERENTIAL/PLATELET
BASOS PCT: 1 %
Basophils Absolute: 57 cells/uL (ref 0–200)
EOS PCT: 3 %
Eosinophils Absolute: 171 cells/uL (ref 15–500)
HCT: 37.8 % (ref 35.0–45.0)
HEMOGLOBIN: 12.4 g/dL (ref 11.7–15.5)
LYMPHS ABS: 1254 {cells}/uL (ref 850–3900)
Lymphocytes Relative: 22 %
MCH: 28.4 pg (ref 27.0–33.0)
MCHC: 32.8 g/dL (ref 32.0–36.0)
MCV: 86.7 fL (ref 80.0–100.0)
MONO ABS: 399 {cells}/uL (ref 200–950)
MPV: 11.2 fL (ref 7.5–12.5)
Monocytes Relative: 7 %
NEUTROS ABS: 3819 {cells}/uL (ref 1500–7800)
Neutrophils Relative %: 67 %
PLATELETS: 229 10*3/uL (ref 140–400)
RBC: 4.36 MIL/uL (ref 3.80–5.10)
RDW: 13.6 % (ref 11.0–15.0)
WBC: 5.7 10*3/uL (ref 3.8–10.8)

## 2016-05-12 MED ORDER — CLOBETASOL PROPIONATE 0.05 % EX CREA: 1.0000 "application " | TOPICAL_CREAM | Freq: Two times a day (BID) | CUTANEOUS | Status: DC

## 2016-05-12 MED ORDER — KETOCONAZOLE 1 % EX SHAM: MEDICATED_SHAMPOO | CUTANEOUS | Status: DC

## 2016-05-12 NOTE — Progress Notes (Signed)
HPI: Emily [name] is a 35 y.o. female who presents to [facility] today for chief complaint of:  Chief Complaint  Patient presents with  . OTHER    Patient complains of swollen lymp nodes    Lymph Node Pain . Location: left side of neck and behind ears . Quality: bumps/lymph nodes, sore . Severity: more severe pain lately . Duration: 3 months or so off an on  . Timing: intermittent . Assoc signs/symptoms: rash on back of neck, has been present for many years - has been on steroid cream for it as needed   Past medical, social and family history reviewed: Past Medical History  Diagnosis Date  . Celiac disease   . Thyroid disease   . Collagenous colitis   . Hypothyroidism   . Anemia     history  . Bartholin cyst 12/27/2011  . Infertility, female   . Headache(784.0)     complex migraines with cardiac and neuro symptoms during pregnancy  . Dichorionic diamniotic twin pregnancy 12/01/2012  . SVD (spontaneous vaginal delivery) 12/02/2012  . Delivery of twins, both live 12/02/2012   Past Surgical History  Procedure Laterality Date  . Tonsillectomy    . Rhinoplasty    . Upper gastrointestinal endoscopy    . Colonoscopy    . Bartholin cyst marsupialization  12/28/2011    Procedure: BARTHOLIN CYST MARSUPIALIZATION;  Surgeon: Sherron Monday, MD;  Location: [facility];  Service: Gynecology;  Laterality: N/A;  . Wisdom tooth extraction     Social History  Substance Use Topics  . Smoking status: Never Smoker   . Smokeless tobacco: Never Used  . Alcohol Use: No     Comment: socially when not pregnant   Family History  Problem Relation Age of Onset  . Clotting disorder Paternal Uncle   . Heart attack Maternal Grandmother   . Rashes / Skin problems Paternal Grandmother     Current Outpatient Prescriptions  Medication Sig Dispense Refill  . ALPRAZolam (XANAX) 0.25 MG tablet Take 0.25 mg by mouth daily as needed for anxiety (use sparingly to avoid  dependence).    . Budesonide 9 MG TB24 Take 1 tablet by mouth daily.    Marland Kitchen ibuprofen (ADVIL,MOTRIN) 800 MG tablet Take 1 tablet (800 mg total) by mouth every 8 (eight) hours as needed. 45 tablet 1  . levothyroxine (UNITHROID DIRECT) 125 MCG tablet Take 125 mcg by mouth daily before breakfast.    . sertraline (ZOLOFT) 25 MG tablet Take 25 mg by mouth daily.      No current facility-administered medications for this visit.   Allergies  Allergen Reactions  . Rifaximin Itching and Other (See Comments)    Reaction:  Itching of throat  . Gluten Meal Diarrhea, Nausea And Vomiting and Other (See Comments)    Pt has celiac disease  . Augmentin [Amoxicillin-Pot Clavulanate] Other (See Comments)    Itching throat - amoxicilin alone doesn't cause any problems   . Xifaxan [Rifaximin]   . Levothyroxine Sodium Diarrhea    Only has GI reaction to synthroid brand      Review of Systems: CONSTITUTIONAL:  No  fever, no chills, No  unintentional weight changes, no recent illness HEAD/EYES/EARS/NOSE/THROAT: No  headache, no vision change, no hearing change, No  sore throat, No  sinus pressure, just the painful lymph nodes as per HPI CARDIAC: No  chest pain RESPIRATORY: No  cough, No  shortness of breath/wheeze GASTROINTESTINAL: No  nausea, No  vomiting, No  abdominal pain MUSCULOSKELETAL: No  myalgia/arthralgia SKIN: Persistent rash on nape of neck - present many years, as per HPI, seems worse lately, otherwise No  rash/wounds/concerning lesions HEM/ONC: No  easy bruising/bleeding  Exam:  BP 118/83 mmHg  Pulse 102  Ht 5\' 6"  (1.676 m)  Wt 110 lb (49.896 kg)  BMI 17.76 kg/m2 Constitutional: VS see above. General Appearance: alert, well-developed, well-nourished, NAD Eyes: Normal lids and conjunctive, non-icteric sclera,  Ears, Nose, Mouth, Throat: MMM, Normal external inspection ears/nares/mouth/lips/gums, TM normal bilaterally. Pharynx no erythema, no exudate.  Neck: No masses, trachea midline. No  thyroid enlargement/tenderness/mass appreciated. Small palpable lymph nodes on L cervical chain x 3 - one mid-lateral neck, one base of lateral neck, one posterior to L ear. No supraclavicular nodes. Nodes are tender but not particularly large, <1cm diameter Respiratory: Normal respiratory effort. no wheeze, no rhonchi, no rales Cardiovascular: S1/S2 normal, no murmur, no rub/gallop auscultated. RRR.  Skin: Scaly erythematous patch on posterior neck extending into hair, excoriated, no ulceration or drainage - c/w seborrheic dermatitis or warm, dry, intact. No rash/ulcer. No concerning nevi or subq nodules on limited exam.   Psychiatric: Normal judgment/insight. Normal mood and affect. Oriented x3.    No results found for this or any previous visit (from the past 72 hour(s)).    ASSESSMENT/PLAN: Consider derm referral for second opinion re: rash if our treatment isn't working, I suspect lymph node reaction due to worsening rash/inflammation and not particularly compelled to get imaging of these LN at this time but may consider imaging biopsy if no improvement.   Seborrheic dermatitis of scalp - Plan: clobetasol cream (TEMOVATE) 0.05 %, KETOCONAZOLE, TOPICAL, 1 % SHAM  Lymph node symptom - Plan: CBC with Differential/Platelet     All questions were answered. Visit summary with updated medication list and pertinent instructions was printed for patient. ER/RTC precautions were reviewed with the patient. Return if symptoms worsen, or fail to improve 1 - 2 weeks.

## 2016-05-12 NOTE — Patient Instructions (Signed)
Will try to intensify treatment of the seborrheic rash, consider referral to dermatology if no improvement. If lymph nodes continue to bother you or if they get worse, we may consider sending you fpr ultrasound and possible biopsy of the lymph nodes. Please let me know if they've gotten better or worse within the next week or so.

## 2016-06-06 ENCOUNTER — Other Ambulatory Visit: Payer: Self-pay

## 2016-06-06 DIAGNOSIS — Z862 Personal history of diseases of the blood and blood-forming organs and certain disorders involving the immune mechanism: Secondary | ICD-10-CM

## 2016-06-06 DIAGNOSIS — Z79899 Other long term (current) drug therapy: Secondary | ICD-10-CM

## 2016-06-06 DIAGNOSIS — E039 Hypothyroidism, unspecified: Secondary | ICD-10-CM

## 2016-06-07 LAB — CBC WITH DIFFERENTIAL/PLATELET
BASOS ABS: 56 {cells}/uL (ref 0–200)
Basophils Relative: 1 %
EOS ABS: 112 {cells}/uL (ref 15–500)
Eosinophils Relative: 2 %
HCT: 37 % (ref 35.0–45.0)
HEMOGLOBIN: 12.3 g/dL (ref 11.7–15.5)
LYMPHS ABS: 1232 {cells}/uL (ref 850–3900)
LYMPHS PCT: 22 %
MCH: 28.7 pg (ref 27.0–33.0)
MCHC: 33.2 g/dL (ref 32.0–36.0)
MCV: 86.2 fL (ref 80.0–100.0)
MONO ABS: 504 {cells}/uL (ref 200–950)
MPV: 12.4 fL (ref 7.5–12.5)
Monocytes Relative: 9 %
NEUTROS PCT: 66 %
Neutro Abs: 3696 cells/uL (ref 1500–7800)
Platelets: 229 10*3/uL (ref 140–400)
RBC: 4.29 MIL/uL (ref 3.80–5.10)
RDW: 13.2 % (ref 11.0–15.0)
WBC: 5.6 10*3/uL (ref 3.8–10.8)

## 2016-06-08 ENCOUNTER — Other Ambulatory Visit: Payer: Self-pay

## 2016-06-08 DIAGNOSIS — E039 Hypothyroidism, unspecified: Secondary | ICD-10-CM

## 2016-06-08 DIAGNOSIS — Z13220 Encounter for screening for lipoid disorders: Secondary | ICD-10-CM

## 2016-06-08 LAB — COMPLETE METABOLIC PANEL WITH GFR
ALBUMIN: 4.4 g/dL (ref 3.6–5.1)
ALT: 11 U/L (ref 6–29)
AST: 14 U/L (ref 10–30)
Alkaline Phosphatase: 31 U/L — ABNORMAL LOW (ref 33–115)
BILIRUBIN TOTAL: 0.6 mg/dL (ref 0.2–1.2)
BUN: 10 mg/dL (ref 7–25)
CALCIUM: 9.1 mg/dL (ref 8.6–10.2)
CHLORIDE: 105 mmol/L (ref 98–110)
CO2: 28 mmol/L (ref 20–31)
CREATININE: 0.82 mg/dL (ref 0.50–1.10)
GFR, Est African American: >89 mL/min (ref >=60)
GLUCOSE: 85 mg/dL (ref 65–99)
POTASSIUM: 4.2 mmol/L (ref 3.5–5.3)
Sodium: 140 mmol/L (ref 135–146)
Total Protein: 6.3 g/dL (ref 6.1–8.1)

## 2016-06-08 LAB — TSH: TSH: 24.91 mIU/L — ABNORMAL HIGH

## 2016-06-08 MED ORDER — LEVOTHYROXINE SODIUM 150 MCG PO TABS: 150.0000 ug | ORAL_TABLET | Freq: Every day | ORAL | Status: DC

## 2016-06-08 NOTE — Progress Notes (Unsigned)
Ok sent higher dose, wrote note to pharmacy to fill Unithroid only.

## 2016-06-09 LAB — THYROID PEROXIDASE ANTIBODY: Thyroperoxidase Ab SerPl-aCnc: >900 IU/mL — ABNORMAL HIGH (ref <9)

## 2016-06-09 LAB — LIPID PANEL
CHOL/HDL RATIO: 3.1 ratio (ref <=5.0)
CHOLESTEROL: 161 mg/dL (ref 125–200)
HDL: 52 mg/dL (ref >=46)
LDL Cholesterol: 95 mg/dL (ref <130)
TRIGLYCERIDES: 71 mg/dL (ref <150)
VLDL: 14 mg/dL (ref <30)

## 2016-06-09 LAB — THYROGLOBULIN LEVEL: Thyroglobulin: 71.7 ng/mL — ABNORMAL HIGH (ref 2.8–40.9)

## 2016-06-09 LAB — T3: T3, Total: 87 ng/dL (ref 76–181)

## 2016-06-09 LAB — T4, FREE: Free T4: 1.5 ng/dL (ref 0.8–1.8)

## 2016-07-03 ENCOUNTER — Encounter: Payer: Self-pay | Admitting: Emergency Medicine

## 2016-07-03 ENCOUNTER — Emergency Department
Admission: EM | Admit: 2016-07-03 | Discharge: 2016-07-03 | Disposition: A | Discharge status: Home / Self Care | Payer: 59 | Source: Home / Self Care | Attending: Family Medicine | Admitting: Family Medicine

## 2016-07-03 DIAGNOSIS — H6502 Acute serous otitis media, left ear: Secondary | ICD-10-CM

## 2016-07-03 DIAGNOSIS — J069 Acute upper respiratory infection, unspecified: Secondary | ICD-10-CM | POA: Diagnosis not present

## 2016-07-03 DIAGNOSIS — B9789 Other viral agents as the cause of diseases classified elsewhere: Principal | ICD-10-CM

## 2016-07-03 MED ORDER — AZITHROMYCIN 250 MG PO TABS: ORAL_TABLET | ORAL | 0 refills | Status: DC

## 2016-07-03 MED ORDER — PREDNISONE 20 MG PO TABS: ORAL_TABLET | ORAL | 0 refills | Status: DC

## 2016-07-03 MED ORDER — BENZONATATE 200 MG PO CAPS: 200.0000 mg | ORAL_CAPSULE | Freq: Every day | ORAL | 0 refills | Status: DC

## 2016-07-03 NOTE — Discharge Instructions (Signed)
Take plain guaifenesin (1200mg extended release tabs such as Mucinex) twice daily, with plenty of water, for cough and congestion.  May add Pseudoephedrine (30mg, one or two every 4 to 6 hours) for sinus congestion.  Get adequate rest.   °May use Afrin nasal spray (or generic oxymetazoline) twice daily for about 5 days and then discontinue.  Also recommend using saline nasal spray several times daily and saline nasal irrigation (AYR is a common brand).   °Try warm salt water gargles for sore throat.  °Stop all antihistamines for now, and other non-prescription cough/cold preparations. °Follow-up with family doctor if not improving about10 days.  °

## 2016-07-03 NOTE — ED Triage Notes (Signed)
Patient reports 5 day history of croup-like cough and left ear pain. Took ibuprofen 600mg  one hour ago.

## 2016-07-03 NOTE — ED Provider Notes (Signed)
[name]    CSN: [id number] Arrival date & time: 07/03/16  1508         History   Chief Complaint Chief Complaint  Patient presents with  . Croup  . Otalgia    HPI Emily [name] is a 35 y.o. female.   Patient complains of five day history of typical cold-like symptoms developing over several days, including mild sore throat, sinus congestion, fatigue, chills, and cough.  Last night she developed left earache and decreased hearing.  She has a past history of frequent otitis media.  She feels tight in her anterior chest.   She states that her colds always linger, and she has a past history of pneumonia.  Family history of asthma in her father.      Past Medical History:  Diagnosis Date  . Anemia    history  . Bartholin cyst 12/27/2011  . Celiac disease   . Collagenous colitis   . Delivery of twins, both live 12/02/2012  . Dichorionic diamniotic twin pregnancy 12/01/2012  . Headache(784.0)    complex migraines with cardiac and neuro symptoms during pregnancy  . Hypothyroidism   . Infertility, female   . SVD (spontaneous vaginal delivery) 12/02/2012  . Thyroid disease     Patient Active Problem List   Diagnosis Date Noted  . History of colonoscopy 03/31/2016  . Cervical cancer screening 03/31/2016  . History of celiac disease 03/31/2016  . Thyroid activity decreased 03/31/2016  . Anxiety and depression 03/31/2016  . History of anemia 03/31/2016  . Lipid screening 03/31/2016  . Medication management 03/31/2016  . Encounter for contraceptive management 03/31/2016  . Tachycardia 11/13/2012  . Hypokalemia 11/13/2012  . Disturbance of skin sensation 10/07/2012  . Complicated migraine 10/07/2012  . Bartholin cyst 12/27/2011    Past Surgical History:  Procedure Laterality Date  . BARTHOLIN CYST MARSUPIALIZATION  12/28/2011   Procedure: BARTHOLIN CYST MARSUPIALIZATION;  Surgeon: Sherron Monday, MD;  Location: [facility];  Service: Gynecology;   Laterality: N/A;  . COLONOSCOPY    . RHINOPLASTY    . TONSILLECTOMY    . UPPER GASTROINTESTINAL ENDOSCOPY    . WISDOM TOOTH EXTRACTION      OB History    Gravida Para Term Preterm AB Living   3 2 2     3    SAB TAB Ectopic Multiple Live Births         1         Home Medications    Prior to Admission medications   Medication Sig Start Date End Date Taking? Authorizing Provider  ALPRAZolam Prudy Feeler) 0.25 MG tablet Take 0.25 mg by mouth daily as needed for anxiety (use sparingly to avoid dependence).    Historical Provider, MD  azithromycin (ZITHROMAX Z-PAK) 250 MG tablet Take 2 tabs today; then begin one tab once daily for 4 more days. 07/03/16   Lattie Haw, MD  benzonatate (TESSALON) 200 MG capsule Take 1 capsule (200 mg total) by mouth at bedtime. Take as needed for cough 07/03/16   Lattie Haw, MD  Budesonide 9 MG TB24 Take 1 tablet by mouth daily.    Historical Provider, MD  clobetasol cream (TEMOVATE) 0.05 % Apply 1 application topically 2 (two) times daily. Twice daily for severe rash then decrease to twice weekly. Use only as needed for severe rash to avoid thinning and whitening of skin. 05/12/16   Sunnie Nielsen, DO  ibuprofen (ADVIL,MOTRIN) 800 MG tablet Take 1 tablet (800 mg total) by mouth  every 8 (eight) hours as needed. 01/09/16   Jody Bovard-Stuckert, MD  KETOCONAZOLE, TOPICAL, 1 % SHAM Use twice per week for scalp dermatitis 05/12/16   Sunnie Nielsen, DO  levothyroxine (SYNTHROID, LEVOTHROID) 150 MCG tablet Take 1 tablet (150 mcg total) by mouth daily before breakfast. Take 30 minutes before eating, do not take with other medicines. 06/08/16   Sunnie Nielsen, DO  predniSONE (DELTASONE) 20 MG tablet Take one tab by mouth twice daily for 5 days, then one daily for 3 days. Take with food. 07/03/16   Lattie Haw, MD  sertraline (ZOLOFT) 25 MG tablet Take 25 mg by mouth daily.     Historical Provider, MD    Family History Family History  Problem Relation Age of  Onset  . Heart attack Maternal Grandmother   . Rashes / Skin problems Paternal Grandmother   . Clotting disorder Paternal Uncle     Social History Social History  Substance Use Topics  . Smoking status: Never Smoker  . Smokeless tobacco: Never Used  . Alcohol use No     Comment: socially when not pregnant     Allergies   Rifaximin; Gluten meal; Augmentin [amoxicillin-pot clavulanate]; Xifaxan [rifaximin]; and Levothyroxine sodium   Review of Systems Review of Systems + sore throat + hoarse + cough No pleuritic pain No wheezing + nasal congestion + post-nasal drainage No sinus pain/pressure No itchy/red eyes + left earache No hemoptysis ? SOB No fever, + chills No nausea No vomiting No abdominal pain No diarrhea No urinary symptoms No skin rash + fatigue + myalgias No headache Used OTC meds without relief   Physical Exam Triage Vital Signs ED Triage Vitals  Enc Vitals Group     BP 07/03/16 1545 102/69     Pulse Rate 07/03/16 1545 82     Resp 07/03/16 1545 16     Temp 07/03/16 1545 98.3 F (36.8 C)     Temp Source 07/03/16 1545 Oral     SpO2 07/03/16 1545 100 %     Weight 07/03/16 1545 105 lb (47.6 kg)     Height 07/03/16 1545 5\' 6"  (1.676 m)     Head Circumference --      Peak Flow --      Pain Score 07/03/16 1547 2     Pain Loc --      Pain Edu? --      Excl. in GC? --    No data found.   Updated Vital Signs BP 102/69 (BP Location: Left Arm)   Pulse 82   Temp 98.3 F (36.8 C) (Oral)   Resp 16   Ht 5\' 6"  (1.676 m)   Wt 105 lb (47.6 kg)   LMP 06/10/2016 (Exact Date)   SpO2 100%   BMI 16.95 kg/m        Physical Exam Nursing notes and Vital Signs reviewed. Appearance:  Patient appears stated age, and in no acute distress Eyes:  Pupils are equal, round, and reactive to light and accomodation.  Extraocular movement is intact.  Conjunctivae are not inflamed  Ears:  Canals normal.  Right tympanic membrane normal; left tympanic membrane  slightly erythematous with serous effusion present.  Nose:  Congested turbinates.  No sinus tenderness.   Pharynx:  Normal Neck:  Supple.  Tender enlarged posterior/lateral nodes are palpated bilaterally  Lungs:  Clear to auscultation.  Breath sounds are equal.  Moving air well. Chest:  Distinct tenderness to palpation over the mid-sternum.  Heart:  Regular  rate and rhythm without murmurs, rubs, or gallops.  Abdomen:  Nontender without masses or hepatosplenomegaly.  Bowel sounds are present.  No CVA or flank tenderness.  Extremities:  No edema.  Skin:  No rash present.    UC Treatments / Results  Labs (all labs ordered are listed, but only abnormal results are displayed) Labs Reviewed - No data to display        Procedures none     Initial Impression / Assessment and Plan / UC Course  I have reviewed the triage vital signs and the nursing notes.  Pertinent labs & imaging results that were available during my care of the patient were reviewed by me and considered in my medical decision making (see chart for details).  Clinical Course       Final Clinical Impressions(s) / UC Diagnoses   Final diagnoses:  Viral URI with cough  Acute serous otitis media of left ear, recurrence not specified    New Prescriptions Discharge Medication List as of 07/03/2016  4:33 PM    START taking these medications   Details  azithromycin (ZITHROMAX Z-PAK) 250 MG tablet Take 2 tabs today; then begin one tab once daily for 4 more days., Print    benzonatate (TESSALON) 200 MG capsule Take 1 capsule (200 mg total) by mouth at bedtime. Take as needed for cough, Starting Sun 07/03/2016, Print    predniSONE (DELTASONE) 20 MG tablet Take one tab by mouth twice daily for 5 days, then one daily for 3 days. Take with food., Print       Because of patient's personal history of prolonged viral upper respiratory infections, and family history of asthma, will begin prednisone burst/taper.  Take plain  guaifenesin (  extended release tabs such as Mucinex) twice daily, with plenty of water, for cough and congestion.  May add Pseudoephedrine ( , one or two every 4 to 6 hours) for sinus congestion.  Get adequate rest.   May use Afrin nasal spray (or generic oxymetazoline) twice daily for about 5 days and then discontinue.  Also recommend using saline nasal spray several times daily and saline nasal irrigation (AYR is a common brand).    Try warm salt water gargles for sore throat.  Stop all antihistamines for now, and other non-prescription cough/cold preparations. Followup with Family Doctor if not improved in one week.   Follow-up with family doctor if not improving about 10 days.    Lattie Haw, MD 07/03/16 [phone]

## 2016-07-05 ENCOUNTER — Telehealth: Payer: Self-pay

## 2016-07-05 NOTE — Telephone Encounter (Signed)
Emily [name] was seen over the weekend by Dr Cathren Harsh at the Urgent Care. She was treated for an URI with prednisone 20 mg bid, tessalon 200 mg qhs prn and azithromycin. She was also advised to drink plenty of water, take guaifenesin, pseudoephedrine and afrin nasal spray. She states she is not much better today. She complains her throat feels swollen and she is having a hard time breathing due to the swelling. She wanted to come in to see Dr Lyn Hollingshead today but there are no openings. Is there something she can take for the swelling?

## 2016-07-05 NOTE — Telephone Encounter (Signed)
She can try chloraseptic spray or lozenges with menthol or benzocaine for throat pain. As far as swelling, this isn't something I could really evaluate over the phone and true swelling in the throat is more concerning for other infection or possible thickened allergy reaction and she should be evaluated for this in office. If all she is having his pain, see above for instructions.

## 2016-07-06 NOTE — Telephone Encounter (Signed)
Patient advised of recommendations. She states she wanted to be seen yesterday. She is upset because she wasn't seen yesterday.

## 2016-07-06 NOTE — Telephone Encounter (Signed)
Left a message for patient to call back. 

## 2016-07-26 DIAGNOSIS — F341 Dysthymic disorder: Secondary | ICD-10-CM | POA: Diagnosis not present

## 2016-07-26 DIAGNOSIS — F332 Major depressive disorder, recurrent severe without psychotic features: Secondary | ICD-10-CM | POA: Diagnosis not present

## 2016-07-26 DIAGNOSIS — F411 Generalized anxiety disorder: Secondary | ICD-10-CM | POA: Diagnosis not present

## 2016-08-31 DIAGNOSIS — F341 Dysthymic disorder: Secondary | ICD-10-CM | POA: Diagnosis not present

## 2016-08-31 DIAGNOSIS — F411 Generalized anxiety disorder: Secondary | ICD-10-CM | POA: Diagnosis not present

## 2016-10-10 DIAGNOSIS — Z23 Encounter for immunization: Secondary | ICD-10-CM | POA: Diagnosis not present

## 2016-10-10 DIAGNOSIS — H538 Other visual disturbances: Secondary | ICD-10-CM | POA: Diagnosis not present

## 2016-10-10 DIAGNOSIS — G44211 Episodic tension-type headache, intractable: Secondary | ICD-10-CM | POA: Diagnosis not present

## 2016-10-10 DIAGNOSIS — G43119 Migraine with aura, intractable, without status migrainosus: Secondary | ICD-10-CM | POA: Diagnosis not present

## 2016-10-10 DIAGNOSIS — G43009 Migraine without aura, not intractable, without status migrainosus: Secondary | ICD-10-CM | POA: Diagnosis not present

## 2016-10-23 ENCOUNTER — Other Ambulatory Visit: Payer: Self-pay | Admitting: Osteopathic Medicine

## 2016-12-01 ENCOUNTER — Other Ambulatory Visit: Payer: Self-pay | Admitting: Neurology

## 2016-12-01 DIAGNOSIS — R519 Headache, unspecified: Secondary | ICD-10-CM

## 2016-12-01 DIAGNOSIS — R51 Headache: Secondary | ICD-10-CM

## 2016-12-01 DIAGNOSIS — H539 Unspecified visual disturbance: Secondary | ICD-10-CM

## 2016-12-07 ENCOUNTER — Other Ambulatory Visit: Payer: Self-pay | Admitting: Osteopathic Medicine

## 2016-12-07 ENCOUNTER — Ambulatory Visit (INDEPENDENT_AMBULATORY_CARE_PROVIDER_SITE_OTHER): Payer: BLUE CROSS/BLUE SHIELD | Admitting: Physician Assistant

## 2016-12-07 ENCOUNTER — Encounter: Payer: Self-pay | Admitting: Physician Assistant

## 2016-12-07 VITALS — BP 108/72 | HR 108 | Resp 16 | Wt 98.8 lb

## 2016-12-07 DIAGNOSIS — J019 Acute sinusitis, unspecified: Secondary | ICD-10-CM | POA: Diagnosis not present

## 2016-12-07 DIAGNOSIS — E039 Hypothyroidism, unspecified: Secondary | ICD-10-CM | POA: Diagnosis not present

## 2016-12-07 MED ORDER — AZITHROMYCIN 250 MG PO TABS: ORAL_TABLET | ORAL | 0 refills | Status: DC

## 2016-12-07 NOTE — Patient Instructions (Signed)
Recommend OTC saline nasal spray to prevent dryness/nosebleeds  Sinusitis, Adult Sinusitis is soreness and inflammation of your sinuses. Sinuses are hollow spaces in the bones around your face. Your sinuses are located:  Around your eyes.  In the middle of your forehead.  Behind your nose.  In your cheekbones. Your sinuses and nasal passages are lined with a stringy fluid (mucus). Mucus normally drains out of your sinuses. When your nasal tissues become inflamed or swollen, the mucus can become trapped or blocked so air cannot flow through your sinuses. This allows bacteria, viruses, and funguses to grow, which leads to infection. Sinusitis can develop quickly and last for 7?10 days (acute) or for more than 12 weeks (chronic). Sinusitis often develops after a cold. What are the causes? This condition is caused by anything that creates swelling in the sinuses or stops mucus from draining, including:  Allergies.  Asthma.  Bacterial or viral infection.  Abnormally shaped bones between the nasal passages.  Nasal growths that contain mucus (nasal polyps).  Narrow sinus openings.  Pollutants, such as chemicals or irritants in the air.  A foreign object stuck in the nose.  A fungal infection. This is rare. What increases the risk? The following factors may make you more likely to develop this condition:  Having allergies or asthma.  Having had a recent cold or respiratory tract infection.  Having structural deformities or blockages in your nose or sinuses.  Having a weak immune system.  Doing a lot of swimming or diving.  Overusing nasal sprays.  Smoking. What are the signs or symptoms? The main symptoms of this condition are pain and a feeling of pressure around the affected sinuses. Other symptoms include:  Upper toothache.  Earache.  Headache.  Bad breath.  Decreased sense of smell and taste.  A cough that may get worse at  night.  Fatigue.  Fever.  Thick drainage from your nose. The drainage is often green and it may contain pus (purulent).  Stuffy nose or congestion.  Postnasal drip. This is when extra mucus collects in the throat or back of the nose.  Swelling and warmth over the affected sinuses.  Sore throat.  Sensitivity to light. How is this diagnosed? This condition is diagnosed based on symptoms, a medical history, and a physical exam. To find out if your condition is acute or chronic, your health care provider may:  Look in your nose for signs of nasal polyps.  Tap over the affected sinus to check for signs of infection.  View the inside of your sinuses using an imaging device that has a light attached (endoscope). If your health care provider suspects that you have chronic sinusitis, you may also:  Be tested for allergies.  Have a sample of mucus taken from your nose (nasal culture) and checked for bacteria.  Have a mucus sample examined to see if your sinusitis is related to an allergy. If your sinusitis does not respond to treatment and it lasts longer than 8 weeks, you may have an MRI or CT scan to check your sinuses. These scans also help to determine how severe your infection is. In rare cases, a bone biopsy may be done to rule out more serious types of fungal sinus disease. How is this treated? Treatment for sinusitis depends on the cause and whether your condition is chronic or acute. If a virus is causing your sinusitis, your symptoms will go away on their own within 10 days. You may be given medicines to  relieve your symptoms, including:  Topical nasal decongestants. They shrink swollen nasal passages and let mucus drain from your sinuses.  Antihistamines. These drugs block inflammation that is triggered by allergies. This can help to ease swelling in your nose and sinuses.  Topical nasal corticosteroids. These are nasal sprays that ease inflammation and swelling in your nose  and sinuses.  Nasal saline washes. These rinses can help to get rid of thick mucus in your nose. If your condition is caused by bacteria, you will be given an antibiotic medicine. If your condition is caused by a fungus, you will be given an antifungal medicine. Surgery may be needed to correct underlying conditions, such as narrow nasal passages. Surgery may also be needed to remove polyps. Follow these instructions at home: Medicines  Take, use, or apply over-the-counter and prescription medicines only as told by your health care provider. These may include nasal sprays.  If you were prescribed an antibiotic medicine, take it as told by your health care provider. Do not stop taking the antibiotic even if you start to feel better. Hydrate and Humidify  Drink enough water to keep your urine clear or pale yellow. Staying hydrated will help to thin your mucus.  Use a cool mist humidifier to keep the humidity level in your home above 50%.  Inhale steam for 10-15 minutes, 3-4 times a day or as told by your health care provider. You can do this in the bathroom while a hot shower is running.  Limit your exposure to cool or dry air. Rest  Rest as much as possible.  Sleep with your head raised (elevated).  Make sure to get enough sleep each night. General instructions  Apply a warm, moist washcloth to your face 3-4 times a day or as told by your health care provider. This will help with discomfort.  Wash your hands often with soap and water to reduce your exposure to viruses and other germs. If soap and water are not available, use hand sanitizer.  Do not smoke. Avoid being around people who are smoking (secondhand smoke).  Keep all follow-up visits as told by your health care provider. This is important. Contact a health care provider if:  You have a fever.  Your symptoms get worse.  Your symptoms do not improve within 10 days. Get help right away if:  You have a severe  headache.  You have persistent vomiting.  You have pain or swelling around your face or eyes.  You have vision problems.  You develop confusion.  Your neck is stiff.  You have trouble breathing. This information is not intended to replace advice given to you by your health care provider. Make sure you discuss any questions you have with your health care provider. Document Released: 11/28/2005 Document Revised: 07/24/2016 Document Reviewed: 09/23/2015 Elsevier Interactive Patient Education  2017 [employer]

## 2016-12-07 NOTE — Progress Notes (Signed)
   Subjective:    Patient ID: Emily [name], female    DOB: 10-09-1981, 35 y.o.   MRN: [id number]  Patient reports upper respiratory symptoms that began approx. 3 weeks ago with sore throat, congestion, and cough. Other symptoms have resolved, but sinus symptoms have worsened. She has a history of acute sinusitis and states these symptoms feel similar.   Sinusitis  This is a new problem. Episode onset: 3 weeks. The problem is unchanged. There has been no fever. Associated symptoms include congestion, headaches and sinus pressure. Pertinent negatives include no chills, coughing, ear pain or sore throat. Past treatments include nothing.  Patient has microscopic colitis and history of C. Difficile colitis. Allergic to Augmentin. Has done well with Z-pak in the past.  She is also requesting a TSH check today.  Review of Systems  Constitutional: Negative for appetite change, chills, fatigue and fever.  HENT: Positive for congestion, nosebleeds, sinus pain and sinus pressure. Negative for ear pain, sore throat and trouble swallowing.   Eyes: Negative.   Respiratory: Negative for cough and wheezing.   Cardiovascular: Negative for chest pain.  Gastrointestinal: Positive for diarrhea (chronic).  Genitourinary: Negative.   Musculoskeletal: Negative for myalgias.  Skin: Negative for rash.  Neurological: Positive for headaches.       Objective:   Physical Exam  Constitutional: She is active. She does not appear ill. No distress.  HENT:  Right Ear: Tympanic membrane normal.  Left Ear: Tympanic membrane normal.  Nose: Mucosal edema present. No epistaxis. Right sinus exhibits maxillary sinus tenderness and frontal sinus tenderness. Left sinus exhibits maxillary sinus tenderness and frontal sinus tenderness.  Mouth/Throat: Uvula is midline, oropharynx is clear and moist and mucous membranes are normal.  Eyes: Conjunctivae, EOM and lids are normal.  Neck: Phonation normal. Neck supple.   Cardiovascular: Normal rate, regular rhythm, S1 normal, S2 normal and normal pulses.   Pulmonary/Chest: Effort normal and breath sounds normal. She has no wheezes. She has no rhonchi. She has no rales.  Lymphadenopathy:    She has cervical adenopathy.       Right cervical: Posterior cervical adenopathy present.       Left cervical: Posterior cervical adenopathy present.  Neurological: She is alert.  Skin: Skin is warm and dry. No rash noted. No cyanosis.          Assessment & Plan:  1. Acute non-recurrent sinusitis, unspecified location - azithromycin (ZITHROMAX Z-PAK) 250 MG tablet; Take 2 tablets (500 mg) on  Day 1,  followed by 1 tablet (250 mg) once daily on Days 2 through 5.  Dispense: 6 tablet; Refill: 0 - recommended OTC nasal saline for epistaxis  2. Hypothyroidism, unspecified type - TSH  Patient education and anticipatory guidance given Patient agrees with treatment plan Follow-up as needed or if symptoms worsen or fail to improve  Emily Hubertharley E. Cummings PA-C

## 2016-12-08 ENCOUNTER — Other Ambulatory Visit: Payer: Self-pay

## 2016-12-08 LAB — TSH: TSH: 3.94 mIU/L

## 2016-12-08 MED ORDER — LEVOTHYROXINE SODIUM 150 MCG PO TABS: 150.0000 ug | ORAL_TABLET | Freq: Every day | ORAL | 3 refills | Status: DC

## 2016-12-14 ENCOUNTER — Telehealth: Payer: Self-pay

## 2016-12-14 DIAGNOSIS — R058 Other specified cough: Secondary | ICD-10-CM

## 2016-12-14 DIAGNOSIS — R05 Cough: Secondary | ICD-10-CM

## 2016-12-14 MED ORDER — PREDNISONE 50 MG PO TABS
50.0000 mg | ORAL_TABLET | Freq: Every day | ORAL | 0 refills | Status: DC
Start: 2016-12-14 — End: 2017-05-29

## 2016-12-14 NOTE — Telephone Encounter (Signed)
Patient advised.   FYI  She wants Vinetta BergamoCharley to know she is very thankful.

## 2016-12-14 NOTE — Telephone Encounter (Signed)
Emily [name] was seen last week for a sinus infection. She states she now has a slightly sore throat and a barking cough and reports trouble breathing when she has a coughing episode. Denies fever, chills or sweats. She states these symptoms are a common problem she has and believes it to be the "croup". She has taken prednisone in the past for this problem. Please advise.

## 2016-12-14 NOTE — Telephone Encounter (Signed)
Hi Angela! Let Tahari know that I am happy to send in an Rx for Prednisone. I believe this is a post-viral cough syndrome, unlikely to be croup, but we treat both the same with steroids. If she is not improved after the steroid burst, she should return for a chest xray and a visit. Thanks! Vinetta Bergamoharley

## 2017-04-04 DIAGNOSIS — K52839 Microscopic colitis, unspecified: Secondary | ICD-10-CM | POA: Diagnosis not present

## 2017-04-04 DIAGNOSIS — K900 Celiac disease: Secondary | ICD-10-CM | POA: Diagnosis not present

## 2017-04-29 ENCOUNTER — Other Ambulatory Visit: Payer: Self-pay | Admitting: Osteopathic Medicine

## 2017-05-10 ENCOUNTER — Other Ambulatory Visit: Payer: Self-pay

## 2017-05-10 MED ORDER — LEVOTHYROXINE SODIUM 150 MCG PO TABS: ORAL_TABLET | ORAL | 0 refills | Status: DC

## 2017-05-10 NOTE — Telephone Encounter (Signed)
Patient request refill for Unithyroid 150 mg. Patient advised appointment is needed for further refills. Rhonda Cunningham,CMA

## 2017-05-15 ENCOUNTER — Telehealth: Payer: Self-pay

## 2017-05-15 MED ORDER — LEVOTHYROXINE SODIUM 150 MCG PO TABS: ORAL_TABLET | ORAL | 0 refills | Status: DC

## 2017-05-15 NOTE — Telephone Encounter (Signed)
Patient request refill for Levothryoxine. Patient understood that she must keep appointment for further refills. Patient was give a 30 day supply and transferred up front to schedule. Asuzena Weis,CMA

## 2017-05-18 ENCOUNTER — Ambulatory Visit: Payer: BLUE CROSS/BLUE SHIELD | Admitting: Osteopathic Medicine

## 2017-05-29 ENCOUNTER — Encounter: Payer: Self-pay | Admitting: Osteopathic Medicine

## 2017-05-29 ENCOUNTER — Ambulatory Visit (INDEPENDENT_AMBULATORY_CARE_PROVIDER_SITE_OTHER): Payer: BLUE CROSS/BLUE SHIELD | Admitting: Osteopathic Medicine

## 2017-05-29 VITALS — BP 120/84 | HR 76 | Ht 66.0 in | Wt 103.0 lb

## 2017-05-29 DIAGNOSIS — E441 Mild protein-calorie malnutrition: Secondary | ICD-10-CM | POA: Diagnosis not present

## 2017-05-29 DIAGNOSIS — R5383 Other fatigue: Secondary | ICD-10-CM | POA: Diagnosis not present

## 2017-05-29 DIAGNOSIS — K900 Celiac disease: Secondary | ICD-10-CM | POA: Diagnosis not present

## 2017-05-29 DIAGNOSIS — E039 Hypothyroidism, unspecified: Secondary | ICD-10-CM

## 2017-05-29 LAB — CBC
HCT: 39 % (ref 35.0–45.0)
Hemoglobin: 13.1 g/dL (ref 11.7–15.5)
MCH: 29 pg (ref 27.0–33.0)
MCHC: 33.6 g/dL (ref 32.0–36.0)
MCV: 86.5 fL (ref 80.0–100.0)
MPV: 11.4 fL (ref 7.5–12.5)
PLATELETS: 235 10*3/uL (ref 140–400)
RBC: 4.51 MIL/uL (ref 3.80–5.10)
RDW: 13.2 % (ref 11.0–15.0)
WBC: 8.3 10*3/uL (ref 3.8–10.8)

## 2017-05-29 NOTE — Progress Notes (Signed)
HPI: Emily [name] is a 36 y.o. female  who presents to [place] today, 05/29/17,  for chief complaint of:  Chief Complaint  Patient presents with  . Fatigue    Chronic fatigue w/ no known trigger but worse lately. Difficulty with any motivation. Sleeping too much. Severe limitations to normal activity. No significant mood change but certainly depressed due to fatigue. Concern that history of abnormal migraines +/- celiac disease +/- thyroid problems +/- longstanding fatigue - multifactorial cause for lack of energy, very frustrated.   GI doesn't seem to be helpful - she is worried about nutrient deficiencies but not sure this is being addressed. Has met with dietician in the past. Is following with neurology for MRI later this month for further evaluation atypical migraines.    Past medical history, surgical history, social history and family history reviewed.  Patient Active Problem List   Diagnosis Date Noted  . History of colonoscopy 03/31/2016  . Cervical cancer screening 03/31/2016  . History of celiac disease 03/31/2016  . Thyroid activity decreased 03/31/2016  . Anxiety and depression 03/31/2016  . History of anemia 03/31/2016  . Lipid screening 03/31/2016  . Medication management 03/31/2016  . Encounter for contraceptive management 03/31/2016  . Tachycardia 11/13/2012  . Hypokalemia 11/13/2012  . Disturbance of skin sensation 10/07/2012  . Complicated migraine 81/85/6314  . Bartholin cyst 12/27/2011    Current medication list and allergy/intolerance information reviewed.   Current Outpatient Prescriptions on File Prior to Visit  Medication Sig Dispense Refill  . ALPRAZolam (XANAX) 0.25 MG tablet Take 0.25 mg by mouth daily as needed for anxiety (use sparingly to avoid dependence).    Marland Kitchen azithromycin (ZITHROMAX Z-PAK) 250 MG tablet Take 2 tablets (500 mg) on  Day 1,  followed by 1 tablet (250 mg) once daily on Days 2 through 5. 6  tablet 0  . Budesonide 9 MG TB24 Take 1 tablet by mouth daily.    Marland Kitchen ibuprofen (ADVIL,MOTRIN) 800 MG tablet Take 1 tablet (800 mg total) by mouth every 8 (eight) hours as needed. 45 tablet 1  . levothyroxine (UNITHROID) 150 MCG tablet Take 1 tablet (150 mcg total) by mouth daily before breakfast. 90 tablet 3  . levothyroxine (UNITHROID) 150 MCG tablet TAKE 1 TAB BY MOUTH DAILY BEFORE BREAKFAST. ** APPT NEEDED FOR FUTURE REFILLS** 30 tablet 0   No current facility-administered medications on file prior to visit.    Allergies  Allergen Reactions  . Rifaximin Itching and Other (See Comments)    Reaction:  Itching of throat  . Gluten Meal Diarrhea, Nausea And Vomiting and Other (See Comments)    Pt has celiac disease  . Augmentin [Amoxicillin-Pot Clavulanate] Other (See Comments)    Itching throat - amoxicilin alone doesn't cause any problems   . Xifaxan [Rifaximin]   . Levothyroxine Sodium Diarrhea    Only has GI reaction to synthroid brand      Review of Systems:  Constitutional: No recent illness  HEENT: No  headache, no vision change  Cardiac: No  chest pain, No  pressure, No palpitations  Respiratory:  No  shortness of breath. No  Cough, no snoring or apnea  Gastrointestinal: No  abdominal pain, no change on bowel habits - chronic diarrhea  Musculoskeletal: No new myalgia/arthralgia  Skin: No  Rash  Neurologic: +generalized weakness, +occasional Dizziness  Psychiatric: +concerns with depression, +concerns with anxiety  Exam:  BP 120/84   Pulse 76   Ht '5\' 6"'$  (1.676  m)   Wt 103 lb (46.7 kg)   BMI 16.62 kg/m    Constitutional: VS see above. General Appearance: alert, well-developed, thin but not cachectic  Eyes: Normal lids and conjunctive, non-icteric sclera  Ears, Nose, Mouth, Throat: MMM, Normal external inspection ears/nares/mouth/lips/gums.  Neck: No masses, trachea midline. No thyromegaly or lymphadenopathy  Respiratory: Normal respiratory effort. no  wheeze, no rhonchi, no rales  Cardiovascular: S1/S2 normal, no murmur, no rub/gallop auscultated. RRR.   Abdominal: BS WNLx4, nontender, nondistended  Musculoskeletal: Gait normal. Symmetric and independent movement of all extremities  Neurological: Normal balance/coordination. No tremor.  Skin: warm, dry, intact.   Psychiatric: Normal judgment/insight. Anxious/tearful mood and affect. Oriented x3.      ASSESSMENT/PLAN: Likely multifactorial but significant limitation to QOL is fairly new concern. Panel for nutrient deficiencies as below, await records. Consider inflammatory markers.   Fatigue, unspecified type - Plan: CBC, COMPLETE METABOLIC PANEL WITH GFR, TSH, VITAMIN D 25 Hydroxy (Vit-D Deficiency, Fractures)  Hypothyroidism, unspecified type - Plan: TSH, T4, free  Celiac disease - Plan: Lipid panel, VITAMIN D 25 Hydroxy (Vit-D Deficiency, Fractures), Vitamin B12, Vitamin A, Vitamin E, Copper, serum, Zinc, Folate RBC, Iron and TIBC, Ferritin, Protime-INR, Vitamin B1, Vitamin B6, Magnesium, Selenium serum    Patient Instructions  Plan:  Broad panel of labs for nutrient deficiencies that folks with Celiac are at higher risk for  We are also checking for common problems with blood count, thyroid, electrolyte imbalance   Will get records form GI and Neuro and will see if I can piece anything together  Plan to follow-up with me after neurology MRI's and visits - be sire at your visit that they are aware to send me records   Any questions, please let me know!      Follow-up plan: Return in about 6 weeks (around 07/10/2017) for follow-up fatigue, sooner if needed.  Visit summary with medication list and pertinent instructions was printed for patient to review, alert [place] if any changes needed. All questions at time of visit were answered - patient instructed to contact office with any additional concerns. ER/RTC precautions were reviewed with the patient and understanding  verbalized.   Note: Total time spent 25 minutes, greater than 50% of the visit was spent face-to-face counseling and coordinating care for the following: The primary encounter diagnosis was Fatigue, unspecified type. Diagnoses of Hypothyroidism, unspecified type and Celiac disease were also pertinent to this visit.Marland Kitchen

## 2017-05-29 NOTE — Patient Instructions (Signed)
Plan:  Broad panel of labs for nutrient deficiencies that folks with Celiac are at higher risk for  We are also checking for common problems with blood count, thyroid, electrolyte imbalance   Will get records form GI and Neuro and will see if I can piece anything together  Plan to follow-up with me after neurology MRI's and visits - be sire at your visit that they are aware to send me records   Any questions, please let me know!

## 2017-05-30 ENCOUNTER — Telehealth: Payer: Self-pay | Admitting: Osteopathic Medicine

## 2017-05-30 DIAGNOSIS — R5383 Other fatigue: Secondary | ICD-10-CM | POA: Diagnosis not present

## 2017-05-30 DIAGNOSIS — Z9102 Food additives allergy status: Secondary | ICD-10-CM | POA: Diagnosis not present

## 2017-05-30 DIAGNOSIS — E079 Disorder of thyroid, unspecified: Secondary | ICD-10-CM | POA: Diagnosis not present

## 2017-05-30 DIAGNOSIS — Z883 Allergy status to other anti-infective agents status: Secondary | ICD-10-CM | POA: Diagnosis not present

## 2017-05-30 DIAGNOSIS — G43101 Migraine with aura, not intractable, with status migrainosus: Secondary | ICD-10-CM | POA: Diagnosis not present

## 2017-05-30 DIAGNOSIS — Z79899 Other long term (current) drug therapy: Secondary | ICD-10-CM | POA: Diagnosis not present

## 2017-05-30 DIAGNOSIS — R202 Paresthesia of skin: Secondary | ICD-10-CM | POA: Diagnosis not present

## 2017-05-30 DIAGNOSIS — G43109 Migraine with aura, not intractable, without status migrainosus: Secondary | ICD-10-CM | POA: Diagnosis not present

## 2017-05-30 DIAGNOSIS — R509 Fever, unspecified: Secondary | ICD-10-CM | POA: Diagnosis not present

## 2017-05-30 DIAGNOSIS — Z881 Allergy status to other antibiotic agents status: Secondary | ICD-10-CM | POA: Diagnosis not present

## 2017-05-30 DIAGNOSIS — R51 Headache: Secondary | ICD-10-CM | POA: Diagnosis not present

## 2017-05-30 LAB — COMPLETE METABOLIC PANEL WITH GFR
ALT: 11 U/L (ref 6–29)
AST: 15 U/L (ref 10–30)
Albumin: 4.5 g/dL (ref 3.6–5.1)
Alkaline Phosphatase: 32 U/L — ABNORMAL LOW (ref 33–115)
BUN: 11 mg/dL (ref 7–25)
CALCIUM: 8.8 mg/dL (ref 8.6–10.2)
CHLORIDE: 106 mmol/L (ref 98–110)
CO2: 23 mmol/L (ref 20–31)
CREATININE: 0.72 mg/dL (ref 0.50–1.10)
GFR, Est African American: >89 mL/min (ref >=60)
GFR, Est Non African American: >89 mL/min (ref >=60)
Glucose, Bld: 86 mg/dL (ref 65–99)
POTASSIUM: 3.9 mmol/L (ref 3.5–5.3)
Sodium: 140 mmol/L (ref 135–146)
Total Bilirubin: 0.4 mg/dL (ref 0.2–1.2)
Total Protein: 6.4 g/dL (ref 6.1–8.1)

## 2017-05-30 LAB — LIPID PANEL
CHOL/HDL RATIO: 2.9 ratio (ref <5.0)
CHOLESTEROL: 161 mg/dL (ref <200)
HDL: 55 mg/dL (ref >50)
LDL CALC: 92 mg/dL (ref <100)
Triglycerides: 70 mg/dL (ref <150)
VLDL: 14 mg/dL (ref <30)

## 2017-05-30 LAB — TSH: TSH: 2.84 mIU/L

## 2017-05-30 LAB — COPPER, SERUM

## 2017-05-30 LAB — IRON AND TIBC
%SAT: 8 % — ABNORMAL LOW (ref 11–50)
IRON: 31 ug/dL — AB (ref 40–190)
TIBC: 383 ug/dL (ref 250–450)
UIBC: 352 ug/dL

## 2017-05-30 LAB — PROTIME-INR

## 2017-05-30 LAB — VITAMIN D 25 HYDROXY (VIT D DEFICIENCY, FRACTURES): VIT D 25 HYDROXY: 28 ng/mL — AB (ref 30–100)

## 2017-05-30 LAB — T4, FREE: Free T4: 1.4 ng/dL (ref 0.8–1.8)

## 2017-05-30 LAB — MAGNESIUM: MAGNESIUM: 2 mg/dL (ref 1.5–2.5)

## 2017-05-30 LAB — FERRITIN: Ferritin: 6 ng/mL — ABNORMAL LOW (ref 10–154)

## 2017-05-30 LAB — FOLATE RBC

## 2017-05-30 LAB — VITAMIN A

## 2017-05-30 LAB — VITAMIN B12: Vitamin B-12: 347 pg/mL (ref 200–1100)

## 2017-05-30 NOTE — Telephone Encounter (Signed)
FYI - [facility] notified office that the pt refused draw for the vitamin labs and the copper/zinc/selenium.

## 2017-05-30 NOTE — Telephone Encounter (Signed)
OK. Patient was instructed at her recent visit that everything I'm getting is possible nutrient deficiencies in patients with celiac. Declining blood draw is her prerogative. Will discuss this further at her next visit

## 2017-05-31 ENCOUNTER — Telehealth: Payer: Self-pay

## 2017-05-31 LAB — VITAMIN B6

## 2017-05-31 LAB — VITAMIN B1

## 2017-05-31 LAB — SELENIUM SERUM

## 2017-05-31 MED ORDER — FERROUS SULFATE 325 (65 FE) MG PO TBEC: 325.0000 mg | DELAYED_RELEASE_TABLET | Freq: Three times a day (TID) | ORAL | 1 refills | Status: DC

## 2017-05-31 NOTE — Addendum Note (Signed)
Addended by: Deirdre PippinsALEXANDER, Chesley Veasey M on: 05/31/2017 02:31 PM   Modules accepted: Orders

## 2017-05-31 NOTE — Telephone Encounter (Signed)
[facility] and stated that patient came down stairs today to get the rest of her labs drawn. Since the orders was canceled, they need to be reordered with correct diagnosis on them. The original orders are not showing up so therefore I am not able to order them, again please order with diagnosis code. Rhonda Cunningham,CMA

## 2017-06-01 NOTE — Addendum Note (Signed)
Addended by: Pixie CasinoUNNINGHAM, Christal Lagerstrom C on: 06/01/2017 03:05 PM   Modules accepted: Orders

## 2017-06-02 NOTE — Telephone Encounter (Signed)
Orders that are noted below were placed. Dezirae Service,CMA

## 2017-06-05 DIAGNOSIS — K900 Celiac disease: Secondary | ICD-10-CM | POA: Diagnosis not present

## 2017-06-05 DIAGNOSIS — E441 Mild protein-calorie malnutrition: Secondary | ICD-10-CM | POA: Diagnosis not present

## 2017-06-06 DIAGNOSIS — Z7952 Long term (current) use of systemic steroids: Secondary | ICD-10-CM | POA: Diagnosis not present

## 2017-06-06 LAB — PROTIME-INR
INR: 1.1
Prothrombin Time: 11.2 s (ref 9.0–11.5)

## 2017-06-06 LAB — FOLATE RBC: RBC Folate: 482 ng/mL (ref >280)

## 2017-06-07 LAB — SELENIUM SERUM: Selenium, Blood: 102 mcg/L (ref 63–160)

## 2017-06-08 LAB — VITAMIN B6: Vitamin B6: 28.5 ng/mL — ABNORMAL HIGH (ref 2.1–21.7)

## 2017-06-08 LAB — ZINC: Zinc: 61 ug/dL (ref 60–130)

## 2017-06-08 LAB — COPPER, SERUM: COPPER: 87 ug/dL (ref 70–175)

## 2017-06-09 LAB — VITAMIN B1: Vitamin B1 (Thiamine): 16 nmol/L (ref 8–30)

## 2017-06-11 LAB — VITAMIN A: VITAMIN A (RETINOIC ACID): 34 ug/dL — AB (ref 38–98)

## 2017-06-12 ENCOUNTER — Telehealth: Payer: Self-pay | Admitting: *Deleted

## 2017-06-12 LAB — VITAMIN E

## 2017-06-12 LAB — ZINC

## 2017-06-12 NOTE — Telephone Encounter (Signed)
Received call from [facility] stating that Vitamin E was not collected due to quantity not sufficient. This is an Financial plannerYI.Emily PacasBarkley, Emily [name]

## 2017-06-15 ENCOUNTER — Ambulatory Visit: Payer: BLUE CROSS/BLUE SHIELD | Admitting: Osteopathic Medicine

## 2017-06-15 DIAGNOSIS — G43109 Migraine with aura, not intractable, without status migrainosus: Secondary | ICD-10-CM | POA: Diagnosis not present

## 2017-06-15 DIAGNOSIS — G44211 Episodic tension-type headache, intractable: Secondary | ICD-10-CM | POA: Diagnosis not present

## 2017-06-15 DIAGNOSIS — H538 Other visual disturbances: Secondary | ICD-10-CM | POA: Diagnosis not present

## 2017-06-15 DIAGNOSIS — G43009 Migraine without aura, not intractable, without status migrainosus: Secondary | ICD-10-CM | POA: Diagnosis not present

## 2017-06-16 ENCOUNTER — Other Ambulatory Visit: Payer: Self-pay | Admitting: Osteopathic Medicine

## 2017-06-20 ENCOUNTER — Other Ambulatory Visit: Payer: Self-pay | Admitting: Neurology

## 2017-06-20 DIAGNOSIS — I671 Cerebral aneurysm, nonruptured: Secondary | ICD-10-CM

## 2017-06-20 DIAGNOSIS — H538 Other visual disturbances: Secondary | ICD-10-CM

## 2017-07-02 ENCOUNTER — Ambulatory Visit
Admission: RE | Admit: 2017-07-02 | Discharge: 2017-07-02 | Disposition: A | Discharge status: Home / Self Care | Payer: BLUE CROSS/BLUE SHIELD | Source: Ambulatory Visit | Attending: Neurology | Admitting: Neurology

## 2017-07-02 DIAGNOSIS — I671 Cerebral aneurysm, nonruptured: Secondary | ICD-10-CM

## 2017-07-02 DIAGNOSIS — G43911 Migraine, unspecified, intractable, with status migrainosus: Secondary | ICD-10-CM | POA: Diagnosis not present

## 2017-07-02 DIAGNOSIS — H538 Other visual disturbances: Secondary | ICD-10-CM

## 2017-07-03 ENCOUNTER — Other Ambulatory Visit: Payer: Self-pay | Admitting: Neurology

## 2017-07-03 ENCOUNTER — Ambulatory Visit
Admission: RE | Admit: 2017-07-03 | Discharge: 2017-07-03 | Disposition: A | Discharge status: Home / Self Care | Payer: Self-pay | Source: Ambulatory Visit | Attending: Neurology | Admitting: Neurology

## 2017-07-03 DIAGNOSIS — I671 Cerebral aneurysm, nonruptured: Secondary | ICD-10-CM

## 2017-07-03 MED ORDER — GADOBENATE DIMEGLUMINE 529 MG/ML IV SOLN
10.0000 mL | Freq: Once | INTRAVENOUS | Status: AC | PRN
  Administered 2017-07-03: 10 mL via INTRAVENOUS

## 2017-10-16 ENCOUNTER — Encounter: Payer: Self-pay | Admitting: Physician Assistant

## 2017-10-16 ENCOUNTER — Ambulatory Visit: Payer: BLUE CROSS/BLUE SHIELD | Admitting: Physician Assistant

## 2017-10-16 VITALS — BP 106/72 | HR 82 | Temp 98.5°F | Wt 100.0 lb

## 2017-10-16 DIAGNOSIS — R21 Rash and other nonspecific skin eruption: Secondary | ICD-10-CM

## 2017-10-16 DIAGNOSIS — J0100 Acute maxillary sinusitis, unspecified: Secondary | ICD-10-CM | POA: Diagnosis not present

## 2017-10-16 MED ORDER — DOXYCYCLINE HYCLATE 100 MG PO TABS: 100.0000 mg | ORAL_TABLET | Freq: Two times a day (BID) | ORAL | 0 refills | Status: DC

## 2017-10-16 MED ORDER — BETAMETHASONE DIPROPIONATE 0.05 % EX CREA: TOPICAL_CREAM | CUTANEOUS | 0 refills | Status: DC

## 2017-10-16 MED ORDER — AMOXICILLIN 400 MG/5ML PO SUSR: 500.0000 mg | Freq: Three times a day (TID) | ORAL | 0 refills | Status: AC

## 2017-10-16 NOTE — Progress Notes (Signed)
HPI:                                                                Emily [name] is a 36 y.o. female who presents to [facility]: [facility] Care [facility] today for sinus congestion and pressure  Sinusitis  This is a new problem. The current episode started 1 to 4 weeks ago (x 2 weeks). The problem is unchanged. There has been no fever. The pain is mild. Associated symptoms include congestion, headaches and sinus pressure. Pertinent negatives include no coughing or neck pain. Treatments tried: OTC cold medicines. The treatment provided no relief.     Past Medical History:  Diagnosis Date  . Anemia    history  . Bartholin cyst 12/27/2011  . Celiac disease   . Collagenous colitis   . Delivery of twins, both live 12/02/2012  . Dichorionic diamniotic twin pregnancy 12/01/2012  . Headache(784.0)    complex migraines with cardiac and neuro symptoms during pregnancy  . Hypothyroidism   . Infertility, female   . SVD (spontaneous vaginal delivery) 12/02/2012  . Thyroid disease    Past Surgical History:  Procedure Laterality Date  . COLONOSCOPY    . RHINOPLASTY    . TONSILLECTOMY    . UPPER GASTROINTESTINAL ENDOSCOPY    . WISDOM TOOTH EXTRACTION     Social History   Tobacco Use  . Smoking status: Never Smoker  . Smokeless tobacco: Never Used  Substance Use Topics  . Alcohol use: No    Comment: socially when not pregnant   family history includes Clotting disorder in her paternal uncle; Heart attack in her maternal grandmother; Rashes / Skin problems in her paternal grandmother.  ROS: negative except as noted in the HPI  Medications: Current Outpatient Medications  Medication Sig Dispense Refill  . ALPRAZolam (XANAX) 0.25 MG tablet Take 0.25 mg by mouth daily as needed for anxiety (use sparingly to avoid dependence).    . ferrous sulfate 325 (65 FE) MG EC tablet Take 1 tablet (325 mg total) by mouth 3 (three) times daily with meals. 90 tablet 1  .  ibuprofen (ADVIL,MOTRIN) 800 MG tablet Take 1 tablet (800 mg total) by mouth every 8 (eight) hours as needed. 45 tablet 1  . levothyroxine (UNITHROID) 150 MCG tablet Take 1 tablet (150 mcg total) by mouth daily before breakfast. 90 tablet 3  . ondansetron (ZOFRAN) 4 MG tablet Take 4 mg by mouth as needed.    Marland Kitchen amoxicillin (AMOXIL) 400 MG/5ML suspension Take 6.3 mLs (500 mg total) 3 (three) times daily for 7 days by mouth. 150 mL 0  . betamethasone dipropionate (DIPROLENE) 0.05 % cream betamethasone dipropionate 0.05 % topical cream  APPLY A THIN LAYER TO THE AFFECTED AREA(S) BY TOPICAL ROUTE ONCE DAILY 30 g 0  . Budesonide ER 9 MG TB24 Take by mouth.    . diphenoxylate-atropine (LOMOTIL) 2.5-0.025 MG tablet Take daily as needed by mouth.     No current facility-administered medications for this visit.    Allergies  Allergen Reactions  . Rifaximin Itching and Other (See Comments)    Reaction:  Itching of throat  . Gluten Meal Diarrhea, Nausea And Vomiting and Other (See Comments)    Pt has celiac disease  . Augmentin [Amoxicillin-Pot  Clavulanate] Other (See Comments)    Itching throat - amoxicilin alone doesn't cause any problems   . Xifaxan [Rifaximin]   . Levothyroxine Sodium Diarrhea    Only has GI reaction to synthroid brand       Objective:  BP 106/72   Pulse 82   Temp 98.5 F (36.9 C)   Wt 100 lb (45.4 kg)   LMP 10/11/2017 (Approximate)   SpO2 99%   Breastfeeding? No   BMI 16.14 kg/m  Gen:  alert, not ill-appearing, no distress, appropriate for age HEENT: head normocephalic without obvious abnormality, conjunctiva and cornea clear, TM's clear bilaterally, nasal mucosa edematous, there is bilateral maxillary sinus tenderness, neck supple, trachea midline Pulm: Normal work of breathing, normal phonation, clear to auscultation bilaterally, no wheezes, rales or rhonchi CV: Normal rate, regular rhythm, s1 and s2 distinct, no murmurs, clicks or rubs  Neuro: alert and oriented  x 3, no tremor MSK: extremities atraumatic, normal gait and station Skin: intact, no rashes on exposed skin, no jaundice, no cyanosis   No flowsheet data found.   No results found for this or any previous visit (from the past 72 hour(s)). No results found.    Assessment and Plan: 36 y.o. female with   1. Acute non-recurrent maxillary sinusitis - patient reports allergy to Clavulanic acid, but tolerates Amoxicillin. Requested liquid Amoxicillin - amoxicillin (AMOXIL) 400 MG/5ML suspension; Take 6.3 mLs (500 mg total) 3 (three) times daily for 7 days by mouth.  Dispense: 150 mL; Refill: 0  2. Rash of neck - refill provided - betamethasone dipropionate (DIPROLENE) 0.05 % cream; betamethasone dipropionate 0.05 % topical cream  APPLY A THIN LAYER TO THE AFFECTED AREA(S) BY TOPICAL ROUTE ONCE DAILY  Dispense: 30 g; Refill: 0   Patient education and anticipatory guidance given Patient agrees with treatment plan Follow-up as needed if symptoms worsen or fail to improve  Levonne Hubertharley E. Cummings PA-C

## 2017-10-16 NOTE — Patient Instructions (Addendum)
-   Amoxicillin 500 mg three times daily for 7 days - Netty pot   Sinus Headache A sinus headache occurs when the paranasal sinuses become clogged or swollen. Paranasal sinuses are air pockets within the bones of the face. Sinus headaches can range from mild to severe. What are the causes? A sinus headache can result from various conditions that affect the sinuses, such as:  Colds.  Sinus infections.  Allergies.  What are the signs or symptoms? The main symptom of this condition is a headache that may feel like pain or pressure in the face, forehead, ears, or upper teeth. People who have a sinus headache often have other symptoms, such as:  Congested or runny nose.  Fever.  Inability to smell.  Weather changes can make symptoms worse. How is this diagnosed? This condition may be diagnosed based on:  A physical exam and medical history.  Imaging tests, such as a CT scan and MRI, to check for problems with the sinuses.  A specialist may look into the sinuses with a tool that has a camera (endoscopy).  How is this treated? Treatment for this condition depends on the cause.  Sinus pain that is caused by a sinus infection may be treated with antibiotic medicine.  Sinus pain that is caused by allergies may be helped by allergy medicines (antihistamines) and medicated nasal sprays.  Sinus pain that is caused by congestion may be helped by flushing the nose and sinuses with saline solution.  Follow these instructions at home:  Take medicines only as directed by your health care provider.  If you were prescribed an antibiotic medicine, finish all of it even if you start to feel better.  If you have congestion, use a nasal spray to help reduce pressure.  If directed, apply a warm, moist washcloth to your face to help relieve pain. Contact a health care provider if:  You have headaches more than one time each week.  You have sensitivity to light or sound.  You have a  fever.  You feel sick to your stomach (nauseous) or you throw up (vomit).  Your headaches do not get better with treatment. Many people think that they have a sinus headache when they actually have migraines or tension headaches. Get help right away if:  You have vision problems.  You have sudden, severe pain in your face or head.  You have a seizure.  You are confused.  You have a stiff neck. This information is not intended to replace advice given to you by your health care provider. Make sure you discuss any questions you have with your health care provider. Document Released: 01/05/2005 Document Revised: 07/24/2016 Document Reviewed: 11/24/2014 Elsevier Interactive Patient Education  [employer]2018 [employer]

## 2017-11-12 ENCOUNTER — Emergency Department
Admission: EM | Admit: 2017-11-12 | Discharge: 2017-11-12 | Disposition: A | Discharge status: Home / Self Care | Payer: BLUE CROSS/BLUE SHIELD | Source: Home / Self Care | Attending: Family Medicine | Admitting: Family Medicine

## 2017-11-12 ENCOUNTER — Encounter: Payer: Self-pay | Admitting: Emergency Medicine

## 2017-11-12 ENCOUNTER — Emergency Department (INDEPENDENT_AMBULATORY_CARE_PROVIDER_SITE_OTHER): Payer: BLUE CROSS/BLUE SHIELD

## 2017-11-12 DIAGNOSIS — H6692 Otitis media, unspecified, left ear: Secondary | ICD-10-CM | POA: Diagnosis not present

## 2017-11-12 DIAGNOSIS — R058 Other specified cough: Secondary | ICD-10-CM

## 2017-11-12 DIAGNOSIS — R0602 Shortness of breath: Secondary | ICD-10-CM | POA: Diagnosis not present

## 2017-11-12 DIAGNOSIS — R05 Cough: Secondary | ICD-10-CM

## 2017-11-12 DIAGNOSIS — R0789 Other chest pain: Secondary | ICD-10-CM

## 2017-11-12 DIAGNOSIS — R079 Chest pain, unspecified: Secondary | ICD-10-CM | POA: Diagnosis not present

## 2017-11-12 MED ORDER — BENZONATATE 100 MG PO CAPS: 100.0000 mg | ORAL_CAPSULE | Freq: Three times a day (TID) | ORAL | 0 refills | Status: DC

## 2017-11-12 MED ORDER — IPRATROPIUM-ALBUTEROL 0.5-2.5 (3) MG/3ML IN SOLN: 3.0000 mL | Freq: Once | RESPIRATORY_TRACT | Status: DC

## 2017-11-12 MED ORDER — AZITHROMYCIN 250 MG PO TABS: 250.0000 mg | ORAL_TABLET | Freq: Every day | ORAL | 0 refills | Status: DC

## 2017-11-12 MED ORDER — ALBUTEROL SULFATE HFA 108 (90 BASE) MCG/ACT IN AERS: 1.0000 | INHALATION_SPRAY | Freq: Four times a day (QID) | RESPIRATORY_TRACT | 0 refills | Status: DC | PRN

## 2017-11-12 MED ORDER — PREDNISONE 20 MG PO TABS: ORAL_TABLET | ORAL | 0 refills | Status: DC

## 2017-11-12 NOTE — Discharge Instructions (Signed)
°  It is very important for you to follow up with your primary care provider early next week if not improving in 2-3 days.   While croup- which is typically caused by a viral illness as well as other upper respiratory illness can cause cough and shortness of breath, if you continue to have worsening shortness of breath, it is very important to get a further evaluation to rule out other causes such as blood clots and problems with your heart which can also cause shortness of breath and cough.  Another less serious but common cause of chronic cough and chest discomfort is acid reflux/esophagitis.   Please do not hesitate to call 911 or go to closest emergency department for further evaluation and treatment if your symptoms continue to worsen.

## 2017-11-12 NOTE — ED Provider Notes (Signed)
Emily [name]    CSN: [id number] Arrival date & time: 11/12/17  1102     History   Chief Complaint Chief Complaint  Patient presents with  . Cough    HPI Emily [name] is a 36 y.o. female.   HPI  Emily [name] is a 36 y.o. female presenting to UC with c/o cough for about 2 weeks, worsening "croup-like" cough for the last 1 week. Associated chest tightness and SOB.  She reports a hx of recurrent croup so she has taken 3 days of previously prescribed prednisone 50mg  but no relief.  SOB became so bad last night, pt states she almost went to the emergency department. She does have an inhaler at home but has been told it can make croup worse so she has not tried that yet.  Per medical records, pt was seen about 1 month ago for URI symptoms, tx with amoxicillin.  She states symptoms did improve but states that she was only having sinus congestion and pain at that time, not the cough.  Denies hx of asthma or COPD. Denies hx of acid reflux. Denies hx of blood clots or heart problems. No leg pain or swelling. No known sick contacts.  She has been able to eat and drink w/o difficulty. Denies fever, chills, n/vd/.    Past Medical History:  Diagnosis Date  . Anemia    history  . Bartholin cyst 12/27/2011  . Celiac disease   . Collagenous colitis   . Delivery of twins, both live 12/02/2012  . Dichorionic diamniotic twin pregnancy 12/01/2012  . Headache(784.0)    complex migraines with cardiac and neuro symptoms during pregnancy  . Hypothyroidism   . Infertility, female   . SVD (spontaneous vaginal delivery) 12/02/2012  . Thyroid disease     Patient Active Problem List   Diagnosis Date Noted  . History of colonoscopy 03/31/2016  . Cervical cancer screening 03/31/2016  . History of celiac disease 03/31/2016  . Thyroid activity decreased 03/31/2016  . Anxiety and depression 03/31/2016  . History of anemia 03/31/2016  . Lipid screening 03/31/2016  . Medication management  03/31/2016  . Encounter for contraceptive management 03/31/2016  . Tachycardia 11/13/2012  . Hypokalemia 11/13/2012  . Disturbance of skin sensation 10/07/2012  . Complicated migraine 10/07/2012  . Bartholin cyst 12/27/2011    Past Surgical History:  Procedure Laterality Date  . BARTHOLIN CYST MARSUPIALIZATION  12/28/2011   Procedure: BARTHOLIN CYST MARSUPIALIZATION;  Surgeon: Sherron MondayJody Bovard, MD;  Location: [facility];  Service: Gynecology;  Laterality: N/A;  . COLONOSCOPY    . RHINOPLASTY    . TONSILLECTOMY    . UPPER GASTROINTESTINAL ENDOSCOPY    . WISDOM TOOTH EXTRACTION      OB History    Gravida Para Term Preterm AB Living   3 2 2     3    SAB TAB Ectopic Multiple Live Births         1 3       Home Medications    Prior to Admission medications   Medication Sig Start Date End Date Taking? Authorizing Provider  albuterol (PROVENTIL HFA;VENTOLIN HFA) 108 (90 Base) MCG/ACT inhaler Inhale 1-2 puffs into the lungs every 6 (six) hours as needed for wheezing or shortness of breath. 11/12/17   Lurene ShadowPhelps, Dyann Goodspeed O, PA-C  ALPRAZolam Prudy Feeler(XANAX) 0.25 MG tablet Take 0.25 mg by mouth daily as needed for anxiety (use sparingly to avoid dependence).    [provider]  azithromycin (ZITHROMAX) 250 MG tablet  Take 1 tablet (250 mg total) by mouth daily. Take first 2 tablets together, then 1 every day until finished. 11/12/17   Lurene Shadow, PA-C  benzonatate (TESSALON) 100 MG capsule Take 1-2 capsules (100-200 mg total) by mouth every 8 (eight) hours. 11/12/17   Lurene Shadow, PA-C  betamethasone dipropionate (DIPROLENE) 0.05 % cream betamethasone dipropionate 0.05 % topical cream  APPLY A THIN LAYER TO THE AFFECTED AREA(S) BY TOPICAL ROUTE ONCE DAILY 10/16/17   Carlis Stable, PA-C  Budesonide ER 9 MG TB24 Take by mouth.    [provider]  diphenoxylate-atropine (LOMOTIL) 2.5-0.025 MG tablet Take daily as needed by mouth.    [provider]  ferrous sulfate 325 (65  FE) MG EC tablet Take 1 tablet (325 mg total) by mouth 3 (three) times daily with meals. 05/31/17   Sunnie Nielsen, DO  ibuprofen (ADVIL,MOTRIN) 800 MG tablet Take 1 tablet (800 mg total) by mouth every 8 (eight) hours as needed. 01/09/16   Bovard-Stuckert, Augusto Gamble, MD  levothyroxine (UNITHROID) 150 MCG tablet Take 1 tablet (150 mcg total) by mouth daily before breakfast. 12/08/16   Sunnie Nielsen, DO  ondansetron (ZOFRAN) 4 MG tablet Take 4 mg by mouth as needed. 06/04/10   [provider]  predniSONE (DELTASONE) 20 MG tablet Take 2 tabs for 2 days, 1 tab for 1 day 11/12/17   Lurene Shadow, PA-C    Family History Family History  Problem Relation Age of Onset  . Heart attack Maternal Grandmother   . Rashes / Skin problems Paternal Grandmother   . Clotting disorder Paternal Uncle     Social History Social History   Tobacco Use  . Smoking status: Never Smoker  . Smokeless tobacco: Never Used  Substance Use Topics  . Alcohol use: No    Comment: socially when not pregnant  . Drug use: No     Allergies   Rifaximin; Gluten meal; Clavulanic acid; Xifaxan [rifaximin]; and Levothyroxine sodium   Review of Systems Review of Systems  Constitutional: Negative for chills and fever.  HENT: Negative for congestion, ear pain, sore throat, trouble swallowing and voice change.   Respiratory: Positive for cough, chest tightness and shortness of breath. Negative for wheezing and stridor.   Cardiovascular: Negative for chest pain, palpitations and leg swelling.  Gastrointestinal: Negative for abdominal pain, diarrhea, nausea and vomiting.  Musculoskeletal: Negative for arthralgias, back pain and myalgias.  Skin: Negative for rash.     Physical Exam Triage Vital Signs ED Triage Vitals  Enc Vitals Group     BP 11/12/17 1119 114/80     Pulse Rate 11/12/17 1119 (!) 105     Resp --      Temp 11/12/17 1119 98.5 F (36.9 C)     Temp Source 11/12/17 1119 Oral     SpO2 11/12/17  1119 100 %     Weight 11/12/17 1120 100 lb 12 oz (45.7 kg)     Height 11/12/17 1120 5\' 6"  (1.676 m)     Head Circumference --      Peak Flow --      Pain Score 11/12/17 1120 0     Pain Loc --      Pain Edu? --      Excl. in GC? --    No data found.  Updated Vital Signs BP 114/80 (BP Location: Left Arm)   Pulse (!) 105   Temp 98.5 F (36.9 C) (Oral)   Ht 5\' 6"  (1.676 m)  Wt 100 lb 12 oz (45.7 kg)   LMP 10/31/2017   SpO2 100%   BMI 16.26 kg/m     Physical Exam  Constitutional: She is oriented to person, place, and time. She appears well-developed and well-nourished.  tearful  HENT:  Head: Normocephalic and atraumatic.  Right Ear: Tympanic membrane normal.  Left Ear: Tympanic membrane is erythematous. Tympanic membrane is not bulging.  Nose: Nose normal. Right sinus exhibits no maxillary sinus tenderness and no frontal sinus tenderness. Left sinus exhibits no maxillary sinus tenderness and no frontal sinus tenderness.  Mouth/Throat: Uvula is midline, oropharynx is clear and moist and mucous membranes are normal.  Eyes: EOM are normal.  Neck: Normal range of motion. Neck supple.  Cardiovascular: Regular rhythm. Tachycardia present.  Mild tachycardia, regular rhythm  Pulmonary/Chest: Effort normal and breath sounds normal. No stridor. No respiratory distress. She has no wheezes. She has no rales.  Musculoskeletal: Normal range of motion.  Lymphadenopathy:    She has no cervical adenopathy.  Neurological: She is alert and oriented to person, place, and time.  Skin: Skin is warm and dry. She is not diaphoretic.  Psychiatric: She has a normal mood and affect. Her behavior is normal.  Nursing note and vitals reviewed.    UC Treatments / Results  Labs (all labs ordered are listed, but only abnormal results are displayed) Labs Reviewed - No data to display  EKG  EKG Interpretation None       Radiology Dg Chest 2 View  Result Date: 11/12/2017 CLINICAL DATA:  Cough,  shortness of breath, left upper chest pain. EXAM: CHEST  2 VIEW COMPARISON:  11/13/2012 FINDINGS: Mild hyperexpansion of the lungs. Heart and mediastinal contours are within normal limits. No focal opacities or effusions. No acute bony abnormality. IMPRESSION: No active cardiopulmonary disease. Electronically Signed   By: Charlett NoseKevin  Dover M.D.   On: 11/12/2017 11:52    Procedures Procedures (including critical [name] time)  Medications Ordered in UC Medications  ipratropium-albuterol (DUONEB) 0.5-2.5 (3) MG/3ML nebulizer solution 3 mL (not administered)     Initial Impression / Assessment and Plan / UC Course  I have reviewed the triage vital signs and the nursing notes.  Pertinent labs & imaging results that were available during my [name] of the patient were reviewed by me and considered in my medical decision making (see chart for details).     Reassured pt of normal O2, lung sounds and CXR Pt tearful, requesting refill on prednisone for her "croup" Discussed workup for cardiac issues or even blood clot due to reports of severe SOB and mild tachycardia. Pt declined stating she only wants to try a Zpak and prednisone Pt did try duoneb in UC, states she feels mildly improved Azithromycin prescribed, moreso for Left AOM. 3 days of prednisone refilled, but discouraged pt from taking it she feels it is not helping, or making anxiety worse. Strongly encouraged f/u with PCP this week if not improving.  Advised to call 911 or go to closest emergency department if symptoms worsening this evening or over the next few days.    Final Clinical Impressions(s) / UC Diagnoses   Final diagnoses:  Left acute otitis media  Shortness of breath  Chest tightness  Dry cough    ED Discharge Orders        Ordered    benzonatate (TESSALON) 100 MG capsule  Every 8 hours     11/12/17 1227    predniSONE (DELTASONE) 20 MG tablet  11/12/17 1227    azithromycin (ZITHROMAX) 250 MG tablet  Daily      11/12/17 1227    albuterol (PROVENTIL HFA;VENTOLIN HFA) 108 (90 Base) MCG/ACT inhaler  Every 6 hours PRN     11/12/17 1239       Controlled Substance Prescriptions [place] Controlled Substance Registry consulted? Not Applicable   Rolla Plate 11/12/17 1511

## 2017-11-12 NOTE — ED Triage Notes (Signed)
Patient complaining of a history of croup, croupy cough x 1 week, has taken Prednisone 50mg  daily for 3 days, trouble breathing.

## 2017-11-15 ENCOUNTER — Ambulatory Visit: Payer: BLUE CROSS/BLUE SHIELD | Admitting: Osteopathic Medicine

## 2017-11-15 ENCOUNTER — Encounter: Payer: Self-pay | Admitting: Osteopathic Medicine

## 2017-11-15 VITALS — BP 120/85 | HR 91 | Temp 98.2°F | Resp 16 | Ht 66.0 in | Wt 106.5 lb

## 2017-11-15 DIAGNOSIS — R05 Cough: Secondary | ICD-10-CM

## 2017-11-15 DIAGNOSIS — R058 Other specified cough: Secondary | ICD-10-CM

## 2017-11-15 MED ORDER — PREDNISONE 10 MG (48) PO TBPK: ORAL_TABLET | Freq: Every day | ORAL | 0 refills | Status: DC

## 2017-11-15 MED ORDER — BUDESONIDE 1 MG/2ML IN SUSP: 1.0000 mg | Freq: Two times a day (BID) | RESPIRATORY_TRACT | 1 refills | Status: DC

## 2017-11-15 MED ORDER — FLUTICASONE FUROATE 100 MCG/ACT IN AEPB: 1.0000 | INHALATION_SPRAY | Freq: Every day | RESPIRATORY_TRACT | 1 refills | Status: DC

## 2017-11-15 NOTE — Patient Instructions (Addendum)
Plan: I think what is going on is a post viral cough syndrome. This can cause a pretty significant cough up to a couple weeks after a cold or other viral illness. Typically we use steroids to help decrease the inflammation. I sent in some inhaled steroid medicine that you can use with a nebulizer machine, I have also printed a prescription for oral steroids tab to this if needed. See below for home remedies for cough. If cough persists or worsens, especially if you develop fever, please return to clinic for further evaluation and may need to repeat chest x-ray  Over-the-Counter Medications & Home Remedies for Upper Respiratory Illness  Note: the following list assumes no pregnancy, normal liver & kidney function and no other drug interactions. Dr. Lyn HollingsheadAlexander has highlighted medications which are safe for you to use, but these may not be appropriate for everyone. Always ask a pharmacist or qualified medical provider if you have any questions!   Aches/Pains, Fever, Headache Acetaminophen (Tylenol) 500 mg tablets - take max 2 tablets (1000 mg) every 6 hours (4 times per day)  Ibuprofen (Motrin) 200 mg tablets - take max 4 tablets (800 mg) every 6 hours*  Sinus Congestion Nasal Saline if desired Oxymetolazone (Afrin, others) sparing use due to rebound congestion, NEVER use in kids Phenylephrine (Sudafed) 10 mg tablets every 4 hours (or the 12-hour formulation)* Diphenhydramine (Benadryl) 25 mg tablets - take max 2 tablets every 4 hours  Cough & Sore Throat Prescription cough pills or syrups as directed Dextromethorphan (Robitussin, others) - cough suppressant Guaifenesin (Robitussin, Mucinex, others) - expectorant (helps cough up mucus) (Dextromethorphan and Guaifenesin also come in a combination tablet) Lozenges w/ Benzocaine + Menthol (Cepacol) Honey - as much as you want! Teas which "coat the throat" - look for ingredients Elm Bark, Licorice Root, Marshmallow Root  *Caution in patients with  high blood pressure

## 2017-11-15 NOTE — Progress Notes (Signed)
HPI: Emily [name] is a 36 y.o. female who  has a past medical history of Anemia, Bartholin cyst (12/27/2011), Celiac disease, Collagenous colitis, Delivery of twins, both live (12/02/2012), Dichorionic diamniotic twin pregnancy (12/01/2012), Headache(784.0), Hypothyroidism, Infertility, female, SVD (spontaneous vaginal delivery) (12/02/2012), and Thyroid disease.  she presents to [facility] today, 11/15/17,  for chief complaint of:  Chief Complaint  Patient presents with  . URI    cough, dizziness, ear pain, SOB    Was seen for similar symptoms by urgent care 3 days ago. Records reviewed: Complaining of cough for 2 weeks, worse over the last week. Been taking leftover prednisone without relief. Physical exam noted erythematous left tympanic membrane without bulging. Patient was tachycardic. No other abnormalities on physical exam. Azithromycin prescribed for suspected left acute otitis media, 3 days of prednisone refilled. Reporting severe shortness of breath, with this plus tachycardia discussed option for workup of cardiac issues or even blood clot but patient declines. She felt better after DuoNeb in urgent care. Advised stop prednisone if making anxiety worse.  Patient presents today with persistent cough, is quite distressed by this as she was really hoping to better by now. No fever/chills. Significant fatigue. No sinus pressure, some mild ear fullness sensation persists.    Past medical, surgical, social and family history reviewed:  Patient Active Problem List   Diagnosis Date Noted  . History of colonoscopy 03/31/2016  . Cervical cancer screening 03/31/2016  . History of celiac disease 03/31/2016  . Thyroid activity decreased 03/31/2016  . Anxiety and depression 03/31/2016  . History of anemia 03/31/2016  . Lipid screening 03/31/2016  . Medication management 03/31/2016  . Encounter for contraceptive management 03/31/2016  . Tachycardia  11/13/2012  . Hypokalemia 11/13/2012  . Disturbance of skin sensation 10/07/2012  . Complicated migraine 10/07/2012  . Bartholin cyst 12/27/2011    Past Surgical History:  Procedure Laterality Date  . BARTHOLIN CYST MARSUPIALIZATION  12/28/2011   Procedure: BARTHOLIN CYST MARSUPIALIZATION;  Surgeon: Sherron MondayJody Bovard, MD;  Location: [facility];  Service: Gynecology;  Laterality: N/A;  . COLONOSCOPY    . RHINOPLASTY    . TONSILLECTOMY    . UPPER GASTROINTESTINAL ENDOSCOPY    . WISDOM TOOTH EXTRACTION      Social History   Tobacco Use  . Smoking status: Never Smoker  . Smokeless tobacco: Never Used  Substance Use Topics  . Alcohol use: No    Comment: socially when not pregnant    Family History  Problem Relation Age of Onset  . Heart attack Maternal Grandmother   . Rashes / Skin problems Paternal Grandmother   . Clotting disorder Paternal Uncle      Current medication list and allergy/intolerance information reviewed:    Current Outpatient Medications  Medication Sig Dispense Refill  . albuterol (PROVENTIL HFA;VENTOLIN HFA) 108 (90 Base) MCG/ACT inhaler Inhale 1-2 puffs into the lungs every 6 (six) hours as needed for wheezing or shortness of breath. 1 Inhaler 0  . ALPRAZolam (XANAX) 0.25 MG tablet Take 0.25 mg by mouth daily as needed for anxiety (use sparingly to avoid dependence).    Marland Kitchen. azithromycin (ZITHROMAX) 250 MG tablet Take 1 tablet (250 mg total) by mouth daily. Take first 2 tablets together, then 1 every day until finished. 6 tablet 0  . betamethasone dipropionate (DIPROLENE) 0.05 % cream betamethasone dipropionate 0.05 % topical cream  APPLY A THIN LAYER TO THE AFFECTED AREA(S) BY TOPICAL ROUTE ONCE DAILY 30 g 0  . Budesonide  ER 9 MG TB24 Take by mouth.    . diphenoxylate-atropine (LOMOTIL) 2.5-0.025 MG tablet Take daily as needed by mouth.    . ferrous sulfate 325 (65 FE) MG EC tablet Take 1 tablet (325 mg total) by mouth 3 (three) times daily with meals. 90 tablet 1  .  ibuprofen (ADVIL,MOTRIN) 800 MG tablet Take 1 tablet (800 mg total) by mouth every 8 (eight) hours as needed. 45 tablet 1  . levothyroxine (UNITHROID) 150 MCG tablet Take 1 tablet (150 mcg total) by mouth daily before breakfast. 90 tablet 3  . ondansetron (ZOFRAN) 4 MG tablet Take 4 mg by mouth as needed.    . predniSONE (DELTASONE) 20 MG tablet Take 2 tabs for 2 days, 1 tab for 1 day 5 tablet 0   No current facility-administered medications for this visit.     Allergies  Allergen Reactions  . Rifaximin Itching and Other (See Comments)    Reaction:  Itching of throat  . Gluten Meal Diarrhea, Nausea And Vomiting and Other (See Comments)    Pt has celiac disease  . Clavulanic Acid Other (See Comments)    Flushing  . Tessalon [Benzonatate] Other (See Comments)    Lump on throat  . Xifaxan [Rifaximin]   . Levothyroxine Sodium Diarrhea    Only has GI reaction to synthroid brand      Review of Systems:  Constitutional:  No  fever, no chills, +recent illness, No unintentional weight changes. +significant fatigue.   HEENT: No  headache, no vision change, no hearing change, +sore throat, No  sinus pressure  Cardiac: No  chest pain, No  pressure, No palpitation  Respiratory:  No  shortness of breath. +dry Cough  Gastrointestinal: No  abdominal pain, No  nausea, No  vomiting, No diarrhea  Musculoskeletal: No new myalgia/arthralgia  Skin: No  Rash,  Neurologic: No  weakness, No  dizziness   Exam:  BP 120/85   Pulse 91   Temp 98.2 F (36.8 C) (Oral)   Resp 16   Ht 5\' 6"  (1.676 m)   Wt 106 lb 8 oz (48.3 kg)   LMP 10/31/2017   SpO2 100%   BMI 17.19 kg/m   Constitutional: VS see above. General Appearance: alert, well-developed, well-nourished, NAD  Eyes: Normal lids and conjunctive, non-icteric sclera  Ears, Nose, Mouth, Throat: MMM, Normal external inspection ears/nares/mouth/lips/gums. TM normal bilaterally. Pharynx/tonsils no erythema, no exudate. Nasal mucosa normal.    Neck: No masses, trachea midline. No thyroid enlargement. No tenderness/mass appreciated. No lymphadenopathy  Respiratory: Normal respiratory effort. no wheeze, no rhonchi, no rales  Cardiovascular: S1/S2 normal, no murmur, no rub/gallop auscultated. RRR.   Musculoskeletal: Gait normal.   Neurological: Normal balance/coordination. No tremor.  Skin: warm, dry, no rash  Psychiatric: Normal judgment/insight. Anxious mood and affect. Oriented x3.     Dg Chest 2 View  Result Date: 11/12/2017 CLINICAL DATA:  Cough, shortness of breath, left upper chest pain. EXAM: CHEST  2 VIEW COMPARISON:  11/13/2012 FINDINGS: Mild hyperexpansion of the lungs. Heart and mediastinal contours are within normal limits. No focal opacities or effusions. No acute bony abnormality. IMPRESSION: No active cardiopulmonary disease. Electronically Signed   By: Charlett Nose M.D.   On: 11/12/2017 11:52     ASSESSMENT/PLAN: The encounter diagnosis was Post-viral cough syndrome.   Patient have to send an inhaled steroid nebulizer as well as inhaler, she would fill whichever one was cheaper. Declines cough suppressant medication, list of OTC/home remedies was provided. Chest x-ray  personally reviewed, lungs do appear a bit hyperinflated, patient also is quite thin. May consider further workup if no improvement.   Meds ordered this encounter  Medications  . budesonide (PULMICORT) 1 MG/2ML nebulizer solution    Sig: Take 2 mLs (1 mg total) by nebulization 2 (two) times daily.    Dispense:  60 mL    Refill:  1  . DISCONTD: predniSONE (STERAPRED UNI-PAK 48 TAB) 10 MG (48) TBPK tablet    Sig: Take by mouth daily. 12-Day taper, po    Dispense:  48 tablet    Refill:  0  . predniSONE (STERAPRED UNI-PAK 48 TAB) 10 MG (48) TBPK tablet    Sig: Take by mouth daily. 12-Day taper, po    Dispense:  48 tablet    Refill:  0  . Fluticasone Furoate (ARNUITY ELLIPTA) 100 MCG/ACT AEPB    Sig: Inhale 1 puff into the lungs daily.     Dispense:  30 each    Refill:  1       Patient Instructions  Plan: I think what is going on is a post viral cough syndrome. This can cause a pretty significant cough up to a couple weeks after a cold or other viral illness. Typically we use steroids to help decrease the inflammation. I sent in some inhaled steroid medicine that you can use with a nebulizer machine, I have also printed a prescription for oral steroids tab to this if needed. See below for home remedies for cough. If cough persists or worsens, especially if you develop fever, please return to clinic for further evaluation and may need to repeat chest x-ray  Over-the-Counter Medications & Home Remedies for Upper Respiratory Illness  Note: the following list assumes no pregnancy, normal liver & kidney function and no other drug interactions. Dr. Lyn HollingsheadAlexander has highlighted medications which are safe for you to use, but these may not be appropriate for everyone. Always ask a pharmacist or qualified medical provider if you have any questions!   Aches/Pains, Fever, Headache Acetaminophen (Tylenol) 500 mg tablets - take max 2 tablets (1000 mg) every 6 hours (4 times per day)  Ibuprofen (Motrin) 200 mg tablets - take max 4 tablets (800 mg) every 6 hours*  Sinus Congestion Nasal Saline if desired Oxymetolazone (Afrin, others) sparing use due to rebound congestion, NEVER use in kids Phenylephrine (Sudafed) 10 mg tablets every 4 hours (or the 12-hour formulation)* Diphenhydramine (Benadryl) 25 mg tablets - take max 2 tablets every 4 hours  Cough & Sore Throat Prescription cough pills or syrups as directed Dextromethorphan (Robitussin, others) - cough suppressant Guaifenesin (Robitussin, Mucinex, others) - expectorant (helps cough up mucus) (Dextromethorphan and Guaifenesin also come in a combination tablet) Lozenges w/ Benzocaine + Menthol (Cepacol) Honey - as much as you want! Teas which "coat the throat" - look for ingredients  Elm Bark, Licorice Root, Marshmallow Root  *Caution in patients with high blood pressure     Visit summary with medication list and pertinent instructions was printed for patient to review. All questions at time of visit were answered - patient instructed to contact office with any additional concerns. ER/RTC precautions were reviewed with the patient. Follow-up plan: Return if symptoms worsen or fail to improve.  Note: Total time spent 24 minutes, greater than 50% of the visit was spent face-to-face counseling and coordinating care for the following: The encounter diagnosis was Post-viral cough syndrome..  Please note: voice recognition software was used to produce this document, and typos may  escape review. Please contact Dr. Sheppard Coil for any needed clarifications.

## 2017-12-26 ENCOUNTER — Other Ambulatory Visit: Payer: Self-pay | Admitting: Osteopathic Medicine

## 2018-01-30 ENCOUNTER — Other Ambulatory Visit: Payer: Self-pay | Admitting: Osteopathic Medicine

## 2018-03-20 DIAGNOSIS — F419 Anxiety disorder, unspecified: Secondary | ICD-10-CM | POA: Diagnosis not present

## 2018-03-20 DIAGNOSIS — R000 Tachycardia, unspecified: Secondary | ICD-10-CM | POA: Diagnosis not present

## 2018-03-23 ENCOUNTER — Other Ambulatory Visit: Payer: Self-pay

## 2018-03-23 ENCOUNTER — Ambulatory Visit: Payer: BLUE CROSS/BLUE SHIELD | Admitting: Osteopathic Medicine

## 2018-03-23 ENCOUNTER — Encounter: Payer: Self-pay | Admitting: Osteopathic Medicine

## 2018-03-23 VITALS — BP 124/88 | HR 98 | Temp 97.3°F | Wt 100.5 lb

## 2018-03-23 DIAGNOSIS — F339 Major depressive disorder, recurrent, unspecified: Secondary | ICD-10-CM

## 2018-03-23 DIAGNOSIS — E039 Hypothyroidism, unspecified: Secondary | ICD-10-CM | POA: Diagnosis not present

## 2018-03-23 DIAGNOSIS — F418 Other specified anxiety disorders: Secondary | ICD-10-CM | POA: Diagnosis not present

## 2018-03-23 DIAGNOSIS — R002 Palpitations: Secondary | ICD-10-CM

## 2018-03-23 LAB — TSH: TSH: 8.29 mIU/L — ABNORMAL HIGH

## 2018-03-23 MED ORDER — VORTIOXETINE HBR 5 MG PO TABS: 5.0000 mg | ORAL_TABLET | Freq: Every day | ORAL | 0 refills | Status: DC

## 2018-03-23 MED ORDER — ALPRAZOLAM 0.25 MG PO TABS
0.2500 mg | ORAL_TABLET | Freq: Every day | ORAL | 0 refills | Status: AC | PRN
Start: 2018-03-23 — End: ?

## 2018-03-23 NOTE — Patient Instructions (Signed)
Vortioxetine oral tablet What is this medicine? Vortioxetine (vor tee OX e teen) is used to treat depression. This medicine may be used for other purposes; ask your health care provider or pharmacist if you have questions. COMMON BRAND NAME(S): BRINTELLIX, Trintellix What should I tell my health care provider before I take this medicine? They need to know if you have any of these conditions: -bipolar disorder or a family history of bipolar disorder -bleeding disorders -drink alcohol -glaucoma -liver disease -low levels of sodium in the blood -seizures -suicidal thoughts, plans, or attempt; a previous suicide attempt by you or a family member -take medicines that treat or prevent blood clots -an unusual or allergic reaction to vortioxetine, other medicines, foods, dyes, or preservatives -pregnant or trying to get pregnant -breast-feeding How should I use this medicine? Take this medicine by mouth with a glass of water. Follow the directions on the prescription label. You can take it with or without food. If it upsets your stomach, take it with food. Take your medicine at regular intervals. Do not take it more often than directed. Do not stop taking this medicine suddenly except upon the advice of your doctor. Stopping this medicine too quickly may cause serious side effects or your condition may worsen. A special MedGuide will be given to you by the pharmacist with each prescription and refill. Be sure to read this information carefully each time. Talk to your pediatrician regarding the use of this medicine in children. Special care may be needed. Overdosage: If you think you have taken too much of this medicine contact a poison control center or emergency room at once. NOTE: This medicine is only for you. Do not share this medicine with others. What if I miss a dose? If you miss a dose, take it as soon as you can. If it is almost time for your next dose, take only that dose. Do not take  double or extra doses. What may interact with this medicine? Do not take this medicine with any of the following medications: -linezolid -MAOIs like Carbex, Eldepryl, Marplan, Nardil, and Parnate -methylene blue (injected into a vein) This medicine may also interact with the following medications: -alcohol -aspirin and aspirin-like medicines -carbamazepine -certain medicines for depression, anxiety, or psychotic disturbances -certain medicines for migraine headache like almotriptan, eletriptan, frovatriptan, naratriptan, rizatriptan, sumatriptan, zolmitriptan -diuretics -fentanyl -furazolidone -isoniazid -medicines that treat or prevent blood clots like warfarin, enoxaparin, and dalteparin -NSAIDs, medicines for pain and inflammation, like ibuprofen or naproxen -phenytoin -procarbazine -quinidine -rasagiline -rifampin -supplements like St. John's wort, kava kava, valerian -tramadol -tryptophan This list may not describe all possible interactions. Give your health care provider a list of all the medicines, herbs, non-prescription drugs, or dietary supplements you use. Also tell them if you smoke, drink alcohol, or use illegal drugs. Some items may interact with your medicine. What should I watch for while using this medicine? Tell your doctor if your symptoms do not get better or if they get worse. Visit your doctor or health care professional for regular checks on your progress. Because it may take several weeks to see the full effects of this medicine, it is important to continue your treatment as prescribed by your doctor. Patients and their families should watch out for new or worsening thoughts of suicide or depression. Also watch out for sudden changes in feelings such as feeling anxious, agitated, panicky, irritable, hostile, aggressive, impulsive, severely restless, overly excited and hyperactive, or not being able to sleep. If this happens,   especially at the beginning of  treatment or after a change in dose, call your health care professional. You may get drowsy or dizzy. Do not drive, use machinery, or do anything that needs mental alertness until you know how this medicine affects you. Do not stand or sit up quickly, especially if you are an older patient. This reduces the risk of dizzy or fainting spells. Alcohol may interfere with the effect of this medicine. Avoid alcoholic drinks. Your mouth may get dry. Chewing sugarless gum or sucking hard candy, and drinking plenty of water may help. Contact your doctor if the problem does not go away or is severe. What side effects may I notice from receiving this medicine? Side effects that you should report to your doctor or health care professional as soon as possible: -allergic reactions like skin rash, itching or hives, swelling of the face, lips, or tongue -anxious -black, tarry stools -changes in vision -confusion -elevated mood, decreased need for sleep, racing thoughts, impulsive behavior -eye pain -fast, irregular heartbeat -feeling faint or lightheaded, falls -feeling agitated, angry, or irritable -hallucination, loss of contact with reality -loss of balance or coordination -loss of memory -painful or prolonged erections -restlessness, pacing, inability to keep still -seizures -stiff muscles -suicidal thoughts or other mood changes -trouble sleeping -unusual bleeding or bruising -unusually weak or tired -vomiting Side effects that usually do not require medical attention (report to your doctor or health care professional if they continue or are bothersome): -change in appetite or weight -change in sex drive or performance -constipation -dizziness -dry mouth -nausea This list may not describe all possible side effects. Call your doctor for medical advice about side effects. You may report side effects to FDA at 1-800-FDA-1088. Where should I keep my medicine? Keep out of the reach of  children. Store at room temperature between 15 and 30 degrees C (59 and 86 degrees F). Throw away any unused medicine after the expiration date. NOTE: This sheet is a summary. It may not cover all possible information. If you have questions about this medicine, talk to your doctor, pharmacist, or health care provider.  2018 [employer]vier/Gold Standard (2016-04-28 16:45:13)  

## 2018-03-23 NOTE — Progress Notes (Signed)
HPI: Emily [name] is a 37 y.o. female who  has a past medical history of Anemia, Bartholin cyst (12/27/2011), Celiac disease, Collagenous colitis, Delivery of twins, both live (12/02/2012), Dichorionic diamniotic twin pregnancy (12/01/2012), Headache(784.0), Hypothyroidism, Infertility, female, SVD (spontaneous vaginal delivery) (12/02/2012), and Thyroid disease.  she presents to [facility] today, 03/23/18,  for chief complaint of: Reaction to dental procedure Lightheadedness, anxiety  Numbing medicine at that dentist's office which contained epinephrine and she experienced drop in O2 levels (80% per patient, no records available), elevated HR and SOB. Was taken via EMS to ER but left AMA prior to being seen.   Reports persistent feeling of lightheadedness/wooziness, worse with going from lying to sitting or sitting to standing. No loss of consciousness. She had an episode in th office today where she just really felt like she had to lie down,CMA had some difficulty getting tremors to stop when she was obtaining EKG, but was able to calm the patient okay.   History of anxiety/depression, she states that these issues are get out of control and affecting her QOL. She has been on several medications in the past without success. Medical she can recall as of today's visit include: Prozac, Wellbutrin, Elavil.   Orthostatics: Lying 124/86 90 Sitting 128/87 101 Standing 120/87 107 SaO2 100%  Previous cardiac workup no concerns.   Past medical, surgical, social and family history reviewed:  Patient Active Problem List   Diagnosis Date Noted  . History of colonoscopy 03/31/2016  . Cervical cancer screening 03/31/2016  . History of celiac disease 03/31/2016  . Thyroid activity decreased 03/31/2016  . Anxiety and depression 03/31/2016  . History of anemia 03/31/2016  . Lipid screening 03/31/2016  . Medication management 03/31/2016  . Encounter for  contraceptive management 03/31/2016  . Tachycardia 11/13/2012  . Hypokalemia 11/13/2012  . Disturbance of skin sensation 10/07/2012  . Complicated migraine 10/07/2012  . Bartholin cyst 12/27/2011    Past Surgical History:  Procedure Laterality Date  . BARTHOLIN CYST MARSUPIALIZATION  12/28/2011   Procedure: BARTHOLIN CYST MARSUPIALIZATION;  Surgeon: Sherron MondayJody Bovard, MD;  Location: [facility];  Service: Gynecology;  Laterality: N/A;  . COLONOSCOPY    . RHINOPLASTY    . TONSILLECTOMY    . UPPER GASTROINTESTINAL ENDOSCOPY    . WISDOM TOOTH EXTRACTION      Social History   Tobacco Use  . Smoking status: Never Smoker  . Smokeless tobacco: Never Used  Substance Use Topics  . Alcohol use: No    Comment: socially when not pregnant    Family History  Problem Relation Age of Onset  . Heart attack Maternal Grandmother   . Rashes / Skin problems Paternal Grandmother   . Clotting disorder Paternal Uncle      Current medication list and allergy/intolerance information reviewed:    Current Outpatient Medications  Medication Sig Dispense Refill  . ALPRAZolam (XANAX) 0.25 MG tablet Take 1 tablet (0.25 mg total) by mouth daily as needed for anxiety (use sparingly to avoid dependence). 30 tablet 0  . betamethasone dipropionate (DIPROLENE) 0.05 % cream betamethasone dipropionate 0.05 % topical cream  APPLY A THIN LAYER TO THE AFFECTED AREA(S) BY TOPICAL ROUTE ONCE DAILY 30 g 0  . budesonide (PULMICORT) 1 MG/2ML nebulizer solution Take 2 mLs (1 mg total) by nebulization 2 (two) times daily. 60 mL 1  . diphenoxylate-atropine (LOMOTIL) 2.5-0.025 MG tablet Take daily as needed by mouth.    . ferrous sulfate 325 (65 FE) MG EC  tablet Take 1 tablet (325 mg total) by mouth 3 (three) times daily with meals. 90 tablet 1  . ibuprofen (ADVIL,MOTRIN) 800 MG tablet Take 1 tablet (800 mg total) by mouth every 8 (eight) hours as needed. 45 tablet 1  . ondansetron (ZOFRAN) 4 MG tablet Take 4 mg by mouth as needed.     Marland Kitchen UNITHROID 150 MCG tablet TAKE 1 TABLET (150 MCG TOTAL) BY MOUTH DAILY BEFORE BREAKFAST. 90 tablet 0  . Budesonide ER 9 MG TB24 Take by mouth.    . clomiPHENE (CLOMID) 50 MG tablet Take by mouth.     No current facility-administered medications for this visit.     Allergies  Allergen Reactions  . Rifaximin Itching and Other (See Comments)    Reaction:  Itching of throat  . Gluten Meal Diarrhea, Nausea And Vomiting and Other (See Comments)    Pt has celiac disease  . Clavulanic Acid Other (See Comments)    Flushing  . Tessalon [Benzonatate] Other (See Comments)    Lump on throat  . Xifaxan [Rifaximin]   . Levothyroxine Sodium Diarrhea    Only has GI reaction to synthroid brand      Review of Systems:  Constitutional:  No  fever, no chills, No recent illness, No unintentional weight changes. +significant fatigue.   HEENT: +headache, no vision change, no hearing change, No sore throat, No  sinus pressure  Cardiac: No  chest pain, No  pressure, +palpitations, No  Orthopnea  Respiratory:  No  shortness of breath. No  Cough  Gastrointestinal: No  abdominal pain, No  nausea, No  vomiting,  No  blood in stool, No  diarrhea, No  constipation   Musculoskeletal: No new myalgia/arthralgia  Neurologic: No  weakness, +lightheaded but no vertigo type dizziness, No  slurred speech/focal weakness/facial droop  Psychiatric: +concerns with depression, +concerns with anxiety, No sleep problems, No mood problems  Exam:  BP 124/88 (BP Location: Left Arm, Patient Position: Sitting, Cuff Size: Small)   Pulse 98   Temp (!) 97.3 F (36.3 C) (Oral)   Wt 100 lb 8 oz (45.6 kg)   SpO2 100%   BMI 16.22 kg/m   Constitutional: VS see above. General Appearance: alert, well-developed, well-nourished, NAD  Eyes: Normal lids and conjunctive, non-icteric sclera  Ears, Nose, Mouth, Throat: MMM, Normal external inspection ears/nares/mouth/lips/gums.   Neck: No masses, trachea midline. No thyroid  enlargement. No tenderness/mass appreciated. No lymphadenopathy  Respiratory: Normal respiratory effort. no wheeze, no rhonchi, no rales  Cardiovascular: S1/S2 normal, no murmur, no rub/gallop auscultated. RRR. No lower extremity edema.   Musculoskeletal: Gait normal.   Neurological: Normal balance/coordination. No tremor. No cranial nerve deficit on limited exam. Motor and sensation intact and symmetric. Cerebellar reflexes intact.   Skin: warm, dry, intact. No rash/ulcer. No concerning nevi or subq nodules on limited exam.    Psychiatric: Normal judgment/insight. Anxious, tearful mood and affect. Oriented x3.    Results for orders placed or performed in visit on 03/23/18 (from the past 72 hour(s))  TSH     Status: Abnormal   Collection Time: 03/23/18 12:00 PM  Result Value Ref Range   TSH 8.29 (H) mIU/L    Comment:           Reference Range .           > or = 20 Years  0.40-4.50 .                Pregnancy Ranges  First trimester    0.26-2.66           Second trimester   0.55-2.73           Third trimester    0.43-2.91     EKG: no concerns, possible new incomplete RBB  ASSESSMENT/PLAN:   Palpitation - I think mostly anxiety related, would at least get Holter monitor, strong consider echo but patient would like to derfer for now, declines other labs - Plan: EKG 12-Lead, ECHOCARDIOGRAM COMPLETE, Holter monitor - 48 hour  Depression, recurrent (HCC) - Plan: vortioxetine HBr (TRINTELLIX) 5 MG TABS tablet  Hypothyroidism, unspecified type - will discuss with patient medication adjustment versus whether this can be followed given recent significant stressful situations - Plan: TSH  Situational anxiety - till needs dental work completed, as one can imagine she has issues going the dentist after last reaction, ok to take alprazolam 0.25mg  2-3 tabs prior - Plan: ALPRAZolam (XANAX) 0.25 MG tablet       Visit summary with medication list and pertinent instructions was  printed for patient to review. All questions at time of visit were answered - patient instructed to contact office with any additional concerns. ER/RTC precautions were reviewed with the patient.   Follow-up plan: Return in about 1 week (around 03/30/2018) for recheck anxiety and lightheadedness .  Note: Total time spent 40 minutes, greater than 50% of the visit was spent face-to-face counseling and coordinating care for the following: The primary encounter diagnosis was Palpitation. Diagnoses of Depression, recurrent (HCC), Hypothyroidism, unspecified type, and Situational anxiety were also pertinent to this visit.Marland Kitchen  Please note: voice recognition software was used to produce this document, and typos may escape review. Please contact Dr. Lyn Hollingshead for any needed clarifications.

## 2018-03-24 ENCOUNTER — Encounter: Payer: Self-pay | Admitting: Osteopathic Medicine

## 2018-03-25 ENCOUNTER — Encounter: Payer: Self-pay | Admitting: Osteopathic Medicine

## 2018-03-26 ENCOUNTER — Other Ambulatory Visit: Payer: Self-pay | Admitting: Osteopathic Medicine

## 2018-03-26 MED ORDER — MECLIZINE HCL 25 MG PO TABS: 12.5000 mg | ORAL_TABLET | Freq: Three times a day (TID) | ORAL | 2 refills | Status: DC | PRN

## 2018-03-26 NOTE — Progress Notes (Signed)
Meclizine

## 2018-03-27 ENCOUNTER — Ambulatory Visit: Payer: BLUE CROSS/BLUE SHIELD

## 2018-03-27 DIAGNOSIS — R002 Palpitations: Secondary | ICD-10-CM | POA: Diagnosis not present

## 2018-03-28 ENCOUNTER — Ambulatory Visit: Payer: BLUE CROSS/BLUE SHIELD | Admitting: Osteopathic Medicine

## 2018-03-29 ENCOUNTER — Telehealth: Payer: Self-pay

## 2018-03-29 NOTE — Telephone Encounter (Signed)
PT called stating she just returned holter monitor after 48 hrs. She is going to make an appt the end of next week, so that provider will have access to the results.

## 2018-04-05 ENCOUNTER — Ambulatory Visit: Payer: BLUE CROSS/BLUE SHIELD | Admitting: Osteopathic Medicine

## 2018-04-05 ENCOUNTER — Encounter: Payer: Self-pay | Admitting: Osteopathic Medicine

## 2018-04-05 VITALS — BP 117/75 | HR 94 | Temp 97.9°F | Wt 99.4 lb

## 2018-04-05 DIAGNOSIS — R002 Palpitations: Secondary | ICD-10-CM | POA: Diagnosis not present

## 2018-04-05 DIAGNOSIS — F339 Major depressive disorder, recurrent, unspecified: Secondary | ICD-10-CM

## 2018-04-05 DIAGNOSIS — R000 Tachycardia, unspecified: Secondary | ICD-10-CM

## 2018-04-05 NOTE — Progress Notes (Signed)
HPI: Emily [name] is a 37 y.o. female who  has a past medical history of Anemia, Bartholin cyst (12/27/2011), Celiac disease, Collagenous colitis, Delivery of twins, both live (12/02/2012), Dichorionic diamniotic twin pregnancy (12/01/2012), Headache(784.0), Hypothyroidism, Infertility, female, SVD (spontaneous vaginal delivery) (12/02/2012), and Thyroid disease.  she presents to [facility] today, 04/05/18,  for chief complaint of:  Following up on Holter results and palpitations/breathing  Notes that symptoms are a lot better.  Is reassured by a fairly normal Holter monitor results.  There is certainly some sinus tachycardia present, the heart rate up into the 170s but no arrhythmia other than a few premature beats.  Still having some issues with anxiety/depression.  We are working on getting [employer] coverage, I sent a message to our prior auth person regarding this prescription.   Past medical history, surgical history, and family history reviewed.  Current medication list and allergy/intolerance information reviewed.   (See remainder of HPI, as well as ROS, PE below)    ASSESSMENT/PLAN: The primary encounter diagnosis was Sinus tachycardia seen on cardiac monitor. Diagnoses of Palpitation and Depression, recurrent (HCC) were also pertinent to this visit.  Issues are stable at this point, no concerns on blood work or Holter monitor, patient declines to proceed with echocardiogram sinus tachycardia, will monitor closely, offered beta-blocker therapy but she declines this which I think is reasonable given her anxiety around medications.  Follow-up plan: Return for recheck as needed / whenever due for annual .         ####################################### ####################################### #######################################     Outpatient Encounter Medications as of 04/05/2018  Medication Sig  . ALPRAZolam (XANAX) 0.25 MG  tablet Take 1 tablet (0.25 mg total) by mouth daily as needed for anxiety (use sparingly to avoid dependence).  . betamethasone dipropionate (DIPROLENE) 0.05 % cream betamethasone dipropionate 0.05 % topical cream  APPLY A THIN LAYER TO THE AFFECTED AREA(S) BY TOPICAL ROUTE ONCE DAILY  . budesonide (PULMICORT) 1 MG/2ML nebulizer solution Take 2 mLs (1 mg total) by nebulization 2 (two) times daily.  . Budesonide ER 9 MG TB24 Take by mouth.  . clomiPHENE (CLOMID) 50 MG tablet Take by mouth.  . diphenoxylate-atropine (LOMOTIL) 2.5-0.025 MG tablet Take daily as needed by mouth.  . ferrous sulfate 325 (65 FE) MG EC tablet Take 1 tablet (325 mg total) by mouth 3 (three) times daily with meals.  Marland Kitchen. ibuprofen (ADVIL,MOTRIN) 800 MG tablet Take 1 tablet (800 mg total) by mouth every 8 (eight) hours as needed.  . meclizine (ANTIVERT) 25 MG tablet Take 0.5-1 tablets (12.5-25 mg total) by mouth 3 (three) times daily as needed for dizziness.  . ondansetron (ZOFRAN) 4 MG tablet Take 4 mg by mouth as needed.  Marland Kitchen. UNITHROID 150 MCG tablet TAKE 1 TABLET (150 MCG TOTAL) BY MOUTH DAILY BEFORE BREAKFAST.  Marland Kitchen. vortioxetine HBr (TRINTELLIX) 5 MG TABS tablet Take 1 tablet (5 mg total) by mouth daily.   No facility-administered encounter medications on file as of 04/05/2018.    Allergies  Allergen Reactions  . Rifaximin Itching and Other (See Comments)    Reaction:  Itching of throat  . Gluten Meal Diarrhea, Nausea And Vomiting and Other (See Comments)    Pt has celiac disease  . Clavulanic Acid Other (See Comments)    Flushing  . Tessalon [Benzonatate] Other (See Comments)    Lump on throat  . Xifaxan [Rifaximin]   . Levothyroxine Sodium Diarrhea    Only has GI reaction to  synthroid brand      Review of Systems:  Constitutional: No recent illness  Cardiac: No  chest pain, No  pressure, +palpitations  Respiratory:  No  shortness of breath. No  Cough  Gastrointestinal: No  abdominal pain  Musculoskeletal:  No new myalgia/arthralgia  Neurologic: No  weakness, No  Dizziness  Psychiatric: +concerns with depression, +concerns with anxiety  Exam:  BP 117/75 (BP Location: Left Arm, Patient Position: Sitting, Cuff Size: Small)   Pulse 94   Temp 97.9 F (36.6 C) (Oral)   Wt 99 lb 6.4 oz (45.1 kg)   LMP 03/20/2018   BMI 16.04 kg/m   Constitutional: VS see above. General Appearance: alert, well-developed, well-nourished, NAD  Eyes: Normal lids and conjunctive, non-icteric sclera  Ears, Nose, Mouth, Throat: MMM, Normal external inspection ears/nares/mouth/lips/gums.  Neck: No masses, trachea midline.   Respiratory: Normal respiratory effort. no wheeze, no rhonchi, no rales  Cardiovascular: S1/S2 normal, no murmur, no rub/gallop auscultated. RRR.   Musculoskeletal: Gait normal. Symmetric and independent movement of all extremities  Neurological: Normal balance/coordination. No tremor.  Skin: warm, dry, intact.   Psychiatric: Normal judgment/insight. Normal mood and affect. Oriented x3.   Visit summary with medication list and pertinent instructions was printed for patient to review, alert [place] if any changes needed. All questions at time of visit were answered - patient instructed to contact office with any additional concerns. ER/RTC precautions were reviewed with the patient and understanding verbalized.   Follow-up plan: Return for recheck as needed / whenever due for annual .  Note: Total time spent 25 minutes, greater than 50% of the visit was spent face-to-face counseling and coordinating care for the following: The primary encounter diagnosis was Sinus tachycardia seen on cardiac monitor. Diagnoses of Palpitation and Depression, recurrent (HCC) were also pertinent to this visit.Marland Kitchen  Please note: voice recognition software was used to produce this document, and typos may escape review. Please contact Dr. Lyn Hollingshead for any needed clarifications.

## 2018-04-06 ENCOUNTER — Encounter: Payer: Self-pay | Admitting: Osteopathic Medicine

## 2018-04-06 DIAGNOSIS — R000 Tachycardia, unspecified: Secondary | ICD-10-CM | POA: Insufficient documentation

## 2018-04-09 ENCOUNTER — Ambulatory Visit: Payer: BLUE CROSS/BLUE SHIELD | Admitting: Osteopathic Medicine

## 2018-04-11 DIAGNOSIS — K52839 Microscopic colitis, unspecified: Secondary | ICD-10-CM | POA: Diagnosis not present

## 2018-04-11 DIAGNOSIS — K900 Celiac disease: Secondary | ICD-10-CM | POA: Diagnosis not present

## 2018-04-21 ENCOUNTER — Other Ambulatory Visit: Payer: Self-pay | Admitting: Osteopathic Medicine

## 2018-04-21 DIAGNOSIS — E039 Hypothyroidism, unspecified: Secondary | ICD-10-CM

## 2018-04-23 ENCOUNTER — Other Ambulatory Visit (HOSPITAL_BASED_OUTPATIENT_CLINIC_OR_DEPARTMENT_OTHER): Payer: BLUE CROSS/BLUE SHIELD

## 2018-04-23 NOTE — Telephone Encounter (Signed)
[facility] requesting med RF for Unithroid med. As per provider's note on lab results -  "TSH levels are high indicating thyroid medication dose may be too low, but thyroid level abnormalities are also seen in acute distress/illness. We can discuss medication adjustment versus just keeping an eye on the levels in the next couple of weeks at next visit." Pls advise if RF appropriate. Thanks.

## 2018-04-23 NOTE — Telephone Encounter (Signed)
I put an order for TSH, she can come to the lab anytime to get that drawn.  Does not have to be fasting.  OK to refill for now, I sent it in

## 2018-04-24 NOTE — Telephone Encounter (Signed)
Pt has been updated. Pt will complete lab order this week.

## 2018-04-30 DIAGNOSIS — E039 Hypothyroidism, unspecified: Secondary | ICD-10-CM | POA: Diagnosis not present

## 2018-05-05 LAB — TEST AUTHORIZATION

## 2018-05-05 LAB — T4, FREE: Free T4: 2 ng/dL — ABNORMAL HIGH (ref 0.8–1.8)

## 2018-05-05 LAB — T3: T3 TOTAL: 97 ng/dL (ref 76–181)

## 2018-05-05 LAB — TSH: TSH: 0.13 mIU/L — ABNORMAL LOW

## 2018-05-09 ENCOUNTER — Other Ambulatory Visit: Payer: Self-pay | Admitting: Osteopathic Medicine

## 2018-05-09 MED ORDER — LEVOTHYROXINE SODIUM 137 MCG PO TABS: 137.0000 ug | ORAL_TABLET | Freq: Every day | ORAL | 0 refills | Status: DC

## 2018-05-13 DIAGNOSIS — J40 Bronchitis, not specified as acute or chronic: Secondary | ICD-10-CM | POA: Diagnosis not present

## 2018-05-13 DIAGNOSIS — Z681 Body mass index (BMI) 19 or less, adult: Secondary | ICD-10-CM | POA: Diagnosis not present

## 2018-05-14 ENCOUNTER — Encounter: Payer: Self-pay | Admitting: Osteopathic Medicine

## 2018-08-02 ENCOUNTER — Telehealth: Payer: Self-pay

## 2018-08-02 DIAGNOSIS — R519 Headache, unspecified: Secondary | ICD-10-CM

## 2018-08-02 DIAGNOSIS — G8929 Other chronic pain: Secondary | ICD-10-CM

## 2018-08-02 DIAGNOSIS — R51 Headache: Principal | ICD-10-CM

## 2018-08-02 NOTE — Telephone Encounter (Signed)
Pt called requesting a referral for new Neurologist. She will be going to [facility] [address]

## 2018-08-03 NOTE — Telephone Encounter (Signed)
Referral in

## 2018-08-16 DIAGNOSIS — E039 Hypothyroidism, unspecified: Secondary | ICD-10-CM | POA: Diagnosis not present

## 2018-08-18 LAB — T3: T3, Total: 84 ng/dL (ref 76–181)

## 2018-08-18 LAB — TEST AUTHORIZATION

## 2018-08-18 LAB — T4, FREE: Free T4: 1.4 ng/dL (ref 0.8–1.8)

## 2018-08-18 LAB — TSH: TSH: 1.04 mIU/L

## 2018-08-20 ENCOUNTER — Other Ambulatory Visit: Payer: Self-pay | Admitting: Osteopathic Medicine

## 2018-08-20 MED ORDER — LEVOTHYROXINE SODIUM 137 MCG PO TABS: 137.0000 ug | ORAL_TABLET | Freq: Every day | ORAL | 3 refills | Status: DC

## 2018-08-20 NOTE — Progress Notes (Signed)
Refill synthroid

## 2018-08-21 NOTE — Telephone Encounter (Signed)
Pt has seen results on MyChart and message also sent for patient to call back if any questions.

## 2018-09-05 DIAGNOSIS — G43709 Chronic migraine without aura, not intractable, without status migrainosus: Secondary | ICD-10-CM | POA: Diagnosis not present

## 2018-11-13 ENCOUNTER — Ambulatory Visit: Payer: BLUE CROSS/BLUE SHIELD | Admitting: Physician Assistant

## 2018-11-13 ENCOUNTER — Encounter: Payer: Self-pay | Admitting: Physician Assistant

## 2018-11-13 VITALS — BP 125/87 | HR 105 | Temp 98.8°F | Ht 66.0 in | Wt 101.0 lb

## 2018-11-13 DIAGNOSIS — J029 Acute pharyngitis, unspecified: Secondary | ICD-10-CM

## 2018-11-13 MED ORDER — AMOXICILLIN 400 MG/5ML PO SUSR: 500.0000 mg | Freq: Two times a day (BID) | ORAL | 0 refills | Status: DC

## 2018-11-13 NOTE — Patient Instructions (Signed)

## 2018-11-13 NOTE — Progress Notes (Signed)
Subjective:    Patient ID: Emily [name], female    DOB: 11-26-1981, 37 y.o.   MRN: [id number]  HPI  Patient is a 37 year old female who presents to the clinic with 5 days of sore throat only.  She describes a sore throat like razor blades.  She is not able to sleep due to pain.  She has no other sick contacts.  She denies any other upper respiratory or lower respiratory symptoms.  She denies any cough, shortness of breath, wheezing, sinus pressure, nose congestion, ear pain, headache.  No fever. She has felt fatigued and hot and cold. She has been taking Tylenol and ibuprofen with little relief.  She has a history of strep and had her tonsils removed when she was 37 years old. She admits to having strep after having tonsils removed.   .. Active Ambulatory Problems    Diagnosis Date Noted  . Bartholin cyst 12/27/2011  . Disturbance of skin sensation 10/07/2012  . Complicated migraine 10/07/2012  . Tachycardia 11/13/2012  . Hypokalemia 11/13/2012  . History of colonoscopy 03/31/2016  . Cervical cancer screening 03/31/2016  . History of celiac disease 03/31/2016  . Thyroid activity decreased 03/31/2016  . Anxiety and depression 03/31/2016  . History of anemia 03/31/2016  . Lipid screening 03/31/2016  . Medication management 03/31/2016  . Encounter for contraceptive management 03/31/2016  . Sinus tachycardia seen on cardiac monitor 04/06/2018   Resolved Ambulatory Problems    Diagnosis Date Noted  . Anemia 11/13/2012  . Leukocytosis, of unclear significance 11/13/2012  . Pregnancy, 8 mos with twins 11/13/2012  . Dichorionic diamniotic twin pregnancy 12/01/2012  . SVD (spontaneous vaginal delivery) 12/02/2012  . Delivery of twins, both live 12/02/2012  . Preterm labor in third trimester without delivery 12/20/2015  . Indication for care in labor and delivery, antepartum 01/08/2016  . NSVD (normal spontaneous vaginal delivery) 01/08/2016   Past Medical History:  Diagnosis Date   . Celiac disease   . Collagenous colitis   . Headache(784.0)   . Hypothyroidism   . Infertility, female   . Thyroid disease          Review of Systems See HPI.     Objective:   Physical Exam  Constitutional: She is oriented to person, place, and time. She appears well-developed and well-nourished.  HENT:  Head: Normocephalic and atraumatic.  Right Ear: External ear normal.  Left Ear: External ear normal.  Nose: Nose normal.  TM's clear.  Oropharynx erythematous with PND. No tonsils.  Uvula midline.   Eyes: Conjunctivae are normal.  Neck: Normal range of motion.  Tender bilateral adenopathy.   Cardiovascular: Normal rate and regular rhythm.  Pulmonary/Chest: Effort normal and breath sounds normal.  Lymphadenopathy:    She has cervical adenopathy.  Neurological: She is alert and oriented to person, place, and time.  Psychiatric: She has a normal mood and affect. Her behavior is normal.          Assessment & Plan:  Marland Kitchen.Marland Kitchen.Diagnoses and all orders for this visit:  Acute pharyngitis, unspecified etiology -     amoxicillin (AMOXIL) 400 MG/5ML suspension; Take 6.3 mLs (500 mg total) by mouth 2 (two) times daily. For 10 days.  unlikely to be viral with no other symptoms.   Centor Criteria: 37, no tonsils, tender adenopathy, no fever, no cough ST for 5 days. Pt begs not to do strep test due to gag reflex.   Pt has hx of strep and reports symptoms to be identical. Low  centor score. Will treat. Pt request liquid. HO given for symptomatic care. Follow up as needed.

## 2018-11-16 ENCOUNTER — Ambulatory Visit: Payer: BLUE CROSS/BLUE SHIELD | Admitting: Physician Assistant

## 2018-11-16 ENCOUNTER — Encounter: Payer: Self-pay | Admitting: Physician Assistant

## 2018-11-16 VITALS — BP 118/84 | HR 108 | Temp 98.1°F | Wt 101.0 lb

## 2018-11-16 DIAGNOSIS — R000 Tachycardia, unspecified: Secondary | ICD-10-CM

## 2018-11-16 DIAGNOSIS — J111 Influenza due to unidentified influenza virus with other respiratory manifestations: Secondary | ICD-10-CM | POA: Diagnosis not present

## 2018-11-16 DIAGNOSIS — I491 Atrial premature depolarization: Secondary | ICD-10-CM

## 2018-11-16 DIAGNOSIS — R002 Palpitations: Secondary | ICD-10-CM | POA: Diagnosis not present

## 2018-11-16 DIAGNOSIS — R69 Illness, unspecified: Secondary | ICD-10-CM | POA: Diagnosis not present

## 2018-11-16 DIAGNOSIS — I493 Ventricular premature depolarization: Secondary | ICD-10-CM | POA: Diagnosis not present

## 2018-11-16 LAB — POCT INFLUENZA A/B
INFLUENZA A, POC: NEGATIVE
Influenza B, POC: NEGATIVE

## 2018-11-16 NOTE — Progress Notes (Signed)
HPI:                                                                Emily [name] is a 37 y.o. female who presents to [name]: Primary Care Sports Medicine today for flu-like symptoms  Influenza  This is a new problem. The current episode started yesterday (x7-8 days). The problem occurs constantly. The problem has been unchanged. Associated symptoms include chills, myalgias and a sore throat (improving). Pertinent negatives include no arthralgias, fatigue, fever or headaches. Associated symptoms comments: + rhinorrhea. Nothing aggravates the symptoms. She has tried NSAIDs (antibiotics) for the symptoms. The treatment provided mild relief.  Treated empirically on 11/13/18 for Strep A with Amoxicillin. She was feeling improved.  Reports getting "hit like a freight train" yesterday around 11 am with chills and myalgias.  She states she has dealt with palpitations since 2017. She states she gets irregular heart beats associated with increased heart rate for several seconds at a time 1-2 days per week. No associated chest pain, dyspnea, diaphoresis. She underwent a 48-holter monitor in April 2019 for this.She had 2 PAC's and 1 PVC as well as sinus tachycardia. She also had an echo in November 2013, which was normal, EF>55% PMH significant for hypothyroidism Family hx brother has an unspecified heart arrhythmia. Maternal grandmother died of a heart attack in her 58's.  Past Medical History:  Diagnosis Date  . Anemia    history  . Bartholin cyst 12/27/2011  . Celiac disease   . Collagenous colitis   . Delivery of twins, both live 12/02/2012  . Dichorionic diamniotic twin pregnancy 12/01/2012  . Headache(784.0)    complex migraines with cardiac and neuro symptoms during pregnancy  . Hypothyroidism   . Infertility, female   . SVD (spontaneous vaginal delivery) 12/02/2012  . Thyroid disease    Past Surgical History:  Procedure Laterality Date  . BARTHOLIN CYST  MARSUPIALIZATION  12/28/2011   Procedure: BARTHOLIN CYST MARSUPIALIZATION;  Surgeon: Sherron Monday, MD;  Location: [facility];  Service: Gynecology;  Laterality: N/A;  . COLONOSCOPY    . RHINOPLASTY    . TONSILLECTOMY    . UPPER GASTROINTESTINAL ENDOSCOPY    . WISDOM TOOTH EXTRACTION     Social History   Tobacco Use  . Smoking status: Never Smoker  . Smokeless tobacco: Never Used  Substance Use Topics  . Alcohol use: No    Comment: socially when not pregnant   family history includes Clotting disorder in her paternal uncle; Heart attack in her maternal grandmother; Rashes / Skin problems in her paternal grandmother.    ROS: negative except as noted in the HPI  Medications: Current Outpatient Medications  Medication Sig Dispense Refill  . ALPRAZolam (XANAX) 0.25 MG tablet Take 1 tablet (0.25 mg total) by mouth daily as needed for anxiety (use sparingly to avoid dependence). 30 tablet 0  . amoxicillin (AMOXIL) 400 MG/5ML suspension Take 6.3 mLs (500 mg total) by mouth 2 (two) times daily. For 10 days. 150 mL 0  . betamethasone dipropionate (DIPROLENE) 0.05 % cream betamethasone dipropionate 0.05 % topical cream  APPLY A THIN LAYER TO THE AFFECTED AREA(S) BY TOPICAL ROUTE ONCE DAILY 30 g 0  . budesonide (PULMICORT) 1 MG/2ML nebulizer solution Take 2 mLs (1  mg total) by nebulization 2 (two) times daily. 60 mL 1  . Budesonide ER 9 MG TB24 Take by mouth.    . clomiPHENE (CLOMID) 50 MG tablet Take by mouth.    . diphenoxylate-atropine (LOMOTIL) 2.5-0.025 MG tablet Take daily as needed by mouth.    . ferrous sulfate 325 (65 FE) MG EC tablet Take 1 tablet (325 mg total) by mouth 3 (three) times daily with meals. 90 tablet 1  . ibuprofen (ADVIL,MOTRIN) 800 MG tablet Take 1 tablet (800 mg total) by mouth every 8 (eight) hours as needed. 45 tablet 1  . levothyroxine (UNITHROID) 137 MCG tablet Take 1 tablet (137 mcg total) by mouth daily before breakfast. 90 tablet 3  . meclizine (ANTIVERT) 25 MG  tablet Take 0.5-1 tablets (12.5-25 mg total) by mouth 3 (three) times daily as needed for dizziness. 30 tablet 2  . ondansetron (ZOFRAN) 4 MG tablet Take 4 mg by mouth as needed.    . vortioxetine HBr (TRINTELLIX) 5 MG TABS tablet Take 1 tablet (5 mg total) by mouth daily. 30 tablet 0   No current facility-administered medications for this visit.    Allergies  Allergen Reactions  . Rifaximin Itching and Other (See Comments)    Reaction:  Itching of throat  . Gluten Meal Diarrhea, Nausea And Vomiting and Other (See Comments)    Pt has celiac disease  . Clavulanic Acid Other (See Comments)    Flushing  . Tessalon [Benzonatate] Other (See Comments)    Lump on throat  . Xifaxan [Rifaximin]   . Levothyroxine Sodium Diarrhea    Only has GI reaction to synthroid brand       Objective:  BP 118/84   Pulse (!) 108   Temp 98.1 F (36.7 C)   Wt 101 lb (45.8 kg)   SpO2 99%   BMI 16.30 kg/m  Gen:  alert, not ill-appearing, no distress, appropriate for age, petite female HEENT: head normocephalic without obvious abnormality, conjunctiva and cornea clear, TM's pearly gray and semi-transparent, oropharynx clear, no redness or edema, tonsils absent, trachea midline Pulm: Normal work of breathing, normal phonation, clear to auscultation bilaterally, no wheezes, rales or rhonchi CV: mildly tachycardic rate, regular rhythm, s1 and s2 distinct, no murmurs, clicks or rubs  Neuro: alert and oriented x 3, no tremor MSK: extremities atraumatic, normal gait and station Skin: intact, no rashes on exposed skin, no jaundice, no cyanosis  ECG 11/16/18  Vent rate 89 bpm PR-I 126 ms QRS 102 ms QT/QTc 368/447 Normal sinus rhythm  Lab Results  Component Value Date   TSH 1.04 08/16/2018   Lab Results  Component Value Date   HGB 13.1 05/29/2017    No results found for this or any previous visit (from the past 72 hour(s)). No results found.    Assessment and Plan: 36 y.o. female with    .[name] was seen today for influenza.  Diagnoses and all orders for this visit:  Influenza-like illness -     POCT Influenza A/B  Tachycardia with heart rate 100-120 beats per minute -     Ambulatory referral to Cardiology  Palpitations -     Ambulatory referral to Cardiology  PVC (premature ventricular contraction) -     Ambulatory referral to Cardiology  PAC (premature atrial contraction) -     Ambulatory referral to Cardiology  Sinus tachycardia seen on cardiac monitor   Influenza-like illness POC influenza A/B negative Afebrile, no tachypnea, mild tachycardia, pulse ox 99% on RA at  rest, no adventitious lung sounds Counseled on supportive care  Tachycardia Mild tachycardia on exam today in the setting of acute illness. Patient was concerned by this and requested a referral to Cardiology ECG performed in office today and personally reviewed by me. NSR, no arrythmia noted, normal axis Personally reviewed 48-holter showing sinus tachycardia and rare PAC/PVC Last TSH therapeutic Last Hgb normal Declines repeat labs today Referral placed to Cardiologist at patient's request Reviewed diagnosis of PVC/PAC with patient. Reassurance regarding benign nature of these arrythmias.   Patient education and anticipatory guidance given Patient agrees with treatment plan Follow-up as needed if symptoms worsen or fail to improve  Levonne Hubertharley E. Cummings PA-C

## 2018-11-16 NOTE — Patient Instructions (Addendum)
For nasal symptoms/sinusitis: - nasal saline rinses / netti pot several times per day (do this prior to nasal spray) - you can use OTC nasal decongestant sprays like Afrin (oxymetazoline) BUT do not use for more than 3 days as it will cause worsening congestion/nasal symptoms) - warm facial compresses - oral decongestants and antihistamines like Claritin-D and Zyrtec-D may help dry up secretions (caution using decongestants if you have high blood pressure, heart disease or kidney disease) - for sinus headache: Tylenol 1000mg  every 8 hours as needed. Alternate with Ibuprofen 600mg  every 6 hours  For sore throat: - Tylenol 1000mg  every 8 hours as needed for throat pain. Alternate with Ibuprofen 600mg  every 6 hours - Cepacol throat lozenges and/or Chloraseptic spray - Warm salt water gargles  Note: follow package instructions for all over-the-counter medications. If using multi-symptom medications (Dayquil, Theraflu, etc.), check the label for duplicate drug ingredients.   Viral Illness, Adult Viruses are tiny germs that can get into a person's body and cause illness. There are many different types of viruses, and they cause many types of illness. Viral illnesses can range from mild to severe. They can affect various parts of the body. Common illnesses that are caused by a virus include colds and the flu. Viral illnesses also include serious conditions such as HIV/AIDS (human immunodeficiency virus/acquired immunodeficiency syndrome). A few viruses have been linked to certain cancers. What are the causes? Many types of viruses can cause illness. Viruses invade cells in your body, multiply, and cause the infected cells to malfunction or die. When the cell dies, it releases more of the virus. When this happens, you develop symptoms of the illness, and the virus continues to spread to other cells. If the virus takes over the function of the cell, it can cause the cell to divide and grow out of control,  as is the case when a virus causes cancer. Different viruses get into the body in different ways. You can get a virus by:  Swallowing food or water that is contaminated with the virus.  Breathing in droplets that have been coughed or sneezed into the air by an infected person.  Touching a surface that has been contaminated with the virus and then touching your eyes, nose, or mouth.  Being bitten by an insect or animal that carries the virus.  Having sexual contact with a person who is infected with the virus.  Being exposed to blood or fluids that contain the virus, either through an open cut or during a transfusion.  If a virus enters your body, your body's defense system (immune system) will try to fight the virus. You may be at higher risk for a viral illness if your immune system is weak. What are the signs or symptoms? Symptoms vary depending on the type of virus and the location of the cells that it invades. Common symptoms of the main types of viral illnesses include: Cold and flu viruses  Fever.  Headache.  Sore throat.  Muscle aches.  Nasal congestion.  Cough. Digestive system (gastrointestinal) viruses  Fever.  Abdominal pain.  Nausea.  Diarrhea. Liver viruses (hepatitis)  Loss of appetite.  Tiredness.  Yellowing of the skin (jaundice). Brain and spinal cord viruses  Fever.  Headache.  Stiff neck.  Nausea and vomiting.  Confusion or sleepiness. Skin viruses  Warts.  Itching.  Rash. Sexually transmitted viruses  Discharge.  Swelling.  Redness.  Rash. How is this treated? Viruses can be difficult to treat because they live  within cells. Antibiotic medicines do not treat viruses because these drugs do not get inside cells. Treatment for a viral illness may include:  Resting and drinking plenty of fluids.  Medicines to relieve symptoms. These can include over-the-counter medicine for pain and fever, medicines for cough or  congestion, and medicines to relieve diarrhea.  Antiviral medicines. These drugs are available only for certain types of viruses. They may help reduce flu symptoms if taken early. There are also many antiviral medicines for hepatitis and HIV/AIDS.  Some viral illnesses can be prevented with vaccinations. A common example is the flu shot. Follow these instructions at home: Medicines   Take over-the-counter and prescription medicines only as told by your health care provider.  If you were prescribed an antiviral medicine, take it as told by your health care provider. Do not stop taking the medicine even if you start to feel better.  Be aware of when antibiotics are needed and when they are not needed. Antibiotics do not treat viruses. If your health care provider thinks that you may have a bacterial infection as well as a viral infection, you may get an antibiotic. ? Do not ask for an antibiotic prescription if you have been diagnosed with a viral illness. That will not make your illness go away faster. ? Frequently taking antibiotics when they are not needed can lead to antibiotic resistance. When this develops, the medicine no longer works against the bacteria that it normally fights. General instructions  Drink enough fluids to keep your urine clear or pale yellow.  Rest as much as possible.  Return to your normal activities as told by your health care provider. Ask your health care provider what activities are safe for you.  Keep all follow-up visits as told by your health care provider. This is important. How is this prevented? Take these actions to reduce your risk of viral infection:  Eat a healthy diet and get enough rest.  Wash your hands often with soap and water. This is especially important when you are in public places. If soap and water are not available, use hand sanitizer.  Avoid close contact with friends and family who have a viral illness.  If you travel to areas  where viral gastrointestinal infection is common, avoid drinking water or eating raw food.  Keep your immunizations up to date. Get a flu shot every year as told by your health care provider.  Do not share toothbrushes, nail clippers, razors, or needles with other people.  Always practice safe sex.  Contact a health care provider if:  You have symptoms of a viral illness that do not go away.  Your symptoms come back after going away.  Your symptoms get worse. Get help right away if:  You have trouble breathing.  You have a severe headache or a stiff neck.  You have severe vomiting or abdominal pain. This information is not intended to replace advice given to you by your health care provider. Make sure you discuss any questions you have with your health care provider. Document Released: 04/08/2016 Document Revised: 05/11/2016 Document Reviewed: 04/08/2016 Elsevier Interactive Patient Education  [employer].

## 2018-11-19 ENCOUNTER — Encounter: Payer: Self-pay | Admitting: Physician Assistant

## 2018-11-22 ENCOUNTER — Encounter: Payer: Self-pay | Admitting: Physician Assistant

## 2018-11-22 DIAGNOSIS — J22 Unspecified acute lower respiratory infection: Secondary | ICD-10-CM

## 2018-11-22 MED ORDER — BUDESONIDE 1 MG/2ML IN SUSP: 1.0000 mg | Freq: Two times a day (BID) | RESPIRATORY_TRACT | 0 refills | Status: DC

## 2018-11-22 MED ORDER — PREDNISONE 20 MG PO TABS: 40.0000 mg | ORAL_TABLET | Freq: Every day | ORAL | 0 refills | Status: AC

## 2018-11-23 DIAGNOSIS — G43709 Chronic migraine without aura, not intractable, without status migrainosus: Secondary | ICD-10-CM | POA: Diagnosis not present

## 2018-11-23 NOTE — Addendum Note (Signed)
Addended by: Mallie SnooksERSKINE, Briant Angelillo R on: 11/23/2018 01:46 PM   Modules accepted: Orders

## 2018-11-28 ENCOUNTER — Encounter: Payer: Self-pay | Admitting: Cardiology

## 2018-11-28 ENCOUNTER — Ambulatory Visit (INDEPENDENT_AMBULATORY_CARE_PROVIDER_SITE_OTHER): Payer: BLUE CROSS/BLUE SHIELD | Admitting: Cardiology

## 2018-11-28 VITALS — BP 116/62 | HR 103 | Ht 66.0 in | Wt 101.8 lb

## 2018-11-28 DIAGNOSIS — R002 Palpitations: Secondary | ICD-10-CM | POA: Diagnosis not present

## 2018-11-28 DIAGNOSIS — N926 Irregular menstruation, unspecified: Secondary | ICD-10-CM | POA: Insufficient documentation

## 2018-11-28 DIAGNOSIS — B372 Candidiasis of skin and nail: Secondary | ICD-10-CM | POA: Insufficient documentation

## 2018-11-28 DIAGNOSIS — R110 Nausea: Secondary | ICD-10-CM | POA: Insufficient documentation

## 2018-11-28 DIAGNOSIS — R011 Cardiac murmur, unspecified: Secondary | ICD-10-CM

## 2018-11-28 DIAGNOSIS — B373 Candidiasis of vulva and vagina: Secondary | ICD-10-CM | POA: Insufficient documentation

## 2018-11-28 DIAGNOSIS — B3731 Acute candidiasis of vulva and vagina: Secondary | ICD-10-CM | POA: Insufficient documentation

## 2018-11-28 DIAGNOSIS — O021 Missed abortion: Secondary | ICD-10-CM | POA: Insufficient documentation

## 2018-11-28 DIAGNOSIS — Z8759 Personal history of other complications of pregnancy, childbirth and the puerperium: Secondary | ICD-10-CM | POA: Insufficient documentation

## 2018-11-28 DIAGNOSIS — N912 Amenorrhea, unspecified: Secondary | ICD-10-CM | POA: Insufficient documentation

## 2018-11-28 DIAGNOSIS — K529 Noninfective gastroenteritis and colitis, unspecified: Secondary | ICD-10-CM | POA: Insufficient documentation

## 2018-11-28 DIAGNOSIS — N898 Other specified noninflammatory disorders of vagina: Secondary | ICD-10-CM | POA: Insufficient documentation

## 2018-11-28 DIAGNOSIS — O361190 Maternal care for Anti-A sensitization, unspecified trimester, not applicable or unspecified: Secondary | ICD-10-CM | POA: Insufficient documentation

## 2018-11-28 MED ORDER — METOPROLOL SUCCINATE ER 25 MG PO TB24: 25.0000 mg | ORAL_TABLET | Freq: Every day | ORAL | 1 refills | Status: DC | PRN

## 2018-11-28 NOTE — Addendum Note (Signed)
Addended by: Craige CottaANDERSON, Fauna Neuner S on: 11/28/2018 04:06 PM   Modules accepted: Orders

## 2018-11-28 NOTE — Patient Instructions (Signed)
Medication Instructions:  Your physician has recommended you make the following change in your medication:   START metoprolol succinate 25 mg as needed for palpitations  If you need a refill on your cardiac medications before your next appointment, please call your pharmacy.   Lab work: None  If you have labs (blood work) drawn today and your tests are completely normal, you will receive your results only by: Marland Kitchen. MyChart Message (if you have MyChart) OR . A paper copy in the mail If you have any lab test that is abnormal or we need to change your treatment, we will call you to review the results.  Testing/Procedures: Your physician has requested that you have an echocardiogram. Echocardiography is a painless test that uses sound waves to create images of your heart. It provides your doctor with information about the size and shape of your heart and how well your heart's chambers and valves are working. This procedure takes approximately one hour. There are no restrictions for this procedure.  Your physician has recommended that you wear an event monitor. Event monitors are medical devices that record the heart's electrical activity. Doctors most often us these monitors to diagnose arrhythmias. Arrhythmias are problems with the speed or rhythm of the heartbeat. The monitor is a small, portable device. You can wear one while you do your normal daily activities. This is usually used to diagnose what is causing palpitations/syncope (passing out).  Follow-Up: At [facility], you and your health needs are our priority.  As part of our continuing mission to provide you with exceptional heart care, we have created designated Provider Care Teams.  These Care Teams include your primary Cardiologist (physician) and Advanced Practice Providers (APPs -  Physician Assistants and Nurse Practitioners) who all work together to provide you with the care you need, when you need it.  You will need a follow up  appointment in 4 weeks.  Please call our office 2 months in advance to schedule this appointment.  You may see another member of our [facility] Provider Team in [place]: Gypsy Balsamobert Krasowski, MD . Norman HerrlichBrian Munley, MD  Any Other Special Instructions Will Be Listed Below (If Applicable).  Metoprolol extended-release capsules What is this medicine? METOPROLOL (me TOE proe lole) is a beta-blocker. Beta-blockers reduce the workload on the heart and help it to beat more regularly. This medicine is used to treat high blood pressure and to prevent chest pain. It is also used after a heart attack to prevent an additional heart attack from occurring. This medicine may be used for other purposes; ask your health care provider or pharmacist if you have questions. COMMON BRAND NAME(S): KAPSPARGO What should I tell my health care provider before I take this medicine? They need to know if you have any of these conditions: -diabetes -heart disease -liver disease -lung or breathing disease, like asthma -pheochromocytoma -thyroid disease -an unusual or allergic reaction to metoprolol, other beta-blockers, medicines, foods, dyes, or preservatives -pregnant or trying to get pregnant -breast-feeding How should I use this medicine? Take this medicine by mouth. The capsules can be swallowed whole or opened carefully and the contents sprinkled over a small amount (teaspoonful) of soft food, such as applesauce, pudding, or yogurt. This mixture must be swallowed within 60 minutes and not stored for future use. Do not chew this medicine. You can take it with or without food. If it upsets your stomach, take it with food. Take your medicine at regular intervals. Do not take it  more often than directed. Do not stop taking except on your doctor's advice. Talk to your pediatrician regarding the use of this medicine in children. While this drug may be prescribed for children as young as 6 for selected conditions, precautions  do apply. Overdosage: If you think you have taken too much of this medicine contact a poison control center or emergency room at once. NOTE: This medicine is only for you. Do not share this medicine with others. What if I miss a dose? If you miss a dose, take it as soon as you can. If it is almost time for your next dose, take only that dose. Do not take double or extra doses. What may interact with this medicine? This medicine may interact with the following medications: -certain medicines for blood pressure, heart disease, irregular heart beat -epinephrine -fluoxetine -MAOIs like Carbex, Eldepryl, Marplan, Nardil, and Parnate -paroxetine -reserpine This list may not describe all possible interactions. Give your health care provider a list of all the medicines, herbs, non-prescription drugs, or dietary supplements you use. Also tell them if you smoke, drink alcohol, or use illegal drugs. Some items may interact with your medicine. What should I watch for while using this medicine? You may get drowsy or dizzy. Do not drive, use machinery, or do anything that needs mental alertness until you know how this medicine affects you. Do not stand or sit up quickly, especially if you are an older patient. This reduces the risk of dizzy or fainting spells. Alcohol may interfere with the effect of this medicine. Avoid alcoholic drinks. Visit your doctor or health care professional for regular checks on your progress. Check your blood pressure as directed. Ask your doctor or health care professional what your blood pressure should be and when you should contact him or her. Do not treat yourself for coughs, colds, or pain while you are using this medicine without asking your doctor or health care professional for advice. Some ingredients may increase your blood pressure. What side effects may I notice from receiving this medicine? Side effects that you should report to your doctor or health care professional  as soon as possible: -allergic reactions like skin rash, itching or hives, swelling of the face, lips, or tongue -cold hands or feet -signs and symptoms of low blood pressure like dizziness; feeling faint or lightheaded, falls; unusually weak or tired -signs of worsening heart failure like breathing problems, swelling in your legs and feet -suicidal thoughts or other mood changes -unusually slow heartbeat Side effects that usually do not require medical attention (report these to your doctor or health care professional if they continue or are bothersome): -anxious -change in sex drive or performance -diarrhea -headache -trouble sleeping -tiredness -upset stomach This list may not describe all possible side effects. Call your doctor for medical advice about side effects. You may report side effects to FDA at 1-800-FDA-1088. Where should I keep my medicine? Keep out of the reach of children. Store at room temperature between 15 and 30 degrees C (59 and 86 degrees F). Throw away any unused medicine after the expiration date. NOTE: This sheet is a summary. It may not cover all possible information. If you have questions about this medicine, talk to your doctor, pharmacist, or health care provider.  2019 Elsevier/Gold Standard (2017-06-21 09:19:37)

## 2018-11-28 NOTE — Progress Notes (Signed)
Cardiology Office Note:    Date:  11/28/2018   ID:  Emily [name], DOB 07-10-1981, MRN [id number]  PCP:  Sunnie Nielsen, DO headache telemetry telemetry be a few minutes please Cardiologist:  Garwin Brothers, MD   Referring MD: Donzetta Kohut*    ASSESSMENT:    1. Palpitations    PLAN:    In order of problems listed above:  1. Primary prevention stressed with the patient.  Importance of compliance with diet and medication stressed and she vocalized understanding. 2. Her blood pressure is low and I think she has an element of possible postural hypotension or orthostatic hypotension.  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  I told her to take extra salt and fluid in the diet and she is agreeable.  I told her maneuvers such as flexing her ankles so that she can have a good venous return.  I discussed these things and I told her in the future if they do not help I would consider getting her on therapy such as midodrine.  She vocalized understanding. 3. Her thyroid testing done recently was unremarkable.  She will have a 2-week event monitor to understand her palpitations.  She knows to go to the nearest emergency room for any significant issues.  She has never had any episodes of syncope.  Cardiogram will be done to assess murmur heard on auscultation. 4. Follow-up appointment in a month or earlier if she has any concerns.   Medication Adjustments/Labs and Tests Ordered: Current medicines are reviewed at length with the patient today.  Concerns regarding medicines are outlined above.  No orders of the defined types were placed in this encounter.  No orders of the defined types were placed in this encounter.    History of Present Illness:    Emily [name] is a 37 y.o. female who is being seen today for the evaluation of palpitations at the request of Donzetta Kohut*.  Patient is a pleasant 37 year old female.  She has past medical history of  hypothyroidism on replacement thyroid.  She mentions to me that she has palpitations on and off and in general has tachycardia which has concerned her.  No chest pain orthopnea or PND.  She feels tired at times.  And this is constant.  In the past she has been told that she has PVCs.  At the time of my evaluation, the patient is alert awake oriented and in no distress.  At the time of my evaluation, the patient is alert awake oriented and in no distress.  Past Medical History:  Diagnosis Date  . Anemia    history  . Bartholin cyst 12/27/2011  . Celiac disease   . Collagenous colitis   . Delivery of twins, both live 12/02/2012  . Dichorionic diamniotic twin pregnancy 12/01/2012  . Headache(784.0)    complex migraines with cardiac and neuro symptoms during pregnancy  . Hypothyroidism   . Infertility, female   . SVD (spontaneous vaginal delivery) 12/02/2012  . Thyroid disease     Past Surgical History:  Procedure Laterality Date  . BARTHOLIN CYST MARSUPIALIZATION  12/28/2011   Procedure: BARTHOLIN CYST MARSUPIALIZATION;  Surgeon: Sherron Monday, MD;  Location: [facility];  Service: Gynecology;  Laterality: N/A;  . COLONOSCOPY    . RHINOPLASTY    . TONSILLECTOMY    . UPPER GASTROINTESTINAL ENDOSCOPY    . WISDOM TOOTH EXTRACTION      Current Medications: Current Meds  Medication Sig  . ALPRAZolam (  XANAX) 0.25 MG tablet Take 1 tablet (0.25 mg total) by mouth daily as needed for anxiety (use sparingly to avoid dependence).  . betamethasone dipropionate (DIPROLENE) 0.05 % cream betamethasone dipropionate 0.05 % topical cream  APPLY A THIN LAYER TO THE AFFECTED AREA(S) BY TOPICAL ROUTE ONCE DAILY  . budesonide (PULMICORT) 1 MG/2ML nebulizer solution Take 2 mLs (1 mg total) by nebulization 2 (two) times daily.  . Budesonide ER 9 MG TB24 Take by mouth.  . diphenoxylate-atropine (LOMOTIL) 2.5-0.025 MG tablet Take daily as needed by mouth.  . ferrous sulfate 325 (65 FE) MG EC tablet Take 1 tablet  (325 mg total) by mouth 3 (three) times daily with meals.  . gabapentin (NEURONTIN) 100 MG capsule Take 100 mg by mouth 2 (two) times daily.  Marland Kitchen ibuprofen (ADVIL,MOTRIN) 800 MG tablet Take 1 tablet (800 mg total) by mouth every 8 (eight) hours as needed.  Marland Kitchen levothyroxine (UNITHROID) 137 MCG tablet Take 1 tablet (137 mcg total) by mouth daily before breakfast.  . meclizine (ANTIVERT) 25 MG tablet Take 0.5-1 tablets (12.5-25 mg total) by mouth 3 (three) times daily as needed for dizziness.  . ondansetron (ZOFRAN) 4 MG tablet Take 4 mg by mouth as needed.     Allergies:   Rifaximin; Gluten meal; Clavulanic acid; Tessalon [benzonatate]; Xifaxan [rifaximin]; and Levothyroxine sodium   Social History   Socioeconomic History  . Marital status: Married    Spouse name: Not on file  . Number of children: Not on file  . Years of education: Not on file  . Highest education level: Not on file  Occupational History  . Not on file  Social Needs  . Financial resource strain: Not on file  . Food insecurity:    Worry: Not on file    Inability: Not on file  . Transportation needs:    Medical: Not on file    Non-medical: Not on file  Tobacco Use  . Smoking status: Never Smoker  . Smokeless tobacco: Never Used  Substance and Sexual Activity  . Alcohol use: No    Comment: socially when not pregnant  . Drug use: No  . Sexual activity: Not Currently    Birth control/protection: None  Lifestyle  . Physical activity:    Days per week: Not on file    Minutes per session: Not on file  . Stress: Not on file  Relationships  . Social connections:    Talks on phone: Not on file    Gets together: Not on file    Attends religious service: Not on file    Active member of club or organization: Not on file    Attends meetings of clubs or organizations: Not on file    Relationship status: Not on file  Other Topics Concern  . Not on file  Social History Narrative  . Not on file     Family  History: The patient's family history includes Clotting disorder in her paternal uncle; Heart attack in her maternal grandmother; Rashes / Skin problems in her paternal grandmother.  ROS:   Please see the history of present illness.    All other systems reviewed and are negative.  EKGs/Labs/Other Studies Reviewed:    The following studies were reviewed today: I discussed my findings with the patient at extensive length.     Recent Labs: 08/16/2018: TSH 1.04  Recent Lipid Panel    Component Value Date/Time   CHOL 161 05/29/2017 1537   TRIG 70 05/29/2017 1537  HDL 55 05/29/2017 1537   CHOLHDL 2.9 05/29/2017 1537   VLDL 14 05/29/2017 1537   LDLCALC 92 05/29/2017 1537    Physical Exam:    VS:  BP 116/62 (BP Location: Right Arm, Patient Position: Sitting, Cuff Size: Normal)   Pulse (!) 103   Ht 5\' 6"  (1.676 m)   Wt 101 lb 12.8 oz (46.2 kg)   SpO2 99%   BMI 16.43 kg/m     Wt Readings from Last 3 Encounters:  11/28/18 101 lb 12.8 oz (46.2 kg)  11/16/18 101 lb (45.8 kg)  11/13/18 101 lb (45.8 kg)     GEN: Patient is in no acute distress HEENT: Normal NECK: No JVD; No carotid bruits LYMPHATICS: No lymphadenopathy CARDIAC: S1 S2 regular, 2/6 systolic murmur at the apex. RESPIRATORY:  Clear to auscultation without rales, wheezing or rhonchi  ABDOMEN: Soft, non-tender, non-distended MUSCULOSKELETAL:  No edema; No deformity  SKIN: Warm and dry NEUROLOGIC:  Alert and oriented x 3 PSYCHIATRIC:  Normal affect    Signed, Garwin Brothersajan R Xavious Sharrar, MD  11/28/2018 3:54 PM    [facility]

## 2018-11-29 ENCOUNTER — Ambulatory Visit (INDEPENDENT_AMBULATORY_CARE_PROVIDER_SITE_OTHER): Payer: BLUE CROSS/BLUE SHIELD

## 2018-11-29 DIAGNOSIS — R002 Palpitations: Secondary | ICD-10-CM | POA: Diagnosis not present

## 2018-12-06 ENCOUNTER — Ambulatory Visit: Payer: BLUE CROSS/BLUE SHIELD | Admitting: Cardiology

## 2018-12-13 ENCOUNTER — Encounter: Payer: Self-pay | Admitting: Sports Medicine

## 2018-12-13 ENCOUNTER — Ambulatory Visit: Payer: BLUE CROSS/BLUE SHIELD | Admitting: Sports Medicine

## 2018-12-13 DIAGNOSIS — R110 Nausea: Secondary | ICD-10-CM | POA: Diagnosis not present

## 2018-12-13 DIAGNOSIS — G43109 Migraine with aura, not intractable, without status migrainosus: Secondary | ICD-10-CM

## 2018-12-13 MED ORDER — PROMETHAZINE HCL 25 MG/ML IJ SOLN
25.0000 mg | Freq: Once | INTRAMUSCULAR | Status: AC
  Administered 2018-12-13: 25 mg via INTRAVENOUS

## 2018-12-13 MED ORDER — RIZATRIPTAN BENZOATE 5 MG PO TBDP: 5.0000 mg | ORAL_TABLET | ORAL | 3 refills | Status: DC | PRN

## 2018-12-13 MED ORDER — PROMETHAZINE HCL 25 MG PO TABS: 25.0000 mg | ORAL_TABLET | Freq: Four times a day (QID) | ORAL | 3 refills | Status: DC | PRN

## 2018-12-13 NOTE — Progress Notes (Addendum)
Subjective:    CC: Nausea  HPI: This is a 38 year old female with a history of anxiety, for the past several days she is had increasing nausea, epigastric pain, no vomiting.  She has had a few episodes of a viral upper respiratory infection symptoms previously, she did recover from this.  Has had some episodes of tachycardia, no recent labs but she has had a normal echocardiogram, she is currently wearing a event monitor.  I reviewed the past medical history, family history, social history, surgical history, and allergies today and no changes were needed.  Please see the problem list section below in epic for further details.  Past Medical History: Past Medical History:  Diagnosis Date  . Anemia    history  . Bartholin cyst 12/27/2011  . Celiac disease   . Collagenous colitis   . Delivery of twins, both live 12/02/2012  . Dichorionic diamniotic twin pregnancy 12/01/2012  . Headache(784.0)    complex migraines with cardiac and neuro symptoms during pregnancy  . Hypothyroidism   . Infertility, female   . SVD (spontaneous vaginal delivery) 12/02/2012  . Thyroid disease    Past Surgical History: Past Surgical History:  Procedure Laterality Date  . BARTHOLIN CYST MARSUPIALIZATION  12/28/2011   Procedure: BARTHOLIN CYST MARSUPIALIZATION;  Surgeon: Sherron MondayJody Bovard, MD;  Location: [facility];  Service: Gynecology;  Laterality: N/A;  . COLONOSCOPY    . RHINOPLASTY    . TONSILLECTOMY    . UPPER GASTROINTESTINAL ENDOSCOPY    . WISDOM TOOTH EXTRACTION     Social History: Social History   Socioeconomic History  . Marital status: Married    Spouse name: Not on file  . Number of children: Not on file  . Years of education: Not on file  . Highest education level: Not on file  Occupational History  . Not on file  Social Needs  . Financial resource strain: Not on file  . Food insecurity:    Worry: Not on file    Inability: Not on file  . Transportation needs:    Medical: Not on file   Non-medical: Not on file  Tobacco Use  . Smoking status: Never Smoker  . Smokeless tobacco: Never Used  Substance and Sexual Activity  . Alcohol use: No    Comment: socially when not pregnant  . Drug use: No  . Sexual activity: Not Currently    Birth control/protection: None  Lifestyle  . Physical activity:    Days per week: Not on file    Minutes per session: Not on file  . Stress: Not on file  Relationships  . Social connections:    Talks on phone: Not on file    Gets together: Not on file    Attends religious service: Not on file    Active member of club or organization: Not on file    Attends meetings of clubs or organizations: Not on file    Relationship status: Not on file  Other Topics Concern  . Not on file  Social History Narrative  . Not on file   Family History: Family History  Problem Relation Age of Onset  . Heart attack Maternal Grandmother   . Rashes / Skin problems Paternal Grandmother   . Clotting disorder Paternal Uncle    Allergies: Allergies  Allergen Reactions  . Rifaximin Itching and Other (See Comments)    Reaction:  Itching of throat  . Gluten Meal Diarrhea, Nausea And Vomiting and Other (See Comments)    Pt has  celiac disease  . Clavulanic Acid Other (See Comments)    Flushing  . Tessalon [Benzonatate] Other (See Comments)    Lump on throat  . Xifaxan [Rifaximin]   . Levothyroxine Sodium Diarrhea    Only has GI reaction to synthroid brand   Medications: See med rec.  Review of Systems: No fevers, chills, night sweats, weight loss, chest pain, or shortness of breath.   Objective:    General: Well Developed, well nourished, and in no acute distress.  Neuro: Alert and oriented x3, extra-ocular muscles intact, sensation grossly intact.  HEENT: Normocephalic, atraumatic, pupils equal round reactive to light, neck supple, no masses, no lymphadenopathy, thyroid nonpalpable.  Skin: Warm and dry, no rashes. Cardiac: Regular rate and rhythm,  no murmurs rubs or gallops, no lower extremity edema.  Respiratory: Clear to auscultation bilaterally. Not using accessory muscles, speaking in full sentences. Abdomen: Soft, minimally tender in the epigastrium, nondistended, normal bowel sounds, no palpable masses, no guarding, rigidity, rebound tenderness.  20-gauge angiocatheter placed in the right cephalic vein, I infused 2 L of normal saline, I also diluted 25 mg of Phenergan and 10 cc of saline and infused this slowly through the side-port.  I drew her labs through her left cubital vein.  Impression and Recommendations:    Nausea With vomiting, dehydration. She has not had labs in a year and a half. History of colitis, followed by gastroenterology. 2 L normal saline, Phenergan IV. Phenergan for nausea, continue Zofran as needed. She needs a CBC, CMP, amylase, lipase. Return to see me in 1 week. She was very shaky, anxious, she has significant fear of vomiting. Ultimately to stop recurrence of these episodes we will need to control her anxiety, we could use mirtazapine to help her gain some weight and improve her anxiety and depression in the future.  Anxiety and depression Uncontrolled. Off of her sertraline, does not seem to be using Xanax. I think much of her symptoms, shakiness, fear, tachycardia is due to her uncontrolled anxiety. At future visits we will discuss mirtazapine which can control anxiety and improve her weight. I certainly think she needs a breakthrough benzodiazepine.  Complicated migraine Adding Maxalt for use as needed.  I spent 40 minutes with this patient, greater than 50% was face-to-face time counseling regarding the above diagnoses, specifically face-to-face time discussing her treatment plan as well as talking her down from an anxious state.  I spent an additional hour and a half of face-to-face time with this patient in addition to the above time used to code the 99215 office visit.     ___________________________________________ Ihor Austin. Benjamin Stain, M.D., ABFM., CAQSM. Primary Care and Sports Medicine [facility]  Adjunct Professor of Family Medicine  [employer]

## 2018-12-13 NOTE — Assessment & Plan Note (Signed)
Adding Maxalt for use as needed.

## 2018-12-13 NOTE — Addendum Note (Signed)
Addended by: Juel Burrow on: 12/13/2018 05:56 PM   Modules accepted: Orders

## 2018-12-13 NOTE — Assessment & Plan Note (Signed)
Uncontrolled. Off of her sertraline, does not seem to be using Xanax. I think much of her symptoms, shakiness, fear, tachycardia is due to her uncontrolled anxiety. At future visits we will discuss mirtazapine which can control anxiety and improve her weight. I certainly think she needs a breakthrough benzodiazepine.

## 2018-12-13 NOTE — Addendum Note (Signed)
Addended by: Monica Becton on: 12/13/2018 05:57 PM   Modules accepted: Orders

## 2018-12-13 NOTE — Assessment & Plan Note (Addendum)
With vomiting, dehydration. She has not had labs in a year and a half. History of colitis, followed by gastroenterology. 2 L normal saline, Phenergan IV. Phenergan for nausea, continue Zofran as needed. She needs a CBC, CMP, amylase, lipase. Return to see me in 1 week. She was very shaky, anxious, she has significant fear of vomiting. Ultimately to stop recurrence of these episodes we will need to control her anxiety, we could use mirtazapine to help her gain some weight and improve her anxiety and depression in the future.

## 2018-12-14 ENCOUNTER — Encounter: Payer: Self-pay | Admitting: Sports Medicine

## 2018-12-14 LAB — COMPREHENSIVE METABOLIC PANEL
AG Ratio: 2.7 (calc) — ABNORMAL HIGH (ref 1.0–2.5)
ALT: 21 U/L (ref 6–29)
Albumin: 3.8 g/dL (ref 3.6–5.1)
Alkaline phosphatase (APISO): 33 U/L (ref 33–115)
BUN: 7 mg/dL (ref 7–25)
CO2: 20 mmol/L (ref 20–32)
Chloride: 111 mmol/L — ABNORMAL HIGH (ref 98–110)
Creat: 0.63 mg/dL (ref 0.50–1.10)
Globulin: 1.4 g/dL (calc) — ABNORMAL LOW (ref 1.9–3.7)
Glucose, Bld: 91 mg/dL (ref 65–99)
Potassium: 3 mmol/L — ABNORMAL LOW (ref 3.5–5.3)
Sodium: 142 mmol/L (ref 135–146)
Total Bilirubin: 0.7 mg/dL (ref 0.2–1.2)
Total Protein: 5.2 g/dL — ABNORMAL LOW (ref 6.1–8.1)

## 2018-12-14 LAB — CBC WITH DIFFERENTIAL/PLATELET
Absolute Monocytes: 432 cells/uL (ref 200–950)
Basophils Absolute: 19 {cells}/uL (ref 0–200)
Basophils Relative: 0.4 %
Eosinophils Absolute: 89 cells/uL (ref 15–500)
Eosinophils Relative: 1.9 %
HCT: 35 % (ref 35.0–45.0)
Hemoglobin: 12.5 g/dL (ref 11.7–15.5)
Lymphs Abs: 1246 cells/uL (ref 850–3900)
MCH: 31.2 pg (ref 27.0–33.0)
MCHC: 35.7 g/dL (ref 32.0–36.0)
MCV: 87.3 fL (ref 80.0–100.0)
MPV: 11.8 fL (ref 7.5–12.5)
Monocytes Relative: 9.2 %
Neutro Abs: 2914 cells/uL (ref 1500–7800)
Neutrophils Relative %: 62 %
Platelets: 174 10*3/uL (ref 140–400)
RBC: 4.01 10*6/uL (ref 3.80–5.10)
RDW: 11.7 % (ref 11.0–15.0)
Total Lymphocyte: 26.5 %
WBC: 4.7 10*3/uL (ref 3.8–10.8)

## 2018-12-14 LAB — LIPASE: Lipase: 31 U/L (ref 7–60)

## 2018-12-14 LAB — COMPREHENSIVE METABOLIC PANEL WITH GFR
AST: 28 U/L (ref 10–30)
Calcium: 7.6 mg/dL — ABNORMAL LOW (ref 8.6–10.2)

## 2018-12-14 LAB — AMYLASE: Amylase: 44 U/L (ref 21–101)

## 2018-12-18 DIAGNOSIS — R109 Unspecified abdominal pain: Secondary | ICD-10-CM | POA: Diagnosis not present

## 2018-12-18 DIAGNOSIS — R112 Nausea with vomiting, unspecified: Secondary | ICD-10-CM | POA: Diagnosis not present

## 2018-12-19 ENCOUNTER — Encounter: Payer: Self-pay | Admitting: Osteopathic Medicine

## 2018-12-19 ENCOUNTER — Ambulatory Visit (INDEPENDENT_AMBULATORY_CARE_PROVIDER_SITE_OTHER): Payer: BLUE CROSS/BLUE SHIELD | Admitting: Osteopathic Medicine

## 2018-12-19 VITALS — BP 118/76 | HR 94 | Temp 98.6°F | Wt 94.2 lb

## 2018-12-19 DIAGNOSIS — E039 Hypothyroidism, unspecified: Secondary | ICD-10-CM | POA: Diagnosis not present

## 2018-12-19 DIAGNOSIS — R799 Abnormal finding of blood chemistry, unspecified: Secondary | ICD-10-CM

## 2018-12-19 DIAGNOSIS — R634 Abnormal weight loss: Secondary | ICD-10-CM | POA: Diagnosis not present

## 2018-12-19 DIAGNOSIS — E876 Hypokalemia: Secondary | ICD-10-CM

## 2018-12-19 NOTE — Patient Instructions (Addendum)
   Weight loss is definitely concerning, but given recent illness at least we have a good reason why it's happening. I expect it will improve as we get you eating better, but this may take time.   I'd follow GI recommendations re: diet and eating and contact them as directed toward the end of the week with any updates.   Stay hydrated of course, take medications as directed by GI. Would consider medications for anxiety and palpaitatoins, but I understand your reluctance to start more pills given your current situation.   If you want to get IV fluid, please call our office and this can be arranged.

## 2018-12-19 NOTE — Progress Notes (Signed)
HPI: Emily [name] is a 38 y.o. female who  has a past medical history of Anemia, Bartholin cyst (12/27/2011), Celiac disease, Collagenous colitis, Delivery of twins, both live (12/02/2012), Dichorionic diamniotic twin pregnancy (12/01/2012), Headache(784.0), Hypothyroidism, Infertility, female, SVD (spontaneous vaginal delivery) (12/02/2012), and Thyroid disease.  [facility], 12/19/18,  for chief complaint of:  Fatigue, weight loss, low appetite   Patient reports significant fatigue, weight loss, dereased appetite.  Persistent nausea.    She saw gastroenterology yesterday,  12/18/2018, notes were reviewed.  Reported about 2 weeks prior, had "stomach virus" and reported normal bowel movements, no constipation, diarrhea, blood in the stool.  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  Prescribed sucralfate 1 g twice daily, advised no NSAIDs, refilled Zofran 8 mg twice daily as needed, was given gastroparesis diet guidelines, advised to trial carnation instant breakfast, small frequent meals, minimizing greasy foods, raw fruits/vegetables.  Was asked to call at the end of the week with a symptom update.    Also saw a colleague of mine on 12/13/2018 while I was out of the office, this was 6 days ago, for similar issues. 2L IV fluids given as well as Phenergan IV, which helped symptoms. Labs showed hypokalemia, low globulin, indicating malnutrition.  Dr. Karie Schwalbe mentioned possibly trialing Remeron and strong consideration for breakthrough benzodiazepine.  Patient had been off of her sertraline, anxiety is evident on exam.   Today reports concern over weight loss and malnutrition, worried she'll end up in the hospital.   Wt Readings from Last 3 Encounters:  12/19/18 94 lb 3.2 oz (42.7 kg)  12/13/18 96 lb (43.5 kg)  11/28/18 101 lb 12.8 oz (46.2 kg)     At today's visit... Past medical history, surgical history, and family history reviewed  and updated as needed.  Current medication list and allergy/intolerance information reviewed and updated as needed. (See remainder of HPI, ROS, Phys Exam below)   Recent Results (from the past 2160 hour(s))  POCT Influenza A/B     Status: Normal   Collection Time: 11/16/18  2:43 PM  Result Value Ref Range   Influenza A, POC Negative Negative   Influenza B, POC Negative Negative  CBC with Differential/Platelet     Status: None   Collection Time: 12/13/18  3:36 PM  Result Value Ref Range   WBC 4.7 3.8 - 10.8 Thousand/uL   RBC 4.01 3.80 - 5.10 Million/uL   Hemoglobin 12.5 11.7 - 15.5 g/dL   HCT 16.135.0 09.635.0 - 04.545.0 %   MCV 87.3 80.0 - 100.0 fL   MCH 31.2 27.0 - 33.0 pg   MCHC 35.7 32.0 - 36.0 g/dL   RDW 40.911.7 81.111.0 - 91.415.0 %   Platelets 174 140 - 400 Thousand/uL   MPV 11.8 7.5 - 12.5 fL   Neutro Abs 2,914 1,500 - 7,800 cells/uL   Lymphs Abs 1,246 850 - 3,900 cells/uL   Absolute Monocytes 432 200 - 950 cells/uL   Eosinophils Absolute 89 15 - 500 cells/uL   Basophils Absolute 19 0 - 200 cells/uL   Neutrophils Relative % 62 %   Total Lymphocyte 26.5 %   Monocytes Relative 9.2 %   Eosinophils Relative 1.9 %   Basophils Relative 0.4 %  Comprehensive metabolic panel     Status: Abnormal   Collection Time: 12/13/18  3:36 PM  Result Value Ref Range   Glucose, Bld 91 65 - 99 mg/dL    Comment: .  Fasting reference interval .    BUN 7 7 - 25 mg/dL   Creat 1.610.63 0.960.50 - 0.451.10 mg/dL   BUN/Creatinine Ratio NOT APPLICABLE 6 - 22 (calc)   Sodium 142 135 - 146 mmol/L   Potassium 3.0 (L) 3.5 - 5.3 mmol/L   Chloride 111 (H) 98 - 110 mmol/L   CO2 20 20 - 32 mmol/L   Calcium 7.6 (L) 8.6 - 10.2 mg/dL   Total Protein 5.2 (L) 6.1 - 8.1 g/dL   Albumin 3.8 3.6 - 5.1 g/dL   Globulin 1.4 (L) 1.9 - 3.7 g/dL (calc)   AG Ratio 2.7 (H) 1.0 - 2.5 (calc)   Total Bilirubin 0.7 0.2 - 1.2 mg/dL   Alkaline phosphatase (APISO) 33 33 - 115 U/L   AST 28 10 - 30 U/L   ALT 21 6 - 29 U/L  Amylase      Status: None   Collection Time: 12/13/18  3:36 PM  Result Value Ref Range   Amylase 44 21 - 101 U/L  Lipase     Status: None   Collection Time: 12/13/18  3:36 PM  Result Value Ref Range   Lipase 31 7 - 60 U/L          ASSESSMENT/PLAN: The primary encounter diagnosis was Weight loss, abnormal. Diagnoses of Hypothyroidism, unspecified type, Hypokalemia, and Abnormal serum total protein level were also pertinent to this visit.   Orders Placed This Encounter  Procedures  . TSH  . T4, free  . COMPLETE METABOLIC PANEL WITH GFR     Patient Instructions   Weight loss is definitely concerning, but given recent illness at least we have a good reason why it's happening. I expect it will improve as we get you eating better, but this may take time.   I'd follow GI recommendations re: diet and eating and contact them as directed toward the end of the week with any updates.   Stay hydrated of course, take medications as directed by GI. Would consider medications for anxiety and palpaitatoins, but I understand your reluctance to start more pills given your current situation.   If you want to get IV fluid, please call our office and this can be arranged.      Follow-up plan: Return for recheck weight and GI symptoms in 1-2 weeks or as directed by gastroenterologist .                             ############################################ ############################################ ############################################ ############################################    No outpatient medications have been marked as taking for the 12/19/18 encounter (Appointment) with Sunnie NielsenAlexander, Keondra Haydu, DO.    Allergies  Allergen Reactions  . Rifaximin Itching and Other (See Comments)    Reaction:  Itching of throat  . Gluten Meal Diarrhea, Nausea And Vomiting and Other (See Comments)    Pt has celiac disease  . Clavulanic Acid Other (See Comments)    Flushing   . Tessalon [Benzonatate] Other (See Comments)    Lump on throat  . Xifaxan [Rifaximin]   . Levothyroxine Sodium Diarrhea    Only has GI reaction to synthroid brand       Review of Systems:  Constitutional:+recent illness, +fatigue, +weight loss  HEENT: +headache, no vision change, +palpitations  Cardiac: No  chest pain, No  pressure, No palpitations  Respiratory:  No  shortness of breath. No  Cough  Gastrointestinal: +abdominal pain, no change on bowel habits  Musculoskeletal: No new  myalgia/arthralgia  Skin: No  Rash  Neurologic: +generalized weakness, +occsaional Dizziness  Psychiatric: No  concerns with depression, +concerns with anxiety  Exam:  BP 118/76 (BP Location: Left Arm, Patient Position: Sitting, Cuff Size: Normal)   Pulse 94   Temp 98.6 F (37 C) (Oral)   Wt 94 lb 3.2 oz (42.7 kg)   BMI 15.20 kg/m   Constitutional: VS see above. General Appearance: alert, thin, NAD  Eyes: Normal lids and conjunctive, non-icteric sclera  Ears, Nose, Mouth, Throat: MMM, Normal external inspection ears/nares/mouth/lips/gums.  Neck: No masses, trachea midline.   Respiratory: Normal respiratory effort. no wheeze, no rhonchi, no rales  Cardiovascular: S1/S2 normal, no murmur, no rub/gallop auscultated. RRR.   Musculoskeletal: Gait normal. Symmetric and independent movement of all extremities  Neurological: Normal balance/coordination. No tremor.  Skin: warm, dry, intact.   Psychiatric: Normal judgment/insight. Anxious mood and affect. Oriented x3.       Visit summary with medication list and pertinent instructions was printed for patient to review, patient was advised to alert [place] if any updates are needed. All questions at time of visit were answered - patient instructed to contact office with any additional concerns. ER/RTC precautions were reviewed with the patient and understanding verbalized.     Please note: voice recognition software was used to produce  this document, and typos may escape review. Please contact Dr. Lyn Hollingshead for any needed clarifications.    Follow up plan: Return for recheck weight and GI symptoms in 1-2 weeks or as directed by gastroenterologist .

## 2018-12-20 ENCOUNTER — Encounter: Payer: Self-pay | Admitting: Osteopathic Medicine

## 2018-12-20 DIAGNOSIS — R002 Palpitations: Secondary | ICD-10-CM | POA: Diagnosis not present

## 2018-12-20 LAB — COMPLETE METABOLIC PANEL WITH GFR
AG Ratio: 2.3 (calc) (ref 1.0–2.5)
ALT: 25 U/L (ref 6–29)
AST: 18 U/L (ref 10–30)
Albumin: 4.6 g/dL (ref 3.6–5.1)
Alkaline phosphatase (APISO): 39 U/L (ref 33–115)
BUN: 7 mg/dL (ref 7–25)
CO2: 28 mmol/L (ref 20–32)
Calcium: 9.5 mg/dL (ref 8.6–10.2)
Chloride: 106 mmol/L (ref 98–110)
Creat: 0.69 mg/dL (ref 0.50–1.10)
GFR, EST NON AFRICAN AMERICAN: 113 mL/min/{1.73_m2} (ref >=60)
GFR, Est African American: 131 mL/min/{1.73_m2} (ref >=60)
Globulin: 2 g/dL (calc) (ref 1.9–3.7)
Glucose, Bld: 94 mg/dL (ref 65–99)
Potassium: 4 mmol/L (ref 3.5–5.3)
Sodium: 142 mmol/L (ref 135–146)
Total Bilirubin: 0.4 mg/dL (ref 0.2–1.2)
Total Protein: 6.6 g/dL (ref 6.1–8.1)

## 2018-12-20 LAB — TSH: TSH: 24.86 m[IU]/L — AB

## 2018-12-20 LAB — T4, FREE: Free T4: 1 ng/dL (ref 0.8–1.8)

## 2018-12-21 ENCOUNTER — Ambulatory Visit: Payer: BLUE CROSS/BLUE SHIELD | Admitting: Sports Medicine

## 2018-12-21 ENCOUNTER — Encounter: Payer: Self-pay | Admitting: Osteopathic Medicine

## 2018-12-21 ENCOUNTER — Ambulatory Visit (INDEPENDENT_AMBULATORY_CARE_PROVIDER_SITE_OTHER): Payer: BLUE CROSS/BLUE SHIELD | Admitting: Osteopathic Medicine

## 2018-12-21 VITALS — BP 125/86 | HR 92 | Temp 98.3°F | Wt 93.3 lb

## 2018-12-21 DIAGNOSIS — R634 Abnormal weight loss: Secondary | ICD-10-CM

## 2018-12-21 DIAGNOSIS — K3189 Other diseases of stomach and duodenum: Secondary | ICD-10-CM

## 2018-12-21 DIAGNOSIS — F411 Generalized anxiety disorder: Secondary | ICD-10-CM | POA: Diagnosis not present

## 2018-12-21 DIAGNOSIS — R0989 Other specified symptoms and signs involving the circulatory and respiratory systems: Secondary | ICD-10-CM | POA: Diagnosis not present

## 2018-12-21 DIAGNOSIS — R198 Other specified symptoms and signs involving the digestive system and abdomen: Secondary | ICD-10-CM

## 2018-12-21 DIAGNOSIS — K3184 Gastroparesis: Secondary | ICD-10-CM

## 2018-12-21 NOTE — Progress Notes (Signed)
HPI: Emily [name] is a 38 y.o. female who  has a past medical history of Anemia, Bartholin cyst (12/27/2011), Celiac disease, Collagenous colitis, Delivery of twins, both live (12/02/2012), Dichorionic diamniotic twin pregnancy (12/01/2012), Headache(784.0), Hypothyroidism, Infertility, female, SVD (spontaneous vaginal delivery) (12/02/2012), and Thyroid disease.  she presents to [facility] today, 12/21/18,  for chief complaint of:  Abdominal pain, nausea, difficulty swallowing, weight loss  See notes from 2 days ago.  Recent dx postviral gastroparesis Pt has significant anxiety around swallowing difficulty and weight loss. She has not contacted GI office today. She reports persistent trouble eating due to nausea and fear of vomiting, new today is feeling of "something in my throat" like "swollen glands" pointing to just under mandible.   She's taking the thyroid medicine.  Unable/unwilling to take other medicines.  Has been on multiple meds fr anxiety. She worries about side effects from starting something new.   Wt Readings from Last 3 Encounters:  12/21/18 93 lb 4.8 oz (42.3 kg)  12/19/18 94 lb 3.2 oz (42.7 kg)  12/13/18 96 lb (43.5 kg)       At today's visit... Past medical history, surgical history, and family history reviewed and updated as needed.  Current medication list and allergy/intolerance information reviewed and updated as needed. (See remainder of HPI, ROS, Phys Exam below)   No results found.  Results for orders placed or performed in visit on 12/19/18 (from the past 72 hour(s))  TSH     Status: Abnormal   Collection Time: 12/19/18  1:57 PM  Result Value Ref Range   TSH 24.86 (H) mIU/L    Comment:           Reference Range .           > or = 20 Years  0.40-4.50 .                Pregnancy Ranges           First trimester    0.26-2.66           Second trimester   0.55-2.73           Third trimester    0.43-2.91   T4,  free     Status: None   Collection Time: 12/19/18  1:57 PM  Result Value Ref Range   Free T4 1.0 0.8 - 1.8 ng/dL  COMPLETE METABOLIC PANEL WITH GFR     Status: None   Collection Time: 12/19/18  1:57 PM  Result Value Ref Range   Glucose, Bld 94 65 - 99 mg/dL    Comment: .            Fasting reference interval .    BUN 7 7 - 25 mg/dL   Creat 1.61 0.96 - 0.45 mg/dL   GFR, Est Non African American 113 > OR = 60 mL/min/1.68m2   GFR, Est African American 131 > OR = 60 mL/min/1.24m2   BUN/Creatinine Ratio NOT APPLICABLE 6 - 22 (calc)   Sodium 142 135 - 146 mmol/L   Potassium 4.0 3.5 - 5.3 mmol/L   Chloride 106 98 - 110 mmol/L   CO2 28 20 - 32 mmol/L   Calcium 9.5 8.6 - 10.2 mg/dL   Total Protein 6.6 6.1 - 8.1 g/dL   Albumin 4.6 3.6 - 5.1 g/dL   Globulin 2.0 1.9 - 3.7 g/dL (calc)   AG Ratio 2.3 1.0 - 2.5 (calc)   Total Bilirubin 0.4 0.2 -  1.2 mg/dL   Alkaline phosphatase (APISO) 39 33 - 115 U/L   AST 18 10 - 30 U/L   ALT 25 6 - 29 U/L          ASSESSMENT/PLAN: The primary encounter diagnosis was Weight loss, abnormal. Diagnoses of Postviral gastroparesis, Globus sensation, and Anxiety state were also pertinent to this visit.   Long chat with the patient about options  I think the anxiety is certainly worsening the underlying illness and needs to be addressed. She refuses medications.   I told her then at this point I am frankly out of ideas and would like to discuss further with GI.   I'm concerned about weight loss and malnourishment. I'm not so familiar with criteria for hospital admission for such issues, or whether heck at this point let's try a G-tube. I left a message w/ [facility] for her physician there to call me on my cell so we can discuss her case and come up with a plan. Patient was appreciative. Will await a call back      Follow-up plan: Return for recheck based on GI recs  .                             ############################################ ############################################ ############################################ ############################################    Current Meds  Medication Sig  . ALPRAZolam (XANAX) 0.25 MG tablet Take 1 tablet (0.25 mg total) by mouth daily as needed for anxiety (use sparingly to avoid dependence).  . betamethasone dipropionate (DIPROLENE) 0.05 % cream betamethasone dipropionate 0.05 % topical cream  APPLY A THIN LAYER TO THE AFFECTED AREA(S) BY TOPICAL ROUTE ONCE DAILY  . budesonide (PULMICORT) 1 MG/2ML nebulizer solution Take 2 mLs (1 mg total) by nebulization 2 (two) times daily.  . Budesonide ER 9 MG TB24 Take by mouth.  . diphenoxylate-atropine (LOMOTIL) 2.5-0.025 MG tablet Take daily as needed by mouth.  . ferrous sulfate 325 (65 FE) MG EC tablet Take 1 tablet (325 mg total) by mouth 3 (three) times daily with meals.  . gabapentin (NEURONTIN) 100 MG capsule Take 100 mg by mouth 2 (two) times daily.  Marland Kitchen ibuprofen (ADVIL,MOTRIN) 800 MG tablet Take 1 tablet (800 mg total) by mouth every 8 (eight) hours as needed.  Marland Kitchen levothyroxine (UNITHROID) 137 MCG tablet Take 1 tablet (137 mcg total) by mouth daily before breakfast.  . meclizine (ANTIVERT) 25 MG tablet Take 0.5-1 tablets (12.5-25 mg total) by mouth 3 (three) times daily as needed for dizziness.  . metoprolol succinate (TOPROL-XL) 25 MG 24 hr tablet Take 1 tablet (25 mg total) by mouth daily as needed (for palpitations).  . ondansetron (ZOFRAN) 4 MG tablet Take 4 mg by mouth as needed.  . promethazine (PHENERGAN) 25 MG tablet Take 1 tablet (25 mg total) by mouth every 6 (six) hours as needed for nausea.  . rizatriptan (MAXALT-MLT) 5 MG disintegrating tablet Take 1 tablet (5 mg total) by mouth as needed for migraine. May repeat in 2 hours if needed    Allergies  Allergen Reactions  . Rifaximin Itching and Other (See Comments)     Reaction:  Itching of throat  . Gluten Meal Diarrhea, Nausea And Vomiting and Other (See Comments)    Pt has celiac disease  . Clavulanic Acid Other (See Comments)    Flushing  . Tessalon [Benzonatate] Other (See Comments)    Lump on throat  . Xifaxan [Rifaximin]   . Levothyroxine Sodium Diarrhea  Only has GI reaction to synthroid brand       Review of Systems:  Constitutional: +recent illness  Neurologic: +weakness, No  Dizziness  Psychiatric: +concerns with depression, +concerns with anxiety  Exam:  BP 125/86 (BP Location: Left Arm, Patient Position: Sitting, Cuff Size: Normal)   Pulse 92   Temp 98.3 F (36.8 C) (Oral)   Wt 93 lb 4.8 oz (42.3 kg)   BMI 15.06 kg/m   Constitutional: VS see above. General Appearance: alert, well-developed, well-nourished, NAD  Neurological: Normal balance/coordination. No tremor.  Skin: warm, dry, intact.   Psychiatric: Fair judgment/insight. Anxious and tearful mood and affect. Oriented x3.       Visit summary with medication list and pertinent instructions was printed for patient to review, patient was advised to alert us if any updates are needed. All questions at time of visit were answered - patient instructed to contact office with any additional concerns. ER/RTC precautions were reviewed with the patient and understanding verbalized.   Note: Total time spent 25 minutes, greater than 50% of the visit was spent face-to-face counseling and coordinating care for the following: The primary encounter diagnosis was Weight loss, abnormal. Diagnoses of Postviral gastroparesis, Globus sensation, and Anxiety state were also pertinent to this visit.Marland Kitchen.  Please note: voice recognition software was used to produce this document, and typos may escape review. Please contact Dr. Lyn HollingsheadAlexander for any needed clarifications.    Follow up plan: Return for recheck based on GI recs .

## 2018-12-22 ENCOUNTER — Encounter: Payer: Self-pay | Admitting: Osteopathic Medicine

## 2018-12-27 ENCOUNTER — Other Ambulatory Visit: Payer: Self-pay | Admitting: Physician Assistant

## 2018-12-27 DIAGNOSIS — J22 Unspecified acute lower respiratory infection: Secondary | ICD-10-CM

## 2019-01-27 DIAGNOSIS — R000 Tachycardia, unspecified: Secondary | ICD-10-CM | POA: Diagnosis not present

## 2019-01-27 DIAGNOSIS — R5383 Other fatigue: Secondary | ICD-10-CM | POA: Diagnosis not present

## 2019-01-27 DIAGNOSIS — Z3202 Encounter for pregnancy test, result negative: Secondary | ICD-10-CM | POA: Diagnosis not present

## 2019-01-27 DIAGNOSIS — R42 Dizziness and giddiness: Secondary | ICD-10-CM | POA: Diagnosis not present

## 2019-01-27 DIAGNOSIS — R110 Nausea: Secondary | ICD-10-CM | POA: Diagnosis not present

## 2019-01-27 DIAGNOSIS — R109 Unspecified abdominal pain: Secondary | ICD-10-CM | POA: Diagnosis not present

## 2019-01-28 DIAGNOSIS — I4510 Unspecified right bundle-branch block: Secondary | ICD-10-CM | POA: Diagnosis not present

## 2019-02-06 DIAGNOSIS — G43709 Chronic migraine without aura, not intractable, without status migrainosus: Secondary | ICD-10-CM | POA: Diagnosis not present

## 2019-02-06 DIAGNOSIS — Z681 Body mass index (BMI) 19 or less, adult: Secondary | ICD-10-CM | POA: Diagnosis not present

## 2019-02-06 DIAGNOSIS — G8929 Other chronic pain: Secondary | ICD-10-CM | POA: Diagnosis not present

## 2019-02-06 DIAGNOSIS — K52839 Microscopic colitis, unspecified: Secondary | ICD-10-CM | POA: Diagnosis not present

## 2019-02-06 DIAGNOSIS — R634 Abnormal weight loss: Secondary | ICD-10-CM | POA: Diagnosis not present

## 2019-02-06 DIAGNOSIS — R109 Unspecified abdominal pain: Secondary | ICD-10-CM | POA: Diagnosis not present

## 2019-02-06 DIAGNOSIS — K900 Celiac disease: Secondary | ICD-10-CM | POA: Diagnosis not present

## 2019-02-06 DIAGNOSIS — R110 Nausea: Secondary | ICD-10-CM | POA: Diagnosis not present

## 2019-02-08 ENCOUNTER — Telehealth: Payer: Self-pay | Admitting: Osteopathic Medicine

## 2019-02-08 NOTE — Telephone Encounter (Signed)
Attempted to contact the office. No answer, unable to leave a vm msg. Will attempt to contact the office on Monday during regular office hours between 8 am to 5 pm.

## 2019-02-08 NOTE — Telephone Encounter (Signed)
I got an unusual fax from [facility] gastroenterology, she sees Dr. Willis Modena there.    Requested for lab order, "please draw and have patient follow-up with our office with results" there is an attached prescription for several labs.    Please call this office at [phone] and let them know that we do not draw labs in this office, labs to be ordered through an appropriate lab facility and results forwarded to the ordering physician

## 2019-02-11 NOTE — Telephone Encounter (Signed)
It's inappropriate for other offices to ask [place] to be responsible for ordering their labs. Please call back and ask that they order these tests themselves. I know this patient has a complicated GI history and I am not will to be responsible or liable for labs if something is seriously abnormal.

## 2019-02-11 NOTE — Telephone Encounter (Signed)
Called GI office. They did not want PCP to order, the Pt just wants labs to be done here so they needed our lab information. Provided them with [facility] information and fax number. They will place orders and sent to [facility] so Pt can complete.

## 2019-02-11 NOTE — Telephone Encounter (Signed)
Spoke to [place] at [facility]. As per the nurse, pt requested for labs to be ordered by primary provider, which is why an lab order was sent with the fax. GI doctor is requesting that lab results be forwarded to the office once received. Pls advise, thanks.

## 2019-02-16 ENCOUNTER — Ambulatory Visit (HOSPITAL_BASED_OUTPATIENT_CLINIC_OR_DEPARTMENT_OTHER)
Admission: RE | Admit: 2019-02-16 | Discharge: 2019-02-16 | Disposition: A | Discharge status: Home / Self Care | Payer: BLUE CROSS/BLUE SHIELD | Source: Ambulatory Visit | Attending: Osteopathic Medicine | Admitting: Osteopathic Medicine

## 2019-02-16 ENCOUNTER — Emergency Department
Admission: EM | Admit: 2019-02-16 | Discharge: 2019-02-16 | Disposition: A | Discharge status: Home / Self Care | Payer: BLUE CROSS/BLUE SHIELD | Source: Home / Self Care

## 2019-02-16 ENCOUNTER — Emergency Department (INDEPENDENT_AMBULATORY_CARE_PROVIDER_SITE_OTHER): Payer: BLUE CROSS/BLUE SHIELD

## 2019-02-16 ENCOUNTER — Encounter: Payer: Self-pay | Admitting: Emergency Medicine

## 2019-02-16 DIAGNOSIS — M549 Dorsalgia, unspecified: Secondary | ICD-10-CM | POA: Diagnosis not present

## 2019-02-16 DIAGNOSIS — R1011 Right upper quadrant pain: Secondary | ICD-10-CM | POA: Insufficient documentation

## 2019-02-16 DIAGNOSIS — R110 Nausea: Secondary | ICD-10-CM

## 2019-02-16 DIAGNOSIS — R109 Unspecified abdominal pain: Secondary | ICD-10-CM

## 2019-02-16 DIAGNOSIS — N1339 Other hydronephrosis: Secondary | ICD-10-CM | POA: Diagnosis not present

## 2019-02-16 DIAGNOSIS — N2889 Other specified disorders of kidney and ureter: Secondary | ICD-10-CM | POA: Diagnosis not present

## 2019-02-16 LAB — POCT URINE PREGNANCY: Preg Test, Ur: NEGATIVE

## 2019-02-16 LAB — POCT URINALYSIS DIP (MANUAL ENTRY)
Bilirubin, UA: NEGATIVE
Blood, UA: NEGATIVE
Glucose, UA: NEGATIVE mg/dL
Ketones, POC UA: NEGATIVE mg/dL
Leukocytes, UA: NEGATIVE
Nitrite, UA: NEGATIVE
Protein Ur, POC: NEGATIVE mg/dL
Spec Grav, UA: 1.015 (ref 1.010–1.025)
Urobilinogen, UA: 0.2 E.U./dL
pH, UA: 8.5 — AB (ref 5.0–8.0)

## 2019-02-16 LAB — COMPLETE METABOLIC PANEL WITH GFR
AG Ratio: 2.8 (calc) — ABNORMAL HIGH (ref 1.0–2.5)
ALT: 19 U/L (ref 6–29)
AST: 18 U/L (ref 10–30)
Albumin: 4.2 g/dL (ref 3.6–5.1)
Alkaline phosphatase (APISO): 31 U/L (ref 31–125)
BUN: 8 mg/dL (ref 7–25)
CO2: 28 mmol/L (ref 20–32)
Calcium: 8.7 mg/dL (ref 8.6–10.2)
Chloride: 107 mmol/L (ref 98–110)
Creat: 0.57 mg/dL (ref 0.50–1.10)
GFR, Est African American: 139 mL/min/{1.73_m2} (ref >=60)
GFR, Est Non African American: 120 mL/min/{1.73_m2} (ref >=60)
Globulin: 1.5 g/dL (calc) — ABNORMAL LOW (ref 1.9–3.7)
Glucose, Bld: 89 mg/dL (ref 65–99)
Potassium: 4.3 mmol/L (ref 3.5–5.3)
Sodium: 140 mmol/L (ref 135–146)
Total Bilirubin: 0.6 mg/dL (ref 0.2–1.2)
Total Protein: 5.7 g/dL — ABNORMAL LOW (ref 6.1–8.1)

## 2019-02-16 LAB — POCT CBC W AUTO DIFF (K'VILLE URGENT CARE)

## 2019-02-16 MED ORDER — HYDROCODONE-ACETAMINOPHEN 5-325 MG PO TABS: 2.0000 | ORAL_TABLET | Freq: Four times a day (QID) | ORAL | 0 refills | Status: DC | PRN

## 2019-02-16 MED ORDER — ACETAMINOPHEN 325 MG PO TABS
650.0000 mg | ORAL_TABLET | Freq: Once | ORAL | Status: AC
  Administered 2019-02-16: 650 mg via ORAL

## 2019-02-16 NOTE — Discharge Instructions (Addendum)
°  The source of your pain has not been determined yet.    Please go to [facility] today for an ultrasound to help determine if your gallbladder or a kidney stone is the source of the pain.  If your pain worsens or started to go into your Right lower abdomen, you develop fever or vomiting, or other new concerning symptoms have developed, please go to the closest emergency department for further evaluation and treatment.

## 2019-02-16 NOTE — ED Triage Notes (Signed)
Patient c/o right sided low back pain that started today, no urinary sx's, no history of kidney stones.

## 2019-02-16 NOTE — ED Provider Notes (Signed)
[name]    CSN: [id number] Arrival date & time: 02/16/19  0901     History   Chief Complaint Chief Complaint  Patient presents with  . Back Pain    HPI Emily [name] is a 38 y.o. female.   HPI Emily [name] is a 38 y.o. female presenting to UC with c/o Right flank pain that started yesterday but became severe this morning around 8AM after eatting breakfast at 730AM. Pt had eggs for breakfast but states she did not eat anything yesterday just prior to the pain starting. Pain is aching and sharp. Nothing seems to make it better or worse.  No hx of kidney stones. Denies dysuria or hematuria. No fever, chills vomiting or diarrhea but she has had some nausea. She took zofran PTA, which did help the nausea. No hx of abdominal surgeries.    Past Medical History:  Diagnosis Date  . Anemia    history  . Bartholin cyst 12/27/2011  . Celiac disease   . Collagenous colitis   . Delivery of twins, both live 12/02/2012  . Dichorionic diamniotic twin pregnancy 12/01/2012  . Headache(784.0)    complex migraines with cardiac and neuro symptoms during pregnancy  . Hypothyroidism   . Infertility, female   . SVD (spontaneous vaginal delivery) 12/02/2012  . Thyroid disease     Patient Active Problem List   Diagnosis Date Noted  . Amenorrhea 11/28/2018  . Candidiasis of vagina 11/28/2018  . Candidiasis of skin 11/28/2018  . Chronic colitis 11/28/2018  . History of miscarriage 11/28/2018  . Irregular periods 11/28/2018  . Missed abortion 11/28/2018  . Nausea 11/28/2018  . Pregnancy with isoimmunization 11/28/2018  . Vaginal discharge 11/28/2018  . Palpitations 11/16/2018  . PVC (premature ventricular contraction) 11/16/2018  . PAC (premature atrial contraction) 11/16/2018  . Sinus tachycardia seen on cardiac monitor 04/06/2018  . History of colonoscopy 03/31/2016  . Cervical cancer screening 03/31/2016  . History of celiac disease 03/31/2016  . Thyroid activity  decreased 03/31/2016  . Anxiety and depression 03/31/2016  . History of anemia 03/31/2016  . Lipid screening 03/31/2016  . Medication management 03/31/2016  . Encounter for contraceptive management 03/31/2016  . Tachycardia with heart rate 100-120 beats per minute 11/13/2012  . Hypokalemia 11/13/2012  . Disturbance of skin sensation 10/07/2012  . Complicated migraine 10/07/2012  . Bartholin cyst 12/27/2011  . Female infertility 10/24/2011    Past Surgical History:  Procedure Laterality Date  . BARTHOLIN CYST MARSUPIALIZATION  12/28/2011   Procedure: BARTHOLIN CYST MARSUPIALIZATION;  Surgeon: Sherron Monday, MD;  Location: [facility];  Service: Gynecology;  Laterality: N/A;  . COLONOSCOPY    . RHINOPLASTY    . TONSILLECTOMY    . UPPER GASTROINTESTINAL ENDOSCOPY    . WISDOM TOOTH EXTRACTION      OB History    Gravida  3   Para  2   Term  2   Preterm      AB      Living  3     SAB      TAB      Ectopic      Multiple  1   Live Births  3            Home Medications    Prior to Admission medications   Medication Sig Start Date End Date Taking? Authorizing Provider  ALPRAZolam (XANAX) 0.25 MG tablet Take 1 tablet (0.25 mg total) by mouth daily as needed for anxiety (use sparingly to  avoid dependence). 03/23/18   Sunnie Nielsen, DO  betamethasone dipropionate (DIPROLENE) 0.05 % cream betamethasone dipropionate 0.05 % topical cream  APPLY A THIN LAYER TO THE AFFECTED AREA(S) BY TOPICAL ROUTE ONCE DAILY 10/16/17   Carlis Stable, PA-C  budesonide (PULMICORT) 1 MG/2ML nebulizer solution Take 2 mLs (1 mg total) by nebulization 2 (two) times daily. 11/22/18   Carlis Stable, PA-C  Budesonide ER 9 MG TB24 Take by mouth.    [provider]  diphenoxylate-atropine (LOMOTIL) 2.5-0.025 MG tablet Take daily as needed by mouth.    [provider]  ferrous sulfate 325 (65 FE) MG EC tablet Take 1 tablet (325 mg total) by mouth 3 (three)  times daily with meals. 05/31/17   Sunnie Nielsen, DO  gabapentin (NEURONTIN) 100 MG capsule Take 100 mg by mouth 2 (two) times daily.    [provider]  HYDROcodone-acetaminophen (NORCO/VICODIN) 5-325 MG tablet Take 2 tablets by mouth every 6 (six) hours as needed. 02/16/19   Lurene Shadow, PA-C  ibuprofen (ADVIL,MOTRIN) 800 MG tablet Take 1 tablet (800 mg total) by mouth every 8 (eight) hours as needed. 01/09/16   Bovard-Stuckert, Augusto Gamble, MD  levothyroxine (UNITHROID) 137 MCG tablet Take 1 tablet (137 mcg total) by mouth daily before breakfast. 08/20/18   Sunnie Nielsen, DO  meclizine (ANTIVERT) 25 MG tablet Take 0.5-1 tablets (12.5-25 mg total) by mouth 3 (three) times daily as needed for dizziness. 03/26/18   Sunnie Nielsen, DO  metoprolol succinate (TOPROL-XL) 25 MG 24 hr tablet Take 1 tablet (25 mg total) by mouth daily as needed (for palpitations). 11/28/18   Revankar, Aundra Dubin, MD  ondansetron (ZOFRAN) 4 MG tablet Take 4 mg by mouth as needed. 06/04/10   [provider]  promethazine (PHENERGAN) 25 MG tablet Take 1 tablet (25 mg total) by mouth every 6 (six) hours as needed for nausea. 12/13/18   Monica Becton, MD  rizatriptan (MAXALT-MLT) 5 MG disintegrating tablet Take 1 tablet (5 mg total) by mouth as needed for migraine. May repeat in 2 hours if needed 12/13/18   Monica Becton, MD    Family History Family History  Problem Relation Age of Onset  . Heart attack Maternal Grandmother   . Rashes / Skin problems Paternal Grandmother   . Clotting disorder Paternal Uncle     Social History Social History   Tobacco Use  . Smoking status: Never Smoker  . Smokeless tobacco: Never Used  Substance Use Topics  . Alcohol use: No    Comment: socially when not pregnant  . Drug use: No     Allergies   Rifaximin; Gluten meal; Clavulanic acid; Tessalon [benzonatate]; Xifaxan [rifaximin]; and Levothyroxine sodium   Review of Systems Review of Systems    Constitutional: Negative for chills and fever.  Gastrointestinal: Positive for abdominal pain and nausea. Negative for diarrhea and vomiting.  Genitourinary: Positive for flank pain (Right). Negative for dysuria, frequency, hematuria and urgency.  Musculoskeletal: Positive for back pain.  Skin: Negative for rash.     Physical Exam Triage Vital Signs ED Triage Vitals [02/16/19 0928]  Enc Vitals Group     BP 115/80     Pulse Rate 95     Resp      Temp 98.1 F (36.7 C)     Temp Source Oral     SpO2 100 %     Weight 100 lb 4 oz (45.5 kg)     Height 5\' 6"  (1.676 m)  Head Circumference      Peak Flow      Pain Score 8     Pain Loc      Pain Edu?      Excl. in GC?    No data found.  Updated Vital Signs BP 115/80 (BP Location: Right Arm)   Pulse 95   Temp 98.1 F (36.7 C) (Oral)   Ht 5\' 6"  (1.676 m)   Wt 100 lb 4 oz (45.5 kg)   LMP 02/09/2019   SpO2 100%   BMI 16.18 kg/m   Visual Acuity Right Eye Distance:   Left Eye Distance:   Bilateral Distance:    Right Eye Near:   Left Eye Near:    Bilateral Near:     Physical Exam Vitals signs and nursing note reviewed.  Constitutional:      Appearance: Normal appearance. She is well-developed.     Comments: Pt lying on exam bed, appears uncomfortable but is alert and cooperative during exam  HENT:     Head: Normocephalic and atraumatic.  Neck:     Musculoskeletal: Normal range of motion.  Cardiovascular:     Rate and Rhythm: Normal rate and regular rhythm.  Pulmonary:     Effort: Pulmonary effort is normal.     Breath sounds: Normal breath sounds.  Abdominal:     General: Bowel sounds are normal. There is no distension.     Palpations: Abdomen is soft. There is no mass.     Tenderness: There is abdominal tenderness in the right upper quadrant. There is right CVA tenderness. There is no left CVA tenderness, guarding or rebound.     Hernia: No hernia is present.  Musculoskeletal: Normal range of motion.   Skin:    General: Skin is warm and dry.  Neurological:     Mental Status: She is alert and oriented to person, place, and time.  Psychiatric:        Behavior: Behavior normal.      UC Treatments / Results  Labs (all labs ordered are listed, but only abnormal results are displayed) Labs Reviewed  COMPLETE METABOLIC PANEL WITH GFR - Abnormal; Notable for the following components:      Result Value   Total Protein 5.7 (*)    Globulin 1.5 (*)    AG Ratio 2.8 (*)    All other components within normal limits  POCT URINALYSIS DIP (MANUAL ENTRY) - Abnormal; Notable for the following components:   pH, UA 8.5 (*)    All other components within normal limits  POCT CBC W AUTO DIFF ([facility])  POCT URINE PREGNANCY    EKG None  Radiology Dg Abdomen 1 View  Result Date: 02/16/2019 CLINICAL DATA:  Right flank pain for the past 1.5 hours. EXAM: ABDOMEN - 1 VIEW COMPARISON:  Abdomen and pelvis CT dated 05/24/2010. FINDINGS: Normal bowel gas pattern. No calcified urinary tract calculi seen. Unremarkable bones. IMPRESSION: Normal examination. Electronically Signed   By: Beckie Salts M.D.   On: 02/16/2019 10:12   US Abdomen Limited Ruq  Result Date: 02/16/2019 CLINICAL DATA:  Right-sided flank pain and nausea for 2 days. EXAM: ULTRASOUND ABDOMEN LIMITED RIGHT UPPER QUADRANT COMPARISON:  Plain films of earlier today.  CT of 05/24/2010. FINDINGS: Gallbladder: No gallstones or wall thickening visualized. No sonographic Murphy sign noted by sonographer. Common bile duct: Diameter: Normal, 1 mm. Liver: No focal lesion identified. Within normal limits in parenchymal echogenicity. Portal vein is patent on color Doppler imaging  with normal direction of blood flow towards the liver. Minimal right-sided pelviectasis. IMPRESSION: 1. Minimal right-sided pelviectasis. 2. No other explanation for patient's symptoms. Electronically Signed   By: Jeronimo Greaves M.D.   On: 02/16/2019 15:04     Procedures Procedures (including critical care time)  Medications Ordered in UC Medications  acetaminophen (TYLENOL) tablet 650 mg (650 mg Oral Given 02/16/19 4098)    Initial Impression / Assessment and Plan / UC Course  I have reviewed the triage vital signs and the nursing notes.  Pertinent labs & imaging results that were available during my care of the patient were reviewed by me and considered in my medical decision making (see chart for details).     No evidence of UTI at this time No stone seen on KUB Discussed sending pt to emergency department for further evaluation of pain. Pt would like to avoid ER if possible CMP sent stat- unremarkable RUQ abdominal U/S ordered [facility] pt at [facility] for 2:30PM  Discussed imaging with pt over the phone.  Symptoms most likely due to a renal stone Encouraged good hydration and f/u with PCP this week. Discussed symptoms that warrant emergent care in the ED.   Final Clinical Impressions(s) / UC Diagnoses   Final diagnoses:  Other acute back pain  Right flank pain  RUQ abdominal pain  Nausea without vomiting     Discharge Instructions      The source of your pain has not been determined yet.    Please go to [facility] today for an ultrasound to help determine if your gallbladder or a kidney stone is the source of the pain.  If your pain worsens or started to go into your Right lower abdomen, you develop fever or vomiting, or other new concerning symptoms have developed, please go to the closest emergency department for further evaluation and treatment.      ED Prescriptions    Medication Sig Dispense Auth. Provider   HYDROcodone-acetaminophen (NORCO/VICODIN) 5-325 MG tablet Take 2 tablets by mouth every 6 (six) hours as needed. 10 tablet Lurene Shadow, PA-C     Controlled Substance Prescriptions [place] Controlled Substance Registry consulted? Yes, I have consulted the [place] Controlled Substances Registry  for this patient, and feel the risk/benefit ratio today is favorable for proceeding with this prescription for a controlled substance.   Lurene Shadow, [place] 02/16/19 1531

## 2019-02-17 ENCOUNTER — Telehealth: Payer: Self-pay | Admitting: Emergency Medicine

## 2019-02-17 NOTE — Telephone Encounter (Signed)
-----   Message from Lurene Shadow, [place] sent at 02/17/2019 11:19 AM EDT ----- No significant change from 47mo ago. The slight changes in protein and globulin would not be causing pt's flank pain. Her kidneys (Cr and BUN) and liver function (AST & ALT) are both normal, reassuring.  Please encourage her to follow up with her PCP for recheck of labs per Dr. Lucienne Minks recommendation.

## 2019-02-17 NOTE — Telephone Encounter (Signed)
Patient informed of results per Erin's recommendation.  Patient will follow up with her PCP.  Patient did state that she is feeling somewhat better than she was yesterday.

## 2019-03-06 ENCOUNTER — Other Ambulatory Visit: Payer: Self-pay

## 2019-03-06 ENCOUNTER — Encounter: Payer: Self-pay | Admitting: Osteopathic Medicine

## 2019-03-06 ENCOUNTER — Ambulatory Visit (INDEPENDENT_AMBULATORY_CARE_PROVIDER_SITE_OTHER): Payer: BLUE CROSS/BLUE SHIELD | Admitting: Osteopathic Medicine

## 2019-03-06 ENCOUNTER — Telehealth: Payer: Self-pay

## 2019-03-06 VITALS — Temp 98.2°F | Wt 98.2 lb

## 2019-03-06 DIAGNOSIS — E039 Hypothyroidism, unspecified: Secondary | ICD-10-CM

## 2019-03-06 DIAGNOSIS — R799 Abnormal finding of blood chemistry, unspecified: Secondary | ICD-10-CM | POA: Diagnosis not present

## 2019-03-06 NOTE — Patient Instructions (Addendum)
Plan: Will recheck labs Continue social isolation precautions - ok to come to lab, caution if the waiting room looks busy. Come in back entrance so no touching surfaces.  Call/message me if any concerns!

## 2019-03-06 NOTE — Telephone Encounter (Signed)
Looks like she was in the ER earlier this month, needs phone appt for follow-up and discuss lab results. (Please make this a 40 min encounter, thanks)

## 2019-03-06 NOTE — Progress Notes (Addendum)
Virtual Visit  via Video or Phone Note  I connected with      Emily [name] on 03/06/19 at 2:12 PM by a telemedicine application and verified that I am speaking with the correct person using two identifiers.   I discussed the limitations of evaluation and management by telemedicine and the availability of in person appointments. The patient expressed understanding and agreed to proceed.  History of Present Illness: Emily [name] is a 38 y.o. female who would like to discuss lab results, abdominal pain   Seen in UC 02/16/2019 (18 days ago) concerns of R flank pain x1 day. Aching/sharp quality. Assoc w/ nausea, zofran helped.  Assoc w/ back pain. Exam noted RUQ tenderness, R CVA tenderness.   Pt concerned about abnormal labs and wanted recheck. In 12/2018 labs ok, pt had had some electrolyte derangements which normalized. Low protein levels but known malnutrition. Thyroid levels were off but pt hadn't been taking meds. UC labs 18 days ago, UA ok except elevated pH, CBC ok, UPT neg, CMP showed protein 5.7 (was 6.6 in 12/2018), globulin 1.5 (was  2.0 in 12/2018) and AG ratio 2.8 (was 2.3 in 12/2018). US abdomen no major problem.   Pain in UC reported as severe, much better now. Concerned about protein levels. Is socially isolating during coronavirus outbreak.      Observations/Objective: Temp 98.2 F (36.8 C) (Oral)   Wt 98 lb 3.2 oz (44.5 kg)   LMP 02/09/2019 Comment: pregnancy test performed  BMI 15.85 kg/m  BP Readings from Last 3 Encounters:  02/16/19 115/80  12/21/18 125/86  12/19/18 118/76   Wt Readings from Last 3 Encounters:  03/06/19 98 lb 3.2 oz (44.5 kg)  02/16/19 100 lb 4 oz (45.5 kg)  12/21/18 93 lb 4.8 oz (42.3 kg)    Exam: Normal Speech.    Lab and Radiology Results Recent Results (from the past 2160 hour(s))  CBC with Differential/Platelet     Status: None   Collection Time: 12/13/18  3:36 PM  Result Value Ref Range   WBC 4.7 3.8 - 10.8 Thousand/uL    RBC 4.01 3.80 - 5.10 Million/uL   Hemoglobin 12.5 11.7 - 15.5 g/dL   HCT 29.1 91.6 - 60.6 %   MCV 87.3 80.0 - 100.0 fL   MCH 31.2 27.0 - 33.0 pg   MCHC 35.7 32.0 - 36.0 g/dL   RDW 00.4 59.9 - 77.4 %   Platelets 174 140 - 400 Thousand/uL   MPV 11.8 7.5 - 12.5 fL   Neutro Abs 2,914 1,500 - 7,800 cells/uL   Lymphs Abs 1,246 850 - 3,900 cells/uL   Absolute Monocytes 432 200 - 950 cells/uL   Eosinophils Absolute 89 15 - 500 cells/uL   Basophils Absolute 19 0 - 200 cells/uL   Neutrophils Relative % 62 %   Total Lymphocyte 26.5 %   Monocytes Relative 9.2 %   Eosinophils Relative 1.9 %   Basophils Relative 0.4 %  Comprehensive metabolic panel     Status: Abnormal   Collection Time: 12/13/18  3:36 PM  Result Value Ref Range   Glucose, Bld 91 65 - 99 mg/dL    Comment: .            Fasting reference interval .    BUN 7 7 - 25 mg/dL   Creat 1.42 3.95 - 3.20 mg/dL   BUN/Creatinine Ratio NOT APPLICABLE 6 - 22 (calc)   Sodium 142 135 - 146 mmol/L   Potassium 3.0 (L)  3.5 - 5.3 mmol/L   Chloride 111 (H) 98 - 110 mmol/L   CO2 20 20 - 32 mmol/L   Calcium 7.6 (L) 8.6 - 10.2 mg/dL   Total Protein 5.2 (L) 6.1 - 8.1 g/dL   Albumin 3.8 3.6 - 5.1 g/dL   Globulin 1.4 (L) 1.9 - 3.7 g/dL (calc)   AG Ratio 2.7 (H) 1.0 - 2.5 (calc)   Total Bilirubin 0.7 0.2 - 1.2 mg/dL   Alkaline phosphatase (APISO) 33 33 - 115 U/L   AST 28 10 - 30 U/L   ALT 21 6 - 29 U/L  Amylase     Status: None   Collection Time: 12/13/18  3:36 PM  Result Value Ref Range   Amylase 44 21 - 101 U/L  Lipase     Status: None   Collection Time: 12/13/18  3:36 PM  Result Value Ref Range   Lipase 31 7 - 60 U/L  TSH     Status: Abnormal   Collection Time: 12/19/18  1:57 PM  Result Value Ref Range   TSH 24.86 (H) mIU/L    Comment:           Reference Range .           > or = 20 Years  0.40-4.50 .                Pregnancy Ranges           First trimester    0.26-2.66           Second trimester   0.55-2.73           Third  trimester    0.43-2.91   T4, free     Status: None   Collection Time: 12/19/18  1:57 PM  Result Value Ref Range   Free T4 1.0 0.8 - 1.8 ng/dL  COMPLETE METABOLIC PANEL WITH GFR     Status: None   Collection Time: 12/19/18  1:57 PM  Result Value Ref Range   Glucose, Bld 94 65 - 99 mg/dL    Comment: .            Fasting reference interval .    BUN 7 7 - 25 mg/dL   Creat 9.52 8.41 - 3.24 mg/dL   GFR, Est Non African American 113 > OR = 60 mL/min/1.34m2   GFR, Est African American 131 > OR = 60 mL/min/1.49m2   BUN/Creatinine Ratio NOT APPLICABLE 6 - 22 (calc)   Sodium 142 135 - 146 mmol/L   Potassium 4.0 3.5 - 5.3 mmol/L   Chloride 106 98 - 110 mmol/L   CO2 28 20 - 32 mmol/L   Calcium 9.5 8.6 - 10.2 mg/dL   Total Protein 6.6 6.1 - 8.1 g/dL   Albumin 4.6 3.6 - 5.1 g/dL   Globulin 2.0 1.9 - 3.7 g/dL (calc)   AG Ratio 2.3 1.0 - 2.5 (calc)   Total Bilirubin 0.4 0.2 - 1.2 mg/dL   Alkaline phosphatase (APISO) 39 33 - 115 U/L   AST 18 10 - 30 U/L   ALT 25 6 - 29 U/L  POCT urinalysis dipstick (new)     Status: Abnormal   Collection Time: 02/16/19  9:31 AM  Result Value Ref Range   Color, UA yellow yellow   Clarity, UA clear clear   Glucose, UA negative negative mg/dL   Bilirubin, UA negative negative   Ketones, POC UA negative negative mg/dL   Spec Grav, UA 4.010 2.725 -  1.025   Blood, UA negative negative   pH, UA 8.5 (A) 5.0 - 8.0   Protein Ur, POC negative negative mg/dL   Urobilinogen, UA 0.2 0.2 or 1.0 E.U./dL   Nitrite, UA Negative Negative   Leukocytes, UA Negative Negative  POCT CBC w auto diff     Status: None   Collection Time: 02/16/19  9:50 AM  Result Value Ref Range   WBC      Comment: See Scanned Results   Lymphocytes relative %     Monocytes relative %     Neutrophils relative % (GR)     Lymphocytes absolute     Monocyes absolute     Neutrophils absolute (GR#)     RBC     Hemoglobin     Hematocrit     MCV     MCH     MCHC     RDW     Platelet count      MPV    POCT urine pregnancy     Status: None   Collection Time: 02/16/19  9:50 AM  Result Value Ref Range   Preg Test, Ur Negative Negative  COMPLETE METABOLIC PANEL WITH GFR     Status: Abnormal   Collection Time: 02/16/19 10:33 AM  Result Value Ref Range   Glucose, Bld 89 65 - 99 mg/dL    Comment: .            Fasting reference interval .    BUN 8 7 - 25 mg/dL   Creat 1.61 0.96 - 0.45 mg/dL   GFR, Est Non African American 120 > OR = 60 mL/min/1.50m2   GFR, Est African American 139 > OR = 60 mL/min/1.32m2   BUN/Creatinine Ratio NOT APPLICABLE 6 - 22 (calc)   Sodium 140 135 - 146 mmol/L   Potassium 4.3 3.5 - 5.3 mmol/L   Chloride 107 98 - 110 mmol/L   CO2 28 20 - 32 mmol/L   Calcium 8.7 8.6 - 10.2 mg/dL   Total Protein 5.7 (L) 6.1 - 8.1 g/dL   Albumin 4.2 3.6 - 5.1 g/dL   Globulin 1.5 (L) 1.9 - 3.7 g/dL (calc)   AG Ratio 2.8 (H) 1.0 - 2.5 (calc)   Total Bilirubin 0.6 0.2 - 1.2 mg/dL   Alkaline phosphatase (APISO) 31 31 - 125 U/L   AST 18 10 - 30 U/L   ALT 19 6 - 29 U/L       Assessment and Plan: 38 y.o. female with The primary encounter diagnosis was Hypothyroidism, unspecified type. A diagnosis of Abnormal serum total protein level was also pertinent to this visit.   PDMP not reviewed this encounter. Orders Placed This Encounter  Procedures  . CBC with Differential/Platelet  . COMPLETE METABOLIC PANEL WITH GFR  . TSH  . T4, free  . Microalbumin / creatinine urine ratio   No orders of the defined types were placed in this encounter.  Patient Instructions  Plan: Will recheck labs Continue social isolation precautions - ok to come to lab, caution if the waiting room looks busy. Come in back entrance so no touching surfaces.  Call/message me if any concerns!    Instructions sent via MyChart. If MyChart not available, pt was given option for info via personal e-mail w/ no guarantee of protected health info over unsecured e-mail communication, and MyChart  sign-up instructions were included.   Follow Up Instructions: Return if symptoms worsen or fail to improve.    I discussed  the assessment and treatment plan with the patient. The patient was provided an opportunity to ask questions and all were answered. The patient agreed with the plan and demonstrated an understanding of the instructions.   The patient was advised to call back or seek an in-person evaluation if the symptoms worsen or if the condition fails to improve as anticipated.  I provided 21 minutes of non-face-to-face time during this encounter.                      Historical information moved to improve visibility of documentation.  Past Medical History:  Diagnosis Date  . Anemia    history  . Bartholin cyst 12/27/2011  . Celiac disease   . Collagenous colitis   . Delivery of twins, both live 12/02/2012  . Dichorionic diamniotic twin pregnancy 12/01/2012  . Headache(784.0)    complex migraines with cardiac and neuro symptoms during pregnancy  . Hypothyroidism   . Infertility, female   . SVD (spontaneous vaginal delivery) 12/02/2012  . Thyroid disease    Past Surgical History:  Procedure Laterality Date  . BARTHOLIN CYST MARSUPIALIZATION  12/28/2011   Procedure: BARTHOLIN CYST MARSUPIALIZATION;  Surgeon: Sherron Monday, MD;  Location: [facility];  Service: Gynecology;  Laterality: N/A;  . COLONOSCOPY    . RHINOPLASTY    . TONSILLECTOMY    . UPPER GASTROINTESTINAL ENDOSCOPY    . WISDOM TOOTH EXTRACTION     Social History   Tobacco Use  . Smoking status: Never Smoker  . Smokeless tobacco: Never Used  Substance Use Topics  . Alcohol use: No    Comment: socially when not pregnant   family history includes Clotting disorder in her paternal uncle; Heart attack in her maternal grandmother; Rashes / Skin problems in her paternal grandmother.  Medications: Current Outpatient Medications  Medication Sig Dispense Refill  . ALPRAZolam (XANAX) 0.25 MG  tablet Take 1 tablet (0.25 mg total) by mouth daily as needed for anxiety (use sparingly to avoid dependence). 30 tablet 0  . betamethasone dipropionate (DIPROLENE) 0.05 % cream betamethasone dipropionate 0.05 % topical cream  APPLY A THIN LAYER TO THE AFFECTED AREA(S) BY TOPICAL ROUTE ONCE DAILY 30 g 0  . budesonide (PULMICORT) 1 MG/2ML nebulizer solution Take 2 mLs (1 mg total) by nebulization 2 (two) times daily. 20 mL 0  . Budesonide ER 9 MG TB24 Take by mouth.    . diphenoxylate-atropine (LOMOTIL) 2.5-0.025 MG tablet Take daily as needed by mouth.    . ferrous sulfate 325 (65 FE) MG EC tablet Take 1 tablet (325 mg total) by mouth 3 (three) times daily with meals. 90 tablet 1  . gabapentin (NEURONTIN) 100 MG capsule Take 100 mg by mouth 2 (two) times daily.    Marland Kitchen HYDROcodone-acetaminophen (NORCO/VICODIN) 5-325 MG tablet Take 2 tablets by mouth every 6 (six) hours as needed. 10 tablet 0  . ibuprofen (ADVIL,MOTRIN) 800 MG tablet Take 1 tablet (800 mg total) by mouth every 8 (eight) hours as needed. 45 tablet 1  . levothyroxine (UNITHROID) 137 MCG tablet Take 1 tablet (137 mcg total) by mouth daily before breakfast. 90 tablet 3  . meclizine (ANTIVERT) 25 MG tablet Take 0.5-1 tablets (12.5-25 mg total) by mouth 3 (three) times daily as needed for dizziness. 30 tablet 2  . metoprolol succinate (TOPROL-XL) 25 MG 24 hr tablet Take 1 tablet (25 mg total) by mouth daily as needed (for palpitations). 90 tablet 1  . ondansetron (ZOFRAN) 4 MG tablet Take  4 mg by mouth as needed.    . rizatriptan (MAXALT-MLT) 5 MG disintegrating tablet Take 1 tablet (5 mg total) by mouth as needed for migraine. May repeat in 2 hours if needed 10 tablet 3  . promethazine (PHENERGAN) 25 MG tablet Take 1 tablet (25 mg total) by mouth every 6 (six) hours as needed for nausea. (Patient not taking: Reported on 03/06/2019) 30 tablet 3   No current facility-administered medications for this visit.    Allergies  Allergen Reactions   . Rifaximin Itching and Other (See Comments)    Reaction:  Itching of throat  . Gluten Meal Diarrhea, Nausea And Vomiting and Other (See Comments)    Pt has celiac disease  . Amoxicillin-Pot Clavulanate Other (See Comments) and Hives    Itching throat - amoxicilin alone doesn't cause any problems   . Clavulanic Acid Other (See Comments)    Flushing  . Promethazine Swelling  . Tessalon [Benzonatate] Other (See Comments)    Lump on throat  . Xifaxan [Rifaximin]   . Levothyroxine Sodium Diarrhea    Only has GI reaction to synthroid brand    PDMP not reviewed this encounter. Orders Placed This Encounter  Procedures  . CBC with Differential/Platelet  . COMPLETE METABOLIC PANEL WITH GFR  . TSH  . T4, free  . Microalbumin / creatinine urine ratio   No orders of the defined types were placed in this encounter.

## 2019-03-06 NOTE — Telephone Encounter (Signed)
Pt left vm msg requesting a lab order. As per pt, last lab check CMP, CBC and thyroid levels were abnormal. Requesting to repeat labs. Pls advise, thanks.

## 2019-03-06 NOTE — Telephone Encounter (Signed)
Contacted patient and appointment was set up for today at 2pm, before your end slot. No further questions at this time.

## 2019-03-07 ENCOUNTER — Ambulatory Visit: Payer: BLUE CROSS/BLUE SHIELD | Admitting: Osteopathic Medicine

## 2019-03-07 LAB — COMPLETE METABOLIC PANEL WITH GFR
AG RATIO: 2.3 (calc) (ref 1.0–2.5)
ALT: 16 U/L (ref 6–29)
AST: 17 U/L (ref 10–30)
Albumin: 4.4 g/dL (ref 3.6–5.1)
Alkaline phosphatase (APISO): 30 U/L — ABNORMAL LOW (ref 31–125)
BUN: 13 mg/dL (ref 7–25)
CO2: 28 mmol/L (ref 20–32)
Calcium: 9.3 mg/dL (ref 8.6–10.2)
Chloride: 107 mmol/L (ref 98–110)
Creat: 0.83 mg/dL (ref 0.50–1.10)
GFR, Est African American: 106 mL/min/{1.73_m2} (ref >=60)
GFR, Est Non African American: 91 mL/min/{1.73_m2} (ref >=60)
Globulin: 1.9 g/dL (calc) (ref 1.9–3.7)
Glucose, Bld: 83 mg/dL (ref 65–99)
Potassium: 3.9 mmol/L (ref 3.5–5.3)
Sodium: 144 mmol/L (ref 135–146)
Total Bilirubin: 0.3 mg/dL (ref 0.2–1.2)
Total Protein: 6.3 g/dL (ref 6.1–8.1)

## 2019-03-07 LAB — CBC WITH DIFFERENTIAL/PLATELET
Absolute Monocytes: 614 cells/uL (ref 200–950)
Basophils Absolute: 58 cells/uL (ref 0–200)
Basophils Relative: 0.6 %
Eosinophils Absolute: 134 cells/uL (ref 15–500)
Eosinophils Relative: 1.4 %
HCT: 38.5 % (ref 35.0–45.0)
Hemoglobin: 13.2 g/dL (ref 11.7–15.5)
Lymphs Abs: 1238 cells/uL (ref 850–3900)
MCH: 30.5 pg (ref 27.0–33.0)
MCHC: 34.3 g/dL (ref 32.0–36.0)
MCV: 88.9 fL (ref 80.0–100.0)
MPV: 12.2 fL (ref 7.5–12.5)
Monocytes Relative: 6.4 %
Neutro Abs: 7555 cells/uL (ref 1500–7800)
Neutrophils Relative %: 78.7 %
Platelets: 222 10*3/uL (ref 140–400)
RBC: 4.33 10*6/uL (ref 3.80–5.10)
RDW: 11.4 % (ref 11.0–15.0)
Total Lymphocyte: 12.9 %
WBC: 9.6 10*3/uL (ref 3.8–10.8)

## 2019-03-07 LAB — TSH: TSH: 0.99 mIU/L

## 2019-03-07 LAB — MICROALBUMIN / CREATININE URINE RATIO
Creatinine, Urine: 120 mg/dL (ref 20–275)
Microalb Creat Ratio: 5 mcg/mg creat (ref <30)
Microalb, Ur: 0.6 mg/dL

## 2019-03-07 LAB — T4, FREE: Free T4: 1.5 ng/dL (ref 0.8–1.8)

## 2019-03-07 NOTE — Addendum Note (Signed)
Addended by: Deirdre Pippins on: 03/07/2019 01:21 PM   Modules accepted: Level of Service

## 2019-03-13 ENCOUNTER — Encounter: Payer: Self-pay | Admitting: Osteopathic Medicine

## 2019-03-13 DIAGNOSIS — R198 Other specified symptoms and signs involving the digestive system and abdomen: Secondary | ICD-10-CM | POA: Insufficient documentation

## 2019-04-03 ENCOUNTER — Telehealth: Payer: Self-pay

## 2019-04-03 DIAGNOSIS — Z0189 Encounter for other specified special examinations: Secondary | ICD-10-CM

## 2019-04-03 NOTE — Telephone Encounter (Addendum)
Pt left a vm msg requesting testing for COVID-19 ab. Would like to know if provider can direct her for testing or what facility she can be referred to for testing. Wants to see if she had any kind of exposure at the beginning of the pandemic.

## 2019-04-04 DIAGNOSIS — Z0189 Encounter for other specified special examinations: Secondary | ICD-10-CM | POA: Diagnosis not present

## 2019-04-04 NOTE — Telephone Encounter (Signed)
Pt advised. Verbalized understanding.

## 2019-04-04 NOTE — Telephone Encounter (Signed)
Orders in can come to lab anytime      5 mins spent

## 2019-04-05 LAB — SAR COV2 SEROLOGY (COVID19)AB(IGG),IA: SARS CoV2 AB IGG: NEGATIVE

## 2019-05-22 DIAGNOSIS — R110 Nausea: Secondary | ICD-10-CM | POA: Diagnosis not present

## 2019-07-01 ENCOUNTER — Other Ambulatory Visit: Payer: Self-pay | Admitting: Osteopathic Medicine

## 2019-07-02 ENCOUNTER — Other Ambulatory Visit: Payer: Self-pay | Admitting: Osteopathic Medicine

## 2019-07-09 ENCOUNTER — Other Ambulatory Visit: Payer: Self-pay | Admitting: *Deleted

## 2019-07-09 MED ORDER — LEVOTHYROXINE SODIUM 137 MCG PO TABS: 137.0000 ug | ORAL_TABLET | Freq: Every day | ORAL | 3 refills | Status: DC

## 2019-08-02 ENCOUNTER — Encounter: Payer: Self-pay | Admitting: Family Medicine

## 2019-08-02 ENCOUNTER — Other Ambulatory Visit: Payer: Self-pay

## 2019-08-02 ENCOUNTER — Telehealth (INDEPENDENT_AMBULATORY_CARE_PROVIDER_SITE_OTHER): Payer: BC Managed Care – PPO | Admitting: Family Medicine

## 2019-08-02 DIAGNOSIS — Z8719 Personal history of other diseases of the digestive system: Secondary | ICD-10-CM

## 2019-08-02 DIAGNOSIS — K529 Noninfective gastroenteritis and colitis, unspecified: Secondary | ICD-10-CM

## 2019-08-02 DIAGNOSIS — E039 Hypothyroidism, unspecified: Secondary | ICD-10-CM

## 2019-08-02 DIAGNOSIS — E876 Hypokalemia: Secondary | ICD-10-CM

## 2019-08-02 DIAGNOSIS — Z862 Personal history of diseases of the blood and blood-forming organs and certain disorders involving the immune mechanism: Secondary | ICD-10-CM | POA: Diagnosis not present

## 2019-08-02 DIAGNOSIS — R110 Nausea: Secondary | ICD-10-CM

## 2019-08-02 DIAGNOSIS — E559 Vitamin D deficiency, unspecified: Secondary | ICD-10-CM

## 2019-08-02 DIAGNOSIS — L608 Other nail disorders: Secondary | ICD-10-CM

## 2019-08-02 DIAGNOSIS — Z5181 Encounter for therapeutic drug level monitoring: Secondary | ICD-10-CM

## 2019-08-02 DIAGNOSIS — K900 Celiac disease: Secondary | ICD-10-CM

## 2019-08-02 MED ORDER — MIRTAZAPINE 7.5 MG PO TABS: 7.5000 mg | ORAL_TABLET | Freq: Every day | ORAL | 1 refills | Status: DC

## 2019-08-02 NOTE — Patient Instructions (Signed)
Thank you for coming in today.    

## 2019-08-02 NOTE — Addendum Note (Signed)
Addended by: Towana Badger on: 08/02/2019 03:32 PM   Modules accepted: Orders

## 2019-08-02 NOTE — Progress Notes (Signed)
Virtual Visit  via Video Note  I connected with      Emily [name] by a video enabled telemedicine application and verified that I am speaking with the correct person using two identifiers.   I discussed the limitations of evaluation and management by telemedicine and the availability of in person appointments. The patient expressed understanding and agreed to proceed.  History of Present Illness: Emily [name] is a 38 y.o. female who would like to discuss nail change and difficulty gaining weight.  Patient has significant past medical history for celiac disease, hypothyroidism, microscopic colitis.  This is resulted in vitamin and mineral deficiency and autoimmune disorders.  Over the last year she notes that she is had some nail change in her toenails and now in her fifth fingernails bilaterally.  She is tried some over-the-counter topical medicine for nail fungus but has not noted much difference over the last month.  She is not using super consistently.  She is worried about taking oral medication as were about side effects.  Additionally she notes that she is really struggled to gain weight.  She is supplementing her diet with Ensure 2 cans daily but still having trouble to gain weight.  She notes that other doctors have mentioned mirtazapine and is interested in starting that possibly.  Her daughter takes cyproheptadine which is helped her gain weight as well.    Observations/Objective: There were no vitals taken for this visit. Wt Readings from Last 5 Encounters:  03/06/19 98 lb 3.2 oz (44.5 kg)  02/16/19 100 lb 4 oz (45.5 kg)  12/21/18 93 lb 4.8 oz (42.3 kg)  12/19/18 94 lb 3.2 oz (42.7 kg)  12/13/18 96 lb (43.5 kg)   Exam: Appearance Normal Speech.     Patient provided pictures below:       Lab and Radiology Results Labs reviewed.  Patient had history of low B6.    Assessment and Plan: 38 y.o. female with  Nail change.  Etiology is somewhat unclear.  The  appearance of her nails is more consistent with toenail fungus however she has significant metabolic and autoimmune medical problems that could impair normal nail growth.  After discussion plan for metabolic work-up listed below. Will check basic vitamin and mineral deficiencies TSH sed rate etc. Encourage more frequent use of multivitamin.  She should begin plenty of vitamins with her boost but may have malabsorptive problems.  If no better with topical over-the-counter medication for nail fungus next up would be terbinafine.  I think it likely will help and she probably will be able to tolerate it however she like to try the topical medicine first.   Additionally we discussed difficulty gaining weight.  She has had discussions with other providers for this and is interested in mirtazapine.  I think that is an obvious choice.  She has had significant GI side effects from other medications will use low-dose at first.  Will start 7.5 at bedtime.  Check back with PCP or myself in about a month.  Can increase to 15 in the interim if tolerating well.  PDMP not reviewed this encounter. Orders Placed This Encounter  Procedures  . CBC  . COMPLETE METABOLIC PANEL WITH GFR  . VITAMIN D 25 Hydroxy (Vit-D Deficiency, Fractures)  . TSH  . T4, free  . Sedimentation rate  . T3, free  . Vitamin B12  . Folate  . Vitamin B6   Meds ordered this encounter  Medications  . mirtazapine (REMERON) 7.5 MG  tablet    Sig: Take 1 tablet (7.5 mg total) by mouth at bedtime.    Dispense:  30 tablet    Refill:  1    Follow Up Instructions:    I discussed the assessment and treatment plan with the patient. The patient was provided an opportunity to ask questions and all were answered. The patient agreed with the plan and demonstrated an understanding of the instructions.   The patient was advised to call back or seek an in-person evaluation if the symptoms worsen or if the condition fails to improve as  anticipated.  Time: 25 minutes of intraservice time, with >39 minutes of total time during today's visit.      Historical information moved to improve visibility of documentation.  Past Medical History:  Diagnosis Date  . Anemia    history  . Bartholin cyst 12/27/2011  . Celiac disease   . Collagenous colitis   . Delivery of twins, both live 12/02/2012  . Dichorionic diamniotic twin pregnancy 12/01/2012  . Headache(784.0)    complex migraines with cardiac and neuro symptoms during pregnancy  . Hypothyroidism   . Infertility, female   . SVD (spontaneous vaginal delivery) 12/02/2012  . Thyroid disease    Past Surgical History:  Procedure Laterality Date  . BARTHOLIN CYST MARSUPIALIZATION  12/28/2011   Procedure: BARTHOLIN CYST MARSUPIALIZATION;  Surgeon: Sherron MondayJody Bovard, MD;  Location: [facility];  Service: Gynecology;  Laterality: N/A;  . COLONOSCOPY    . RHINOPLASTY    . TONSILLECTOMY    . UPPER GASTROINTESTINAL ENDOSCOPY    . WISDOM TOOTH EXTRACTION     Social History   Tobacco Use  . Smoking status: Never Smoker  . Smokeless tobacco: Never Used  Substance Use Topics  . Alcohol use: No    Comment: socially when not pregnant   family history includes Clotting disorder in her paternal uncle; Heart attack in her maternal grandmother; Rashes / Skin problems in her paternal grandmother.  Medications: Current Outpatient Medications  Medication Sig Dispense Refill  . ALPRAZolam (XANAX) 0.25 MG tablet Take 1 tablet (0.25 mg total) by mouth daily as needed for anxiety (use sparingly to avoid dependence). 30 tablet 0  . betamethasone dipropionate (DIPROLENE) 0.05 % cream betamethasone dipropionate 0.05 % topical cream  APPLY A THIN LAYER TO THE AFFECTED AREA(S) BY TOPICAL ROUTE ONCE DAILY 30 g 0  . budesonide (PULMICORT) 1 MG/2ML nebulizer solution Take 2 mLs (1 mg total) by nebulization 2 (two) times daily. 20 mL 0  . Budesonide ER 9 MG TB24 Take by mouth.    .  diphenoxylate-atropine (LOMOTIL) 2.5-0.025 MG tablet Take daily as needed by mouth.    . ferrous sulfate 325 (65 FE) MG EC tablet Take 1 tablet (325 mg total) by mouth 3 (three) times daily with meals. 90 tablet 1  . gabapentin (NEURONTIN) 100 MG capsule Take 100 mg by mouth 2 (two) times daily.    Marland Kitchen. HYDROcodone-acetaminophen (NORCO/VICODIN) 5-325 MG tablet Take 2 tablets by mouth every 6 (six) hours as needed. 10 tablet 0  . ibuprofen (ADVIL,MOTRIN) 800 MG tablet Take 1 tablet (800 mg total) by mouth every 8 (eight) hours as needed. 45 tablet 1  . levothyroxine (UNITHROID) 137 MCG tablet Take 1 tablet (137 mcg total) by mouth daily before breakfast. 90 tablet 3  . meclizine (ANTIVERT) 25 MG tablet Take 0.5-1 tablets (12.5-25 mg total) by mouth 3 (three) times daily as needed for dizziness. 30 tablet 2  . metoprolol succinate (TOPROL-XL)  25 MG 24 hr tablet Take 1 tablet (25 mg total) by mouth daily as needed (for palpitations). 90 tablet 1  . mirtazapine (REMERON) 7.5 MG tablet Take 1 tablet (7.5 mg total) by mouth at bedtime. 30 tablet 1  . ondansetron (ZOFRAN) 4 MG tablet Take 4 mg by mouth as needed.    . rizatriptan (MAXALT-MLT) 5 MG disintegrating tablet Take 1 tablet (5 mg total) by mouth as needed for migraine. May repeat in 2 hours if needed 10 tablet 3   No current facility-administered medications for this visit.    Allergies  Allergen Reactions  . Rifaximin Itching and Other (See Comments)    Reaction:  Itching of throat  . Gluten Meal Diarrhea, Nausea And Vomiting and Other (See Comments)    Pt has celiac disease  . Amoxicillin-Pot Clavulanate Other (See Comments) and Hives    Itching throat - amoxicilin alone doesn't cause any problems   . Clavulanic Acid Other (See Comments)    Flushing  . Promethazine Swelling  . Tessalon [Benzonatate] Other (See Comments)    Lump on throat  . Xifaxan [Rifaximin]   . Levothyroxine Sodium Diarrhea    Only has GI reaction to synthroid brand

## 2019-08-14 DIAGNOSIS — H5713 Ocular pain, bilateral: Secondary | ICD-10-CM | POA: Diagnosis not present

## 2019-08-25 ENCOUNTER — Other Ambulatory Visit: Payer: Self-pay | Admitting: Family Medicine

## 2019-09-04 DIAGNOSIS — R110 Nausea: Secondary | ICD-10-CM | POA: Diagnosis not present

## 2019-09-06 ENCOUNTER — Ambulatory Visit: Payer: BC Managed Care – PPO | Admitting: Family Medicine

## 2019-09-06 ENCOUNTER — Other Ambulatory Visit: Payer: Self-pay

## 2019-09-06 ENCOUNTER — Encounter: Payer: Self-pay | Admitting: Family Medicine

## 2019-09-06 VITALS — BP 116/83 | HR 102 | Temp 98.3°F | Wt 100.0 lb

## 2019-09-06 DIAGNOSIS — R109 Unspecified abdominal pain: Secondary | ICD-10-CM | POA: Diagnosis not present

## 2019-09-06 DIAGNOSIS — K900 Celiac disease: Secondary | ICD-10-CM | POA: Diagnosis not present

## 2019-09-06 DIAGNOSIS — R110 Nausea: Secondary | ICD-10-CM

## 2019-09-06 DIAGNOSIS — E039 Hypothyroidism, unspecified: Secondary | ICD-10-CM | POA: Diagnosis not present

## 2019-09-06 LAB — POCT URINALYSIS DIPSTICK
Bilirubin, UA: NEGATIVE
Glucose, UA: NEGATIVE
Ketones, UA: NEGATIVE
Leukocytes, UA: NEGATIVE
Nitrite, UA: NEGATIVE
Protein, UA: NEGATIVE
Spec Grav, UA: 1.01 (ref 1.010–1.025)
Urobilinogen, UA: 0.2 E.U./dL
pH, UA: 7 (ref 5.0–8.0)

## 2019-09-06 NOTE — Progress Notes (Signed)
.  fal

## 2019-09-06 NOTE — Progress Notes (Signed)
Emily [name] is a 38 y.o. female who presents to [facility] today for nausea and flank pain.  Patient has a medical history significant for inflammatory bowel disease with colitis as well as possible gastroparesis.  She has been seen by her gastroenterologist recently.  She is been having issues where she feels quite nauseated and has early satiety with food and drink.  This is thought to be a gastroparesis episode.  She notes this is very frustrating.  She is taking Zofran which does help with vomiting but does not help with nausea. I had seen her recently for failure to gain weight and early satiety and started mirtazapine.  She took it a few times but noted significant fatigue with it and discontinued it.  She does not think the nausea episode is related to the mirtazapine. She is had allergic reaction to Phenergan.  She is not sure if she is ever had metoclopramide.  Additionally she notes left-sided flank pain.  This is been present since yesterday.  She denies any urinary frequency urgency or dysuria.  She is currently having her menstrual period.  She denies any history of kidney stones.  She denies any diarrhea or vomiting currently.   ROS as above:  Exam:  BP 116/83   Pulse (!) 102   Temp 98.3 F (36.8 C) (Oral)   Wt 100 lb (45.4 kg)   BMI 16.14 kg/m  Wt Readings from Last 5 Encounters:  09/06/19 100 lb (45.4 kg)  03/06/19 98 lb 3.2 oz (44.5 kg)  02/16/19 100 lb 4 oz (45.5 kg)  12/21/18 93 lb 4.8 oz (42.3 kg)  12/19/18 94 lb 3.2 oz (42.7 kg)    Gen: Well NAD HEENT: EOMI,  MMM Lungs: Normal work of breathing. CTABL Heart: RRR no MRG heart rate 88 bpm per my check Abd: NABS, Soft. Nondistended, minimally tender left upper quadrant abdomen with no rebound or guarding.  No CVA angle tenderness to percussion. Exts: Brisk capillary refill, warm and well  perfused.  Psych alert and oriented anxious affect.  Sometimes tearful.  Speech and thought process are normal.  No SI or HI.  Lab and Radiology Results Results for orders placed or performed in visit on 09/06/19 (from the past 72 hour(s))  POCT Urinalysis Dipstick     Status: Abnormal   Collection Time: 09/06/19 11:12 AM  Result Value Ref Range   Color, UA yellow    Clarity, UA clear    Glucose, UA Negative Negative   Bilirubin, UA negative    Ketones, UA negative    Spec Grav, UA 1.010 1.010 - 1.025   Blood, UA moderate    pH, UA 7.0 5.0 - 8.0   Protein, UA Negative Negative   Urobilinogen, UA 0.2 0.2 or 1.0 E.U./dL   Nitrite, UA negative    Leukocytes, UA Negative Negative   Appearance     Odor     No results found.    Assessment and Plan: 38 y.o. female with  Flank pain.  Etiology is somewhat unclear.  Patient does have blood in urine but she is having a menstrual period now which certainly could be a contributing factor.  Plan for further urine testing including urine microscope evaluation.  Plan to proceed with lab work-up listed below and recheck near future if not improving.  Precautions reviewed.  I am the on-call provider this week therefore will be happy to field call us if  needed.  Nausea.  Symptoms likely related to gastroparesis.  Patient is the process of proceeding with work-up per GI for this.  Discussed options.  Neck step would probably be metoclopramide.  She wants to run this by her gastroenterologist first which is totally reasonable.  Happy to prescribe medication as needed.   PDMP not reviewed this encounter. Orders Placed This Encounter  Procedures  . Urine Microscopic  . COMPLETE METABOLIC PANEL WITH GFR  . TSH  . Lipase  . CBC with Differential/Platelet  . POCT Urinalysis Dipstick   No orders of the defined types were placed in this encounter.    Historical information moved to improve visibility of documentation.  Past Medical History:   Diagnosis Date  . Anemia    history  . Bartholin cyst 12/27/2011  . Celiac disease   . Collagenous colitis   . Delivery of twins, both live 12/02/2012  . Dichorionic diamniotic twin pregnancy 12/01/2012  . Headache(784.0)    complex migraines with cardiac and neuro symptoms during pregnancy  . Hypothyroidism   . Infertility, female   . SVD (spontaneous vaginal delivery) 12/02/2012  . Thyroid disease    Past Surgical History:  Procedure Laterality Date  . BARTHOLIN CYST MARSUPIALIZATION  12/28/2011   Procedure: BARTHOLIN CYST MARSUPIALIZATION;  Surgeon: Sherron MondayJody Bovard, MD;  Location: [facility];  Service: Gynecology;  Laterality: N/A;  . COLONOSCOPY    . RHINOPLASTY    . TONSILLECTOMY    . UPPER GASTROINTESTINAL ENDOSCOPY    . WISDOM TOOTH EXTRACTION     Social History   Tobacco Use  . Smoking status: Never Smoker  . Smokeless tobacco: Never Used  Substance Use Topics  . Alcohol use: No    Comment: socially when not pregnant   family history includes Clotting disorder in her paternal uncle; Heart attack in her maternal grandmother; Rashes / Skin problems in her paternal grandmother.  Medications: Current Outpatient Medications  Medication Sig Dispense Refill  . ALPRAZolam (XANAX) 0.25 MG tablet Take 1 tablet (0.25 mg total) by mouth daily as needed for anxiety (use sparingly to avoid dependence). 30 tablet 0  . betamethasone dipropionate (DIPROLENE) 0.05 % cream betamethasone dipropionate 0.05 % topical cream  APPLY A THIN LAYER TO THE AFFECTED AREA(S) BY TOPICAL ROUTE ONCE DAILY 30 g 0  . budesonide (PULMICORT) 1 MG/2ML nebulizer solution Take 2 mLs (1 mg total) by nebulization 2 (two) times daily. 20 mL 0  . Budesonide ER 9 MG TB24 Take by mouth.    . diphenoxylate-atropine (LOMOTIL) 2.5-0.025 MG tablet Take daily as needed by mouth.    . ferrous sulfate 325 (65 FE) MG EC tablet Take 1 tablet (325 mg total) by mouth 3 (three) times daily with meals. 90 tablet 1  . gabapentin  (NEURONTIN) 100 MG capsule Take 100 mg by mouth 2 (two) times daily.    Marland Kitchen. HYDROcodone-acetaminophen (NORCO/VICODIN) 5-325 MG tablet Take 2 tablets by mouth every 6 (six) hours as needed. 10 tablet 0  . ibuprofen (ADVIL,MOTRIN) 800 MG tablet Take 1 tablet (800 mg total) by mouth every 8 (eight) hours as needed. 45 tablet 1  . levothyroxine (UNITHROID) 137 MCG tablet Take 1 tablet (137 mcg total) by mouth daily before breakfast. 90 tablet 3  . meclizine (ANTIVERT) 25 MG tablet Take 0.5-1 tablets (12.5-25 mg total) by mouth 3 (three) times daily as needed for dizziness. 30 tablet 2  . metoprolol succinate (TOPROL-XL) 25 MG 24 hr tablet Take 1 tablet (25 mg total) by  mouth daily as needed (for palpitations). 90 tablet 1  . ondansetron (ZOFRAN) 4 MG tablet Take 4 mg by mouth as needed.    . rizatriptan (MAXALT-MLT) 5 MG disintegrating tablet Take 1 tablet (5 mg total) by mouth as needed for migraine. May repeat in 2 hours if needed 10 tablet 3   No current facility-administered medications for this visit.    Allergies  Allergen Reactions  . Rifaximin Itching and Other (See Comments)    Reaction:  Itching of throat  . Gluten Meal Diarrhea, Nausea And Vomiting and Other (See Comments)    Pt has celiac disease  . Amoxicillin-Pot Clavulanate Other (See Comments) and Hives    Itching throat - amoxicilin alone doesn't cause any problems   . Clavulanic Acid Other (See Comments)    Flushing  . Promethazine Swelling  . Tessalon [Benzonatate] Other (See Comments)    Lump on throat  . Xifaxan [Rifaximin]   . Levothyroxine Sodium Diarrhea    Only has GI reaction to synthroid brand     Discussed warning signs or symptoms. Please see discharge instructions. Patient expresses understanding.

## 2019-09-06 NOTE — Patient Instructions (Addendum)
Thank you for coming in today.  I will disuss with your GI as well.  Conider reglan and klonopin.  Keep me updated.  Get labs.  If your belly pain worsens, or you have high fever, bad vomiting, blood in your stool or black tarry stool go to the Emergency Room.   [phone] is my cell phone.

## 2019-09-07 LAB — URINALYSIS, MICROSCOPIC ONLY
Bacteria, UA: NONE SEEN /HPF
Hyaline Cast: NONE SEEN /LPF

## 2019-09-25 DIAGNOSIS — R110 Nausea: Secondary | ICD-10-CM | POA: Diagnosis not present

## 2019-11-01 ENCOUNTER — Ambulatory Visit (INDEPENDENT_AMBULATORY_CARE_PROVIDER_SITE_OTHER): Payer: BC Managed Care – PPO | Admitting: Osteopathic Medicine

## 2019-11-01 ENCOUNTER — Encounter: Payer: Self-pay | Admitting: Osteopathic Medicine

## 2019-11-01 DIAGNOSIS — J22 Unspecified acute lower respiratory infection: Secondary | ICD-10-CM | POA: Diagnosis not present

## 2019-11-01 MED ORDER — BUDESONIDE 1 MG/2ML IN SUSP: 1.0000 mg | Freq: Two times a day (BID) | RESPIRATORY_TRACT | 0 refills | Status: DC

## 2019-11-01 MED ORDER — AMITRIPTYLINE HCL 10 MG PO TABS: 10.0000 mg | ORAL_TABLET | Freq: Every day | ORAL | 1 refills | Status: DC

## 2019-11-01 NOTE — Patient Instructions (Signed)
Plan: Let us try the amitriptyline around bedtime, hopefully this will help calm the GI issues as well as treat anxiety/depression.  Let us give it a couple weeks on this medication and see how you do, but definitely let me know ASAP if any concerning side effects, or if any other questions or concerns.  Reminder in the chart to reach out to you by phone in a couple of weeks, please let us know if you do not hear back!

## 2019-11-01 NOTE — Progress Notes (Signed)
Virtual Visit via Video (App used: Doximity) Note  I connected with      Emily [name] on 11/01/19 at 10:35 AM by a telemedicine application and verified that I am speaking with the correct person using two identifiers.  Patient is at home I am in office   I discussed the limitations of evaluation and management by telemedicine and the availability of in person appointments. The patient expressed understanding and agreed to proceed.  History of Present Illness: Emily [name] is a 38 y.o. female who would like to discuss medication change for anxiety/depression   GI doctor gave her some Xanax which they stated would help the gastroparesis and this seems to be helping but she'd like to avoid long term use unless there is a better option.   Previous medications tended to exacerbate GI issues or cause severe fatigue: Amitriptyline - not sure but would be willing to try it again  Mirtazapine - exhaustion/fatigue  Wellbutrin -  Prozac - not sure  Zoloft - not sure Trintellix - not sure but definitely NO    GAD 7 : Generalized Anxiety Score 11/01/2019 03/06/2019 12/19/2018 03/23/2018  Nervous, Anxious, on Edge 3 2 1 3   Control/stop worrying 3 2 1 3   Worry too much - different things 3 2 1 3   Trouble relaxing 3 1 1 2   Restless 1 0 1 0  Easily annoyed or irritable 2 0 1 2  Afraid - awful might happen 3 2 1 3   Total GAD 7 Score 18 9 7 16   Anxiety Difficulty Extremely difficult Not difficult at all Somewhat difficult Very difficult    Depression screen [facility] 2/9 11/01/2019 03/06/2019 12/19/2018  Decreased Interest 2 1 0  Down, Depressed, Hopeless 3 1 0  PHQ - 2 Score 5 2 0  Altered sleeping 2 0 2  Tired, decreased energy 2 1 3   Change in appetite 2 1 3   Feeling bad or failure about yourself  3 1 0  Trouble concentrating 1 2 1   Moving slowly or fidgety/restless 1 0 0  Suicidal thoughts 0 0 0  PHQ-9 Score 16 7 9   Difficult doing work/chores Extremely dIfficult Somewhat difficult  Somewhat difficult         Observations/Objective: Pulse 99   Temp 98.4 F (36.9 C) (Oral)   Wt 102 lb 8 oz (46.5 kg)   BMI 16.54 kg/m  BP Readings from Last 3 Encounters:  09/06/19 116/83  02/16/19 115/80  12/21/18 125/86   Exam: Normal Speech.  NAD    Lab and Radiology Results No results found for this or any previous visit (from the past 72 hour(s)). No results found.     Assessment and Plan: 38 y.o. female with The encounter diagnosis was Acute lower respiratory infection.   PDMP not reviewed this encounter. No orders of the defined types were placed in this encounter.  Meds ordered this encounter  Medications  . budesonide (PULMICORT) 1 MG/2ML nebulizer solution    Sig: Take 2 mLs (1 mg total) by nebulization 2 (two) times daily.    Dispense:  20 mL    Refill:  0  . amitriptyline (ELAVIL) 10 MG tablet    Sig: Take 1-2 tablets (10-20 mg total) by mouth at bedtime.    Dispense:  60 tablet    Refill:  1   Patient Instructions  Plan: Let us try the amitriptyline around bedtime, hopefully this will help calm the GI issues as well as treat anxiety/depression.  Let  us give it a couple weeks on this medication and see how you do, but definitely let me know ASAP if any concerning side effects, or if any other questions or concerns.  Reminder in the chart to reach out to you by phone in a couple of weeks, please let us know if you do not hear back!     Instructions sent via MyChart. If MyChart not available, pt was given option for info via personal e-mail w/ no guarantee of protected health info over unsecured e-mail communication, and MyChart sign-up instructions were sent to patient.   Follow Up Instructions: Return in about 2 weeks (around 11/15/2019) for PHONE VISIT - RECHECK ON NEW MEDICATION ELAVIL FOR ANXIETY/DEPRESSION AND GI ISSUES .    I discussed the assessment and treatment plan with the patient. The patient was provided an opportunity to ask  questions and all were answered. The patient agreed with the plan and demonstrated an understanding of the instructions.   The patient was advised to call back or seek an in-person evaluation if any new concerns, if symptoms worsen or if the condition fails to improve as anticipated.  30 minutes of non-face-to-face time was provided during this encounter.      . . . . . . . . . . . . . Marland Kitchen                   Historical information moved to improve visibility of documentation.  Past Medical History:  Diagnosis Date  . Anemia    history  . Bartholin cyst 12/27/2011  . Celiac disease   . Collagenous colitis   . Delivery of twins, both live 12/02/2012  . Dichorionic diamniotic twin pregnancy 12/01/2012  . Headache(784.0)    complex migraines with cardiac and neuro symptoms during pregnancy  . Hypothyroidism   . Infertility, female   . SVD (spontaneous vaginal delivery) 12/02/2012  . Thyroid disease    Past Surgical History:  Procedure Laterality Date  . BARTHOLIN CYST MARSUPIALIZATION  12/28/2011   Procedure: BARTHOLIN CYST MARSUPIALIZATION;  Surgeon: Sherron Monday, MD;  Location: [facility];  Service: Gynecology;  Laterality: N/A;  . COLONOSCOPY    . RHINOPLASTY    . TONSILLECTOMY    . UPPER GASTROINTESTINAL ENDOSCOPY    . WISDOM TOOTH EXTRACTION     Social History   Tobacco Use  . Smoking status: Never Smoker  . Smokeless tobacco: Never Used  Substance Use Topics  . Alcohol use: No    Comment: socially when not pregnant   family history includes Clotting disorder in her paternal uncle; Heart attack in her maternal grandmother; Rashes / Skin problems in her paternal grandmother.  Medications: Current Outpatient Medications  Medication Sig Dispense Refill  . ALPRAZolam (XANAX) 0.25 MG tablet Take 1 tablet (0.25 mg total) by mouth daily as needed for anxiety (use sparingly to avoid dependence). 30 tablet 0  . betamethasone dipropionate (DIPROLENE)  0.05 % cream betamethasone dipropionate 0.05 % topical cream  APPLY A THIN LAYER TO THE AFFECTED AREA(S) BY TOPICAL ROUTE ONCE DAILY 30 g 0  . budesonide (PULMICORT) 1 MG/2ML nebulizer solution Take 2 mLs (1 mg total) by nebulization 2 (two) times daily. 20 mL 0  . Budesonide ER 9 MG TB24 Take by mouth.    . diphenoxylate-atropine (LOMOTIL) 2.5-0.025 MG tablet Take daily as needed by mouth.    . levothyroxine (UNITHROID) 137 MCG tablet Take 1 tablet (137 mcg total) by mouth daily before breakfast. 90 tablet  3  . ondansetron (ZOFRAN) 4 MG tablet Take 4 mg by mouth as needed.    Marland Kitchen. amitriptyline (ELAVIL) 10 MG tablet Take 1-2 tablets (10-20 mg total) by mouth at bedtime. 60 tablet 1   No current facility-administered medications for this visit.    Allergies  Allergen Reactions  . Rifaximin Itching and Other (See Comments)    Reaction:  Itching of throat  . Gluten Meal Diarrhea, Nausea And Vomiting and Other (See Comments)    Pt has celiac disease  . Amoxicillin-Pot Clavulanate Other (See Comments) and Hives    Itching throat - amoxicilin alone doesn't cause any problems   . Clavulanic Acid Other (See Comments)    Flushing  . Promethazine Swelling  . Tessalon [Benzonatate] Other (See Comments)    Lump on throat  . Xifaxan [Rifaximin]   . Dextromethorphan Other (See Comments)  . Levothyroxine Sodium Diarrhea    Only has GI reaction to synthroid brand

## 2019-11-15 ENCOUNTER — Telehealth: Payer: Self-pay | Admitting: Osteopathic Medicine

## 2019-11-15 NOTE — Telephone Encounter (Addendum)
Elavil started 11/01/19 for GI issues / mental health. How is it working?

## 2019-11-15 NOTE — Telephone Encounter (Signed)
Left a vm msg for pt regarding provider's note. Aware to return a call back with an update on how she is doing on the med. Direct call back info provided.

## 2019-12-12 ENCOUNTER — Encounter: Payer: Self-pay | Admitting: Osteopathic Medicine

## 2020-03-05 DIAGNOSIS — G43709 Chronic migraine without aura, not intractable, without status migrainosus: Secondary | ICD-10-CM | POA: Diagnosis not present

## 2020-03-18 ENCOUNTER — Encounter: Payer: Self-pay | Admitting: Medical-Surgical

## 2020-06-19 IMAGING — DX DG ABDOMEN 1V
1 series · 1 of 1 positions shown · non-contrast
Comparison: Abdomen and pelvis CT dated 05/24/2010.

CLINICAL DATA: Right flank pain for the past 1.5 hours.

EXAM:
ABDOMEN - 1 VIEW

[abdomen kub]
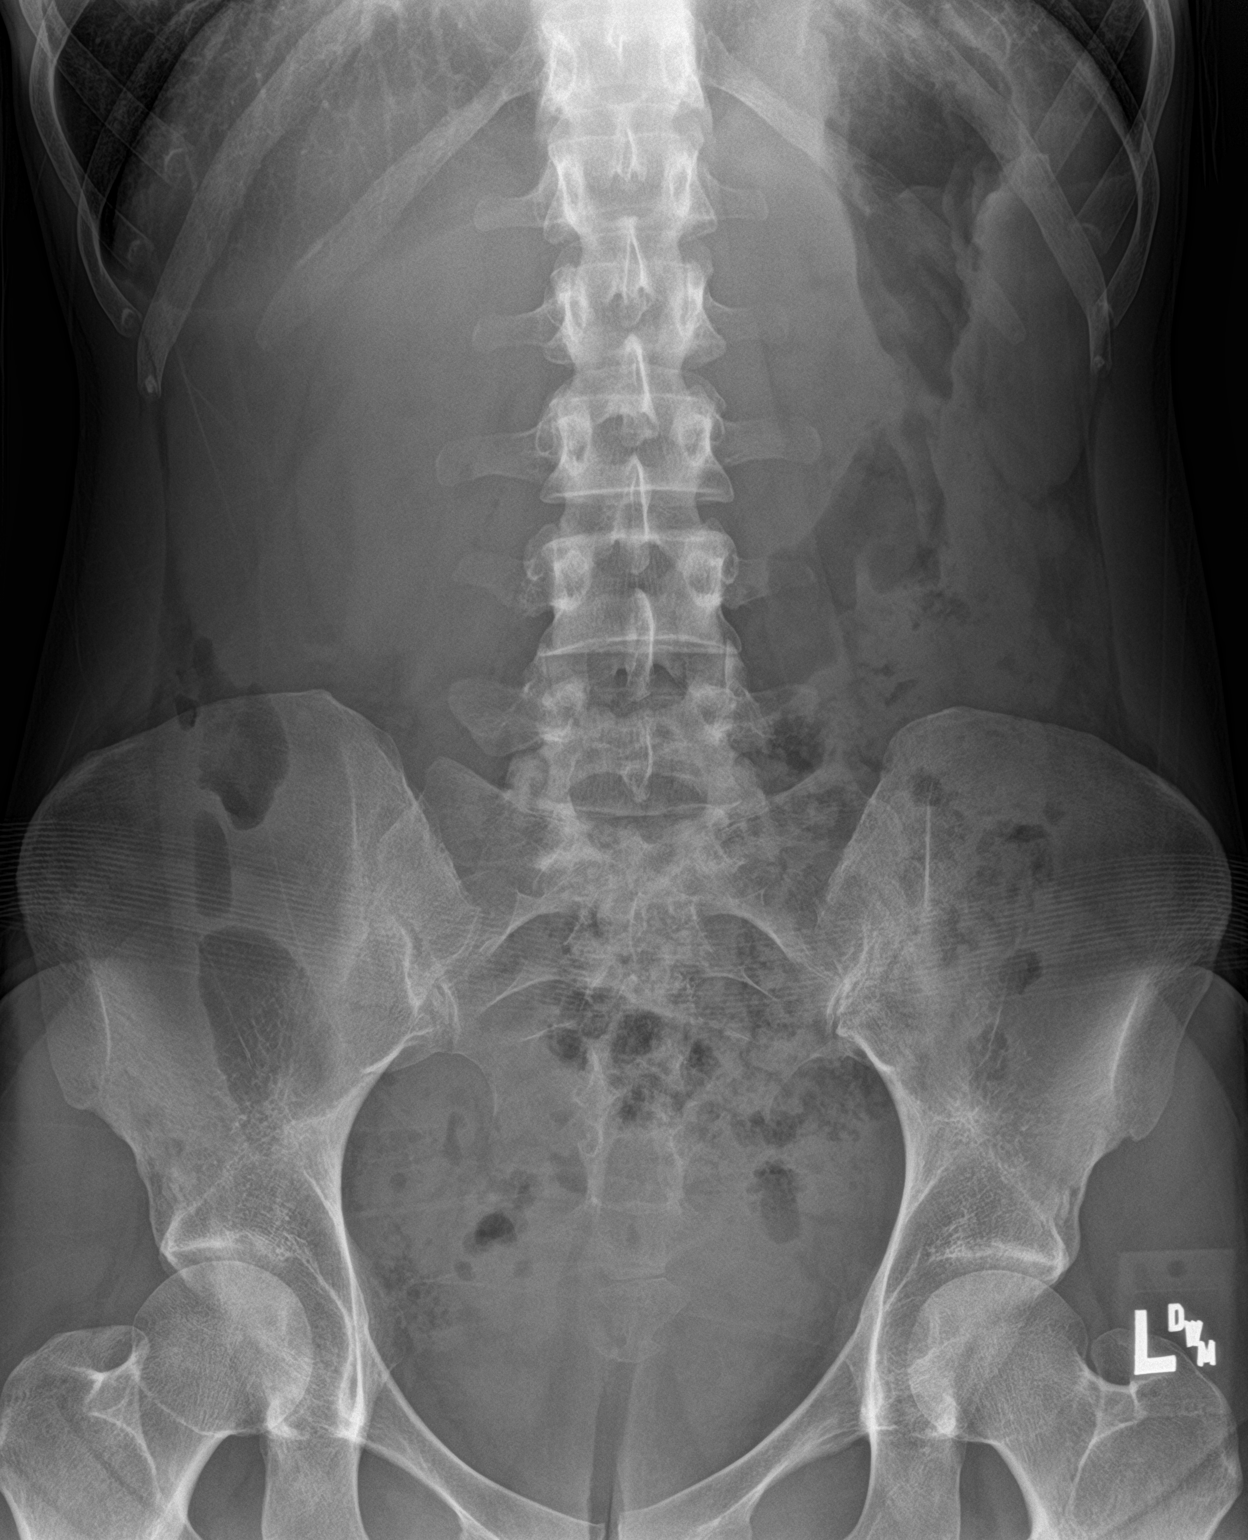

[1 of 1 positions shown; findings below may reference images not displayed]

FINDINGS: Normal bowel gas pattern. No calcified urinary tract calculi seen.
Unremarkable bones.
IMPRESSION: Normal examination.

## 2020-07-07 ENCOUNTER — Ambulatory Visit: Payer: BC Managed Care – PPO | Admitting: Osteopathic Medicine

## 2020-07-07 ENCOUNTER — Encounter: Payer: Self-pay | Admitting: Osteopathic Medicine

## 2020-07-07 ENCOUNTER — Other Ambulatory Visit (HOSPITAL_COMMUNITY)
Admission: RE | Admit: 2020-07-07 | Discharge: 2020-07-07 | Disposition: A | Discharge status: Home / Self Care | Payer: Managed Care, Other (non HMO) | Source: Ambulatory Visit | Attending: Osteopathic Medicine | Admitting: Osteopathic Medicine

## 2020-07-07 ENCOUNTER — Ambulatory Visit: Payer: Managed Care, Other (non HMO) | Admitting: Osteopathic Medicine

## 2020-07-07 VITALS — BP 106/74 | HR 88 | Wt 109.0 lb

## 2020-07-07 DIAGNOSIS — K900 Celiac disease: Secondary | ICD-10-CM

## 2020-07-07 DIAGNOSIS — E039 Hypothyroidism, unspecified: Secondary | ICD-10-CM

## 2020-07-07 DIAGNOSIS — F419 Anxiety disorder, unspecified: Secondary | ICD-10-CM

## 2020-07-07 DIAGNOSIS — F329 Major depressive disorder, single episode, unspecified: Secondary | ICD-10-CM

## 2020-07-07 DIAGNOSIS — Z862 Personal history of diseases of the blood and blood-forming organs and certain disorders involving the immune mechanism: Secondary | ICD-10-CM

## 2020-07-07 DIAGNOSIS — Z124 Encounter for screening for malignant neoplasm of cervix: Secondary | ICD-10-CM | POA: Insufficient documentation

## 2020-07-07 DIAGNOSIS — Z0000 Encounter for general adult medical examination without abnormal findings: Secondary | ICD-10-CM | POA: Insufficient documentation

## 2020-07-07 DIAGNOSIS — F32A Depression, unspecified: Secondary | ICD-10-CM

## 2020-07-07 NOTE — Patient Instructions (Addendum)
General Preventive Care  Most recent routine screening labs: ordered today.  Blood pressure goal 130/80 or less.   Tobacco: don't!  Alcohol: responsible moderation is ok for most adults - if you have concerns about your alcohol intake, please talk to me!   Exercise: as tolerated to reduce risk of cardiovascular disease and diabetes. Strength training will also prevent osteoporosis.   Mental health: if need for mental health care (medicines, counseling, other), or concerns about moods, please let me know!   Sexual / Reproductive health: if need for STD testing, or if concerns with libido/pain problems, please let me know! If you need to discuss family planning, please let me know!   Advanced Directive: Living Will and/or Healthcare Power of Attorney recommended for all adults, regardless of age or health.  Vaccines  Flu vaccine: for almost everyone, every fall.   Shingles vaccine: after age 66.   Pneumonia vaccines: recommended for Celiac patients - please discuss with your GI physician   Tetanus booster: every 10 years - due 2026. Routine boosters also given 3rd trimester pregnancy to protect infant.   HPV vaccine: Gardasil up to age 50   COVID vaccine: THANKS for getting your vaccine! :) / STRONGLY RECOMMENDED  Cancer screenings   Colon cancer screening: for everyone age 68-75. Colonoscopy available for all, many people also qualify for the Cologuard stool test   Breast cancer screening: mammogram at age 75  Cervical cancer screening: Pap every 1 to 5 years depending on age and other risk factors. Can usually stop at age 52 or w/ hysterectomy.   Lung cancer screening: not needed for non-smokers  Infection screenings  . HIV: recommended screening at least once age 22-65 . Gonorrhea/Chlamydia: screening as needed . Hepatitis C: recommended once for everyone age 63-75 . TB: certain at-risk populations, or depending on work requirements and/or travel history Other . Bone  Density Test: recommended for women at age 54

## 2020-07-07 NOTE — Progress Notes (Signed)
Emily [name] is a 39 y.o. female who presents to  [facility] at [facility]  today, 07/07/20, seeking care for the following:  . Pap / Annual  . Otherwise doing well!      ASSESSMENT & PLAN with other pertinent findings:  The primary encounter diagnosis was Annual physical exam. Diagnoses of Cervical cancer screening, Hypothyroidism, unspecified type, Celiac disease/sprue, History of anemia, and Anxiety and depression were also pertinent to this visit.      Patient Instructions  General Preventive Care  Most recent routine screening labs: ordered today.  Blood pressure goal 130/80 or less.   Tobacco: don't!  Alcohol: responsible moderation is ok for most adults - if you have concerns about your alcohol intake, please talk to me!   Exercise: as tolerated to reduce risk of cardiovascular disease and diabetes. Strength training will also prevent osteoporosis.   Mental health: if need for mental health care (medicines, counseling, other), or concerns about moods, please let me know!   Sexual / Reproductive health: if need for STD testing, or if concerns with libido/pain problems, please let me know! If you need to discuss family planning, please let me know!   Advanced Directive: Living Will and/or Healthcare Power of Attorney recommended for all adults, regardless of age or health.  Vaccines  Flu vaccine: for almost everyone, every fall.   Shingles vaccine: after age 80.   Pneumonia vaccines: recommended for Celiac patients - please discuss with your GI physician   Tetanus booster: every 10 years - due 2026. Routine boosters also given 3rd trimester pregnancy to protect infant.   HPV vaccine: Gardasil up to age 37   COVID vaccine: THANKS for getting your vaccine! :) / STRONGLY RECOMMENDED  Cancer screenings   Colon cancer screening: for everyone age 12-75. Colonoscopy available for all, many people also qualify for the  Cologuard stool test   Breast cancer screening: mammogram at age 56  Cervical cancer screening: Pap every 1 to 5 years depending on age and other risk factors. Can usually stop at age 58 or w/ hysterectomy.   Lung cancer screening: not needed for non-smokers  Infection screenings  . HIV: recommended screening at least once age 17-65 . Gonorrhea/Chlamydia: screening as needed . Hepatitis C: recommended once for everyone age 63-75 . TB: certain at-risk populations, or depending on work requirements and/or travel history Other . Bone Density Test: recommended for women at age 11   Orders Placed This Encounter  Procedures  . CBC  . COMPLETE METABOLIC PANEL WITH GFR  . Lipid panel  . Iron and TIBC  . Vitamin B12  . Folate RBC  . VITAMIN D 25 Hydroxy (Vit-D Deficiency, Fractures)  . TSH    No orders of the defined types were placed in this encounter.  Constitutional:  . VSS, see nurse notes . General Appearance: alert, well-developed, well-nourished, NAD Eyes: Marland Kitchen Normal lids and conjunctive, non-icteric sclera Neck: . No masses, trachea midline Respiratory: . Normal respiratory effort . Breath sounds normal, no wheeze/rhonchi/rales Cardiovascular: . S1/S2 normal, no murmur/rub/gallop auscultated . No lower extremity edema Musculoskeletal:  . Gait normal . No clubbing/cyanosis of digits Neurological: . No cranial nerve deficit on limited exam . Motor and sensation intact and symmetric Psychiatric: . Normal judgment/insight . Normal mood and affect GYN: No lesions/ulcers to external genitalia, normal urethra, normal vaginal mucosa, physiologic discharge, cervix normal without lesions, uterus not enlarged or tender, adnexa no masses and nontender  Follow-up instructions: Return in about 1 year (around 07/07/2021) for [place] (call week prior to visit for lab orders).                                         BP 106/74 (BP  Location: Left Arm, Patient Position: Sitting)   Pulse 88   Wt 109 lb (49.4 kg)   LMP 06/30/2020 (Exact Date)   SpO2 99%   BMI 17.59 kg/m   Current Meds  Medication Sig  . ALPRAZolam (XANAX) 0.25 MG tablet Take 1 tablet (0.25 mg total) by mouth daily as needed for anxiety (use sparingly to avoid dependence).  . betamethasone dipropionate (DIPROLENE) 0.05 % cream betamethasone dipropionate 0.05 % topical cream  APPLY A THIN LAYER TO THE AFFECTED AREA(S) BY TOPICAL ROUTE ONCE DAILY  . budesonide (PULMICORT) 1 MG/2ML nebulizer solution Take 2 mLs (1 mg total) by nebulization 2 (two) times daily.  . Budesonide ER 9 MG TB24 Take by mouth.  . diphenoxylate-atropine (LOMOTIL) 2.5-0.025 MG tablet Take daily as needed by mouth.  . levothyroxine (UNITHROID) 137 MCG tablet Take 1 tablet (137 mcg total) by mouth daily before breakfast.  . ondansetron (ZOFRAN) 4 MG tablet Take 4 mg by mouth as needed.    No results found for this or any previous visit (from the past 72 hour(s)).  No results found.     All questions at time of visit were answered - patient instructed to contact office with any additional concerns or updates.  ER/RTC precautions were reviewed with the patient as applicable.   Please note: voice recognition software was used to produce this document, and typos may escape review. Please contact Dr. Lyn Hollingshead for any needed clarifications.

## 2020-07-08 LAB — COMPLETE METABOLIC PANEL WITH GFR
AG Ratio: 2.6 (calc) — ABNORMAL HIGH (ref 1.0–2.5)
ALT: 10 U/L (ref 6–29)
AST: 14 U/L (ref 10–30)
Albumin: 4.6 g/dL (ref 3.6–5.1)
Alkaline phosphatase (APISO): 30 U/L — ABNORMAL LOW (ref 31–125)
BUN: 12 mg/dL (ref 7–25)
CO2: 24 mmol/L (ref 20–32)
Calcium: 8.9 mg/dL (ref 8.6–10.2)
Chloride: 106 mmol/L (ref 98–110)
Creat: 0.76 mg/dL (ref 0.50–1.10)
GFR, Est African American: 116 mL/min/{1.73_m2} (ref >=60)
GFR, Est Non African American: 100 mL/min/{1.73_m2} (ref >=60)
Globulin: 1.8 g/dL (calc) — ABNORMAL LOW (ref 1.9–3.7)
Glucose, Bld: 88 mg/dL (ref 65–139)
Potassium: 4.4 mmol/L (ref 3.5–5.3)
Sodium: 137 mmol/L (ref 135–146)
Total Bilirubin: 0.6 mg/dL (ref 0.2–1.2)
Total Protein: 6.4 g/dL (ref 6.1–8.1)

## 2020-07-08 LAB — FOLATE RBC: RBC Folate: 468 ng/mL RBC (ref >280)

## 2020-07-08 LAB — CBC
HCT: 40.4 % (ref 35.0–45.0)
Hemoglobin: 13.5 g/dL (ref 11.7–15.5)
MCH: 29.9 pg (ref 27.0–33.0)
MCHC: 33.4 g/dL (ref 32.0–36.0)
MCV: 89.4 fL (ref 80.0–100.0)
MPV: 12.2 fL (ref 7.5–12.5)
Platelets: 162 10*3/uL (ref 140–400)
RBC: 4.52 10*6/uL (ref 3.80–5.10)
RDW: 11.7 % (ref 11.0–15.0)
WBC: 4.6 10*3/uL (ref 3.8–10.8)

## 2020-07-08 LAB — LIPID PANEL
Cholesterol: 164 mg/dL (ref <200)
HDL: 52 mg/dL (ref >=50)
LDL Cholesterol (Calc): 98 mg/dL (calc)
Non-HDL Cholesterol (Calc): 112 mg/dL (calc) (ref <130)
Total CHOL/HDL Ratio: 3.2 (calc) (ref <5.0)
Triglycerides: 57 mg/dL (ref <150)

## 2020-07-08 LAB — IRON, TOTAL/TOTAL IRON BINDING CAP: Iron: 123 ug/dL (ref 40–190)

## 2020-07-08 LAB — VITAMIN D 25 HYDROXY (VIT D DEFICIENCY, FRACTURES): Vit D, 25-Hydroxy: 26 ng/mL — ABNORMAL LOW (ref 30–100)

## 2020-07-08 LAB — VITAMIN B12: Vitamin B-12: 426 pg/mL (ref 200–1100)

## 2020-07-08 LAB — TSH: TSH: 0.87 mIU/L

## 2020-07-09 LAB — CYTOLOGY - PAP
Comment: NEGATIVE
Diagnosis: NEGATIVE
High risk HPV: NEGATIVE

## 2020-07-13 ENCOUNTER — Encounter: Payer: Self-pay | Admitting: Osteopathic Medicine

## 2020-07-16 ENCOUNTER — Other Ambulatory Visit: Payer: Self-pay | Admitting: Osteopathic Medicine

## 2020-08-12 ENCOUNTER — Ambulatory Visit: Payer: Managed Care, Other (non HMO) | Admitting: Family Medicine

## 2020-08-12 ENCOUNTER — Encounter: Payer: Self-pay | Admitting: Family Medicine

## 2020-08-12 VITALS — BP 118/87 | HR 96 | Wt 110.0 lb

## 2020-08-12 DIAGNOSIS — R309 Painful micturition, unspecified: Secondary | ICD-10-CM

## 2020-08-12 DIAGNOSIS — N3090 Cystitis, unspecified without hematuria: Secondary | ICD-10-CM

## 2020-08-12 LAB — POCT URINALYSIS DIP (CLINITEK)
Bilirubin, UA: NEGATIVE
Blood, UA: NEGATIVE
Glucose, UA: NEGATIVE mg/dL
Ketones, POC UA: NEGATIVE mg/dL
Leukocytes, UA: NEGATIVE
Nitrite, UA: NEGATIVE
POC PROTEIN,UA: NEGATIVE
Spec Grav, UA: 1.025 (ref 1.010–1.025)
Urobilinogen, UA: 0.2 E.U./dL
pH, UA: 7 (ref 5.0–8.0)

## 2020-08-12 MED ORDER — NITROFURANTOIN MONOHYD MACRO 100 MG PO CAPS
100.0000 mg | ORAL_CAPSULE | Freq: Two times a day (BID) | ORAL | 0 refills | Status: AC
Start: 2020-08-12 — End: 2020-08-19

## 2020-08-12 NOTE — Progress Notes (Signed)
Emily [name] - 38 y.o. female MRN [id number]  Date of birth: 10/29/1981  Subjective Chief Complaint  Patient presents with  . Urinary Tract Infection    HPI Emily [name] is a 39 y.o. female here today with 3 day history of dysuria, frequency, urgency and suprapubic pressure.  She denies flank pain, fever, chills, hematuria, vaginal discharge or irritation.  She has not tried anything at home for management of her symptoms.   ROS:  A comprehensive ROS was completed and negative except as noted per HPI  Allergies  Allergen Reactions  . Rifaximin Itching and Other (See Comments)    Reaction:  Itching of throat  . Gluten Meal Diarrhea, Nausea And Vomiting and Other (See Comments)    Pt has celiac disease  . Amoxicillin-Pot Clavulanate Other (See Comments) and Hives    Itching throat - amoxicilin alone doesn't cause any problems   . Clavulanic Acid Other (See Comments)    Flushing  . Promethazine Swelling  . Tessalon [Benzonatate] Other (See Comments)    Lump on throat  . Xifaxan [Rifaximin]   . Dextromethorphan Other (See Comments)  . Levothyroxine Sodium Diarrhea    Only has GI reaction to synthroid brand    Past Medical History:  Diagnosis Date  . Anemia    history  . Bartholin cyst 12/27/2011  . Celiac disease   . Collagenous colitis   . Delivery of twins, both live 12/02/2012  . Dichorionic diamniotic twin pregnancy 12/01/2012  . Headache(784.0)    complex migraines with cardiac and neuro symptoms during pregnancy  . Hypothyroidism   . Infertility, female   . SVD (spontaneous vaginal delivery) 12/02/2012  . Thyroid disease     Past Surgical History:  Procedure Laterality Date  . BARTHOLIN CYST MARSUPIALIZATION  12/28/2011   Procedure: BARTHOLIN CYST MARSUPIALIZATION;  Surgeon: Sherron Monday, MD;  Location: [facility];  Service: Gynecology;  Laterality: N/A;  . COLONOSCOPY    . RHINOPLASTY    . TONSILLECTOMY    . UPPER GASTROINTESTINAL ENDOSCOPY    . WISDOM TOOTH  EXTRACTION      Social History   Socioeconomic History  . Marital status: Married    Spouse name: Not on file  . Number of children: Not on file  . Years of education: Not on file  . Highest education level: Not on file  Occupational History  . Not on file  Tobacco Use  . Smoking status: Never Smoker  . Smokeless tobacco: Never Used  Substance and Sexual Activity  . Alcohol use: No    Comment: socially when not pregnant  . Drug use: No  . Sexual activity: Not Currently    Birth control/protection: None  Other Topics Concern  . Not on file  Social History Narrative  . Not on file   Social Determinants of Health   Financial Resource Strain:   . Difficulty of Paying Living Expenses: Not on file  Food Insecurity:   . Worried About Programme researcher, broadcasting/film/video in the Last Year: Not on file  . Ran Out of Food in the Last Year: Not on file  Transportation Needs:   . Lack of Transportation (Medical): Not on file  . Lack of Transportation (Non-Medical): Not on file  Physical Activity:   . Days of Exercise per Week: Not on file  . Minutes of Exercise per Session: Not on file  Stress:   . Feeling of Stress : Not on file  Social Connections:   . Frequency of Communication  with Friends and Family: Not on file  . Frequency of Social Gatherings with Friends and Family: Not on file  . Attends Religious Services: Not on file  . Active Member of Clubs or Organizations: Not on file  . Attends Banker Meetings: Not on file  . Marital Status: Not on file    Family History  Problem Relation Age of Onset  . Heart attack Maternal Grandmother   . Rashes / Skin problems Paternal Grandmother   . Clotting disorder Paternal Uncle     Health Maintenance  Topic Date Due  . Hepatitis C Screening  Never done  . INFLUENZA VACCINE  07/12/2020  . PAP SMEAR-Modifier  07/07/2025  . TETANUS/TDAP  10/27/2025  . COVID-19 Vaccine  Completed  . HIV Screening  Completed      ----------------------------------------------------------------------------------------------------------------------------------------------------------------------------------------------------------------- Physical Exam BP 118/87 (BP Location: Left Arm, Patient Position: Sitting, Cuff Size: Small)   Pulse 96   Wt 110 lb (49.9 kg)   SpO2 100%   BMI 17.75 kg/m   Physical Exam Constitutional:      Appearance: Normal appearance.  Cardiovascular:     Rate and Rhythm: Normal rate and regular rhythm.  Abdominal:     General: Abdomen is flat. There is no distension.     Tenderness: There is no abdominal tenderness.  Musculoskeletal:     Cervical back: Neck supple.  Neurological:     Mental Status: She is alert.  Psychiatric:        Mood and Affect: Mood normal.        Behavior: Behavior normal.     ------------------------------------------------------------------------------------------------------------------------------------------------------------------------------------------------------------------- Assessment and Plan  Cystitis UA normal however she provides a really good history for UTI.  Urine sent for culture and will empirically treat with macrobid until culture returns.  She is instructed to contact the clinic if she develops any new or worsening symptoms.       Meds ordered this encounter  Medications  . nitrofurantoin, macrocrystal-monohydrate, (MACROBID) 100 MG capsule    Sig: Take 1 capsule (100 mg total) by mouth 2 (two) times daily for 7 days.    Dispense:  14 capsule    Refill:  0    No follow-ups on file.    This visit occurred during the SARS-CoV-2 public health emergency.  Safety protocols were in place, including screening questions prior to the visit, additional usage of staff PPE, and extensive cleaning of exam room while observing appropriate contact time as indicated for disinfecting solutions.

## 2020-08-12 NOTE — Patient Instructions (Signed)
Urinary Tract Infection, Adult A urinary tract infection (UTI) is an infection of any part of the urinary tract. The urinary tract includes:  The kidneys.  The ureters.  The bladder.  The urethra. These organs make, store, and get rid of pee (urine) in the body. What are the causes? This is caused by germs (bacteria) in your genital area. These germs grow and cause swelling (inflammation) of your urinary tract. What increases the risk? You are more likely to develop this condition if:  You have a small, thin tube (catheter) to drain pee.  You cannot control when you pee or poop (incontinence).  You are female, and: ? You use these methods to prevent pregnancy:  A medicine that kills sperm (spermicide).  A device that blocks sperm (diaphragm). ? You have low levels of a female hormone (estrogen). ? You are pregnant.  You have genes that add to your risk.  You are sexually active.  You take antibiotic medicines.  You have trouble peeing because of: ? A prostate that is bigger than normal, if you are female. ? A blockage in the part of your body that drains pee from the bladder (urethra). ? A kidney stone. ? A nerve condition that affects your bladder (neurogenic bladder). ? Not getting enough to drink. ? Not peeing often enough.  You have other conditions, such as: ? Diabetes. ? A weak disease-fighting system (immune system). ? Sickle cell disease. ? Gout. ? Injury of the spine. What are the signs or symptoms? Symptoms of this condition include:  Needing to pee right away (urgently).  Peeing often.  Peeing small amounts often.  Pain or burning when peeing.  Blood in the pee.  Pee that smells bad or not like normal.  Trouble peeing.  Pee that is cloudy.  Fluid coming from the vagina, if you are female.  Pain in the belly or lower back. Other symptoms include:  Throwing up (vomiting).  No urge to eat.  Feeling mixed up (confused).  Being tired  and grouchy (irritable).  A fever.  Watery poop (diarrhea). How is this treated? This condition may be treated with:  Antibiotic medicine.  Other medicines.  Drinking enough water. Follow these instructions at home:  Medicines  Take over-the-counter and prescription medicines only as told by your doctor.  If you were prescribed an antibiotic medicine, take it as told by your doctor. Do not stop taking it even if you start to feel better. General instructions  Make sure you: ? Pee until your bladder is empty. ? Do not hold pee for a long time. ? Empty your bladder after sex. ? Wipe from front to back after pooping if you are a female. Use each tissue one time when you wipe.  Drink enough fluid to keep your pee pale yellow.  Keep all follow-up visits as told by your doctor. This is important. Contact a doctor if:  You do not get better after 1-2 days.  Your symptoms go away and then come back. Get help right away if:  You have very bad back pain.  You have very bad pain in your lower belly.  You have a fever.  You are sick to your stomach (nauseous).  You are throwing up. Summary  A urinary tract infection (UTI) is an infection of any part of the urinary tract.  This condition is caused by germs in your genital area.  There are many risk factors for a UTI. These include having a small, thin   tube to drain pee and not being able to control when you pee or poop.  Treatment includes antibiotic medicines for germs.  Drink enough fluid to keep your pee pale yellow. This information is not intended to replace advice given to you by your health care provider. Make sure you discuss any questions you have with your health care provider. Document Revised: 11/15/2018 Document Reviewed: 06/07/2018 Elsevier Patient Education  2020 [employer]  

## 2020-08-12 NOTE — Assessment & Plan Note (Signed)
UA normal however she provides a really good history for UTI.  Urine sent for culture and will empirically treat with macrobid until culture returns.  She is instructed to contact the clinic if she develops any new or worsening symptoms.

## 2020-08-14 LAB — URINE CULTURE
MICRO NUMBER:: 10899779
SPECIMEN QUALITY:: ADEQUATE

## 2020-08-16 ENCOUNTER — Encounter: Payer: Self-pay | Admitting: Family Medicine

## 2020-08-16 ENCOUNTER — Other Ambulatory Visit: Payer: Self-pay | Admitting: Family Medicine

## 2020-08-16 MED ORDER — CEPHALEXIN 500 MG PO CAPS
500.0000 mg | ORAL_CAPSULE | Freq: Two times a day (BID) | ORAL | 0 refills | Status: DC
Start: 2020-08-16 — End: 2020-08-18

## 2020-08-18 MED ORDER — CEPHALEXIN 250 MG/5ML PO SUSR: 500.0000 mg | Freq: Two times a day (BID) | ORAL | 0 refills | Status: AC

## 2020-08-18 NOTE — Telephone Encounter (Signed)
Patient was given cephalexin 500 mg capsules, 1 BID x 7 days  Patient requesting RX be sent in for liquid instead of capsules. Pended RX for suspension  Please review

## 2020-10-29 ENCOUNTER — Encounter: Payer: Self-pay | Admitting: Osteopathic Medicine

## 2021-01-11 ENCOUNTER — Telehealth: Payer: Managed Care, Other (non HMO) | Admitting: Osteopathic Medicine

## 2021-05-14 ENCOUNTER — Other Ambulatory Visit: Payer: Self-pay

## 2021-05-14 ENCOUNTER — Emergency Department
Admission: EM | Admit: 2021-05-14 | Discharge: 2021-05-14 | Disposition: A | Discharge status: Home / Self Care | Payer: Managed Care, Other (non HMO) | Source: Home / Self Care

## 2021-05-14 ENCOUNTER — Emergency Department (INDEPENDENT_AMBULATORY_CARE_PROVIDER_SITE_OTHER): Payer: Managed Care, Other (non HMO)

## 2021-05-14 DIAGNOSIS — M436 Torticollis: Secondary | ICD-10-CM | POA: Diagnosis not present

## 2021-05-14 DIAGNOSIS — M542 Cervicalgia: Secondary | ICD-10-CM

## 2021-05-14 DIAGNOSIS — M62838 Other muscle spasm: Secondary | ICD-10-CM | POA: Diagnosis not present

## 2021-05-14 MED ORDER — TRAMADOL HCL 50 MG PO TABS: 50.0000 mg | ORAL_TABLET | Freq: Four times a day (QID) | ORAL | 0 refills | Status: DC | PRN

## 2021-05-14 MED ORDER — METAXALONE 800 MG PO TABS: 800.0000 mg | ORAL_TABLET | Freq: Two times a day (BID) | ORAL | 0 refills | Status: DC

## 2021-05-14 MED ORDER — CYCLOBENZAPRINE HCL 10 MG PO TABS: ORAL_TABLET | ORAL | 0 refills | Status: DC

## 2021-05-14 NOTE — ED Triage Notes (Signed)
Pt c/o neck pain since Tuesday am. Worsening each day. Can't turn head, hurts to swallow. Heat and ice prn. Tylenol with no relief. No hx of neck issues. Pain 9/10

## 2021-05-14 NOTE — ED Provider Notes (Signed)
[name]    CSN: [id number] Arrival date & time: 05/14/21  1102      History   Chief Complaint Chief Complaint  Patient presents with  . Neck Pain    HPI Emily [name] is a 40 y.o. female who presents with neck pain x 3 days. Getting gradually worse. Pain provoked with turning her head and swallow. Has tried heat and ice and tylenol with no relief. Denies hx of neck problems in the past. Denies URI or fever  HPI  Past Medical History:  Diagnosis Date  . Anemia    history  . Bartholin cyst 12/27/2011  . Celiac disease   . Collagenous colitis   . Delivery of twins, both live 12/02/2012  . Dichorionic diamniotic twin pregnancy 12/01/2012  . Headache(784.0)    complex migraines with cardiac and neuro symptoms during pregnancy  . Hypothyroidism   . Infertility, female   . SVD (spontaneous vaginal delivery) 12/02/2012  . Thyroid disease     Patient Active Problem List   Diagnosis Date Noted  . Cystitis 08/12/2020  . Celiac disease/sprue 09/06/2019  . GI problem 03/13/2019  . Amenorrhea 11/28/2018  . Candidiasis of vagina 11/28/2018  . Candidiasis of skin 11/28/2018  . Chronic colitis 11/28/2018  . History of miscarriage 11/28/2018  . Irregular periods 11/28/2018  . Missed abortion 11/28/2018  . Nausea 11/28/2018  . Pregnancy with isoimmunization 11/28/2018  . Vaginal discharge 11/28/2018  . Palpitations 11/16/2018  . PVC (premature ventricular contraction) 11/16/2018  . PAC (premature atrial contraction) 11/16/2018  . Sinus tachycardia seen on cardiac monitor 04/06/2018  . History of colonoscopy 03/31/2016  . Cervical cancer screening 03/31/2016  . History of celiac disease 03/31/2016  . Hypothyroid 03/31/2016  . Anxiety and depression 03/31/2016  . History of anemia 03/31/2016  . Lipid screening 03/31/2016  . Medication management 03/31/2016  . Encounter for contraceptive management 03/31/2016  . Tachycardia with heart rate 100-120 beats per  minute 11/13/2012  . Hypokalemia 11/13/2012  . Disturbance of skin sensation 10/07/2012  . Complicated migraine 10/07/2012  . Bartholin cyst 12/27/2011  . Female infertility 10/24/2011    Past Surgical History:  Procedure Laterality Date  . BARTHOLIN CYST MARSUPIALIZATION  12/28/2011   Procedure: BARTHOLIN CYST MARSUPIALIZATION;  Surgeon: Sherron Monday, MD;  Location: [facility];  Service: Gynecology;  Laterality: N/A;  . COLONOSCOPY    . RHINOPLASTY    . TONSILLECTOMY    . UPPER GASTROINTESTINAL ENDOSCOPY    . WISDOM TOOTH EXTRACTION      OB History    Gravida  3   Para  2   Term  2   Preterm      AB      Living  3     SAB      IAB      Ectopic      Multiple  1   Live Births  3            Home Medications    Prior to Admission medications   Medication Sig Start Date End Date Taking? Authorizing Provider  cyclobenzaprine (FLEXERIL) 10 MG tablet 1/2 -1 qhs for muscle spasm 05/14/21  Yes Rodriguez-Southworth, Nettie Elm, PA-C  metaxalone (SKELAXIN) 800 MG tablet Take 1 tablet (800 mg total) by mouth in the morning and at bedtime. For day time muscle spasm 05/14/21  Yes Rodriguez-Southworth, Nettie Elm, PA-C  traMADol (ULTRAM) 50 MG tablet Take 1 tablet (50 mg total) by mouth every 6 (six) hours as needed.  05/14/21  Yes Rodriguez-Southworth, Nettie Elm, PA-C  ALPRAZolam (XANAX) 0.25 MG tablet Take 1 tablet (0.25 mg total) by mouth daily as needed for anxiety (use sparingly to avoid dependence). 03/23/18   Sunnie Nielsen, DO  Budesonide ER 9 MG TB24 Take by mouth.    [provider]  diphenoxylate-atropine (LOMOTIL) 2.5-0.025 MG tablet Take daily as needed by mouth.    [provider]  ondansetron (ZOFRAN) 4 MG tablet Take 4 mg by mouth as needed. 06/04/10   [provider]  UNITHROID 137 MCG tablet TAKE 1 TABLET (137 MCG TOTAL) BY MOUTH DAILY BEFORE BREAKFAST. 07/17/20   Sunnie Nielsen, DO  amitriptyline (ELAVIL) 10 MG tablet Take 1-2 tablets (10-20 mg  total) by mouth at bedtime. 11/01/19 05/14/21  Sunnie Nielsen, DO  budesonide (PULMICORT) 1 MG/2ML nebulizer solution Take 2 mLs (1 mg total) by nebulization 2 (two) times daily. 11/01/19 05/14/21  Sunnie Nielsen, DO  naratriptan (AMERGE) 1 MG TABS tablet Take by mouth. 03/06/20 05/14/21  [provider]    Family History Family History  Problem Relation Age of Onset  . Heart attack Maternal Grandmother   . Rashes / Skin problems Paternal Grandmother   . Clotting disorder Paternal Uncle     Social History Social History   Tobacco Use  . Smoking status: Never Smoker  . Smokeless tobacco: Never Used  Substance Use Topics  . Alcohol use: No    Comment: socially when not pregnant  . Drug use: No     Allergies   Rifaximin, Gluten meal, Amoxicillin-pot clavulanate, Clavulanic acid, Promethazine, Tessalon [benzonatate], Xifaxan [rifaximin], Dextromethorphan, and Levothyroxine sodium   Review of Systems Review of Systems + neck stiffness, pain. No paresthesia, vomiting, URI, HA or fever.   Physical Exam Triage Vital Signs ED Triage Vitals [05/14/21 1133]  Enc Vitals Group     BP 108/76     Pulse Rate 94     Resp 17     Temp 98.9 F (37.2 C)     Temp Source Oral     SpO2 100 %     Weight      Height      Head Circumference      Peak Flow      Pain Score 9     Pain Loc      Pain Edu?      Excl. in GC?    No data found.  Updated Vital Signs BP 108/76 (BP Location: Right Arm)   Pulse 94   Temp 98.9 F (37.2 C) (Oral)   Resp 17   LMP 04/26/2021   SpO2 100%   Visual Acuity Right Eye Distance:   Left Eye Distance:   Bilateral Distance:    Right Eye Near:   Left Eye Near:    Bilateral Near:     Physical Exam Vitals reviewed.  Constitutional:      General: She is in acute distress.     Comments: Severe pain when she tries to move her head  HENT:     Head: Normocephalic and atraumatic.  Eyes:     General: No scleral icterus.     Conjunctiva/sclera: Conjunctivae normal.  Neck:     Comments: Has point tenderness on T1, R sternocleidomastoid, R trap and rhomboid. She has minimal rotation and cant anteriorly flex head down Pulmonary:     Effort: Pulmonary effort is normal.  Musculoskeletal:        General: Normal range of motion.  Lymphadenopathy:  Cervical: No cervical adenopathy.  Skin:    General: Skin is warm and dry.     Findings: No rash.  Neurological:     Mental Status: She is oriented to person, place, and time.     Motor: No weakness.     Gait: Gait normal.     Deep Tendon Reflexes: Reflexes normal.  Psychiatric:        Mood and Affect: Mood normal.        Behavior: Behavior normal.        Thought Content: Thought content normal.        Judgment: Judgment normal.      UC Treatments / Results  Labs (all labs ordered are listed, but only abnormal results are displayed) Labs Reviewed - No data to display  EKG   Radiology DG Cervical Spine 2-3 Views  Result Date: 05/14/2021 CLINICAL DATA:  Severe low posterior neck pain EXAM: CERVICAL SPINE - 2-3 VIEW COMPARISON:  None. FINDINGS: There is no evidence of cervical spine fracture or prevertebral soft tissue swelling. Alignment is normal. No other significant bone abnormalities are identified. IMPRESSION: Negative cervical spine radiographs. Electronically Signed   By: Marlan Palau M.D.   On: 05/14/2021 12:52    Procedures Procedures (including critical care time)  Medications Ordered in UC Medications - No data to display  Initial Impression / Assessment and Plan / UC Course  I have reviewed the triage vital signs and the nursing notes. Pertinent imaging results that were available during my care of the patient were reviewed by me and considered in my medical decision making (see chart for details). She declined Toradol for pain. Has Trap/ Rhomboid muscle spasm for unknown cause. She has prednisone from her Dermatologist she has not  started yet, so will start that. I placed her on Tramadol. Skelaxin for day time and Flexeril for night time. May seek Massage and or Chiropractic care to help her heal.   Final Clinical Impressions(s) / UC Diagnoses   Final diagnoses:  Trapezius muscle spasm  Torticollis     Discharge Instructions     You may seek massage and chiropractic care to help you get better     ED Prescriptions    Medication Sig Dispense Auth. Provider   cyclobenzaprine (FLEXERIL) 10 MG tablet 1/2 -1 qhs for muscle spasm 30 tablet Rodriguez-Southworth, Nettie Elm, PA-C   metaxalone (SKELAXIN) 800 MG tablet Take 1 tablet (800 mg total) by mouth in the morning and at bedtime. For day time muscle spasm 21 tablet Rodriguez-Southworth, Nettie Elm, PA-C   traMADol (ULTRAM) 50 MG tablet Take 1 tablet (50 mg total) by mouth every 6 (six) hours as needed. 15 tablet Rodriguez-Southworth, Nettie Elm, PA-C     I have reviewed the PDMP during this encounter.   Garey Ham, PA-C 05/14/21 1315

## 2021-05-14 NOTE — Discharge Instructions (Signed)
You may seek massage and chiropractic care to help you get better

## 2021-05-17 DIAGNOSIS — K3184 Gastroparesis: Secondary | ICD-10-CM | POA: Insufficient documentation

## 2021-05-17 DIAGNOSIS — R197 Diarrhea, unspecified: Secondary | ICD-10-CM | POA: Insufficient documentation

## 2021-10-14 ENCOUNTER — Ambulatory Visit: Payer: Managed Care, Other (non HMO) | Admitting: Family Medicine

## 2021-10-14 ENCOUNTER — Encounter: Payer: Self-pay | Admitting: Family Medicine

## 2021-10-14 VITALS — BP 114/82 | HR 92 | Temp 98.2°F | Wt 110.1 lb

## 2021-10-14 DIAGNOSIS — N760 Acute vaginitis: Secondary | ICD-10-CM

## 2021-10-14 DIAGNOSIS — N898 Other specified noninflammatory disorders of vagina: Secondary | ICD-10-CM | POA: Diagnosis not present

## 2021-10-14 DIAGNOSIS — B9689 Other specified bacterial agents as the cause of diseases classified elsewhere: Secondary | ICD-10-CM

## 2021-10-14 LAB — POCT URINALYSIS DIP (CLINITEK)
Bilirubin, UA: NEGATIVE
Blood, UA: NEGATIVE
Glucose, UA: NEGATIVE mg/dL
Ketones, POC UA: NEGATIVE mg/dL
Leukocytes, UA: NEGATIVE
Nitrite, UA: NEGATIVE
POC PROTEIN,UA: NEGATIVE
Spec Grav, UA: 1.025 (ref 1.010–1.025)
Urobilinogen, UA: 0.2 E.U./dL
pH, UA: 7 (ref 5.0–8.0)

## 2021-10-14 LAB — POCT URINE PREGNANCY: Preg Test, Ur: NEGATIVE

## 2021-10-14 NOTE — Patient Instructions (Signed)
Urinalysis today suggesting some mild dehydration. We will send off for culture to make sure there is no infection.  Pregnancy test negative Swab sent for yeast, BV and STD screening. We will let you know results.  In the meantime, stay well hydrated, good hygiene, no douching, no harsh soaps.   Please contact office for follow-up if symptoms do not improve or worsen. Seek emergency care if symptoms become severe.

## 2021-10-14 NOTE — Progress Notes (Signed)
Acute Office Visit  Subjective:    Patient ID: Emily [name], female    DOB: [birth year], 40 y.o.   MRN: [id number]  Chief Complaint  Patient presents with   Vaginitis    HPI Patient is in today for vaginal odor.  Patinet is reporting intermittent vaginal odor for 2 weeks. States initially she did have some itching and burning so she treated with OTC monistat which cleared all of her symptoms, but a few days later the odor returned. She did a second course of Monistat and that helped again but only for a few days. States the odor is back again "with a vengeance." She denies any itching, burning, fevers, color changes, discharge changes, abdominal/pelvic/flank/back pain. States she should be starting her period any day now so she has had some occasional spotting.     Past Medical History:  Diagnosis Date   Anemia    history   Bartholin cyst 12/27/2011   Celiac disease    Collagenous colitis    Delivery of twins, both live 12/02/2012   Dichorionic diamniotic twin pregnancy 12/01/2012   Headache(784.0)    complex migraines with cardiac and neuro symptoms during pregnancy   Hypothyroidism    Infertility, female    SVD (spontaneous vaginal delivery) 12/02/2012   Thyroid disease     Past Surgical History:  Procedure Laterality Date   BARTHOLIN CYST MARSUPIALIZATION  12/28/2011   Procedure: BARTHOLIN CYST MARSUPIALIZATION;  Surgeon: Thornell Sartorius, MD;  Location: [facility];  Service: Gynecology;  Laterality: N/A;   COLONOSCOPY     RHINOPLASTY     TONSILLECTOMY     UPPER GASTROINTESTINAL ENDOSCOPY     WISDOM TOOTH EXTRACTION      Family History  Problem Relation Age of Onset   Heart attack Maternal Grandmother    Rashes / Skin problems Paternal Grandmother    Clotting disorder Paternal Uncle     Social History   Socioeconomic History   Marital status: Married    Spouse name: Not on file   Number of children: Not on file   Years of education: Not on file   Highest  education level: Not on file  Occupational History   Not on file  Tobacco Use   Smoking status: Never   Smokeless tobacco: Never  Substance and Sexual Activity   Alcohol use: No    Comment: socially when not pregnant   Drug use: No   Sexual activity: Not Currently    Birth control/protection: None  Other Topics Concern   Not on file  Social History Narrative   Not on file   Social Determinants of Health   Financial Resource Strain: Not on file  Food Insecurity: Not on file  Transportation Needs: Not on file  Physical Activity: Not on file  Stress: Not on file  Social Connections: Not on file  Intimate Partner Violence: Not on file    Outpatient Medications Prior to Visit  Medication Sig Dispense Refill   ALPRAZolam (XANAX) 0.25 MG tablet Take 1 tablet (0.25 mg total) by mouth daily as needed for anxiety (use sparingly to avoid dependence). 30 tablet 0   Budesonide ER 9 MG TB24 Take by mouth.     cyclobenzaprine (FLEXERIL) 10 MG tablet 1/2 -1 qhs for muscle spasm 30 tablet 0   diphenoxylate-atropine (LOMOTIL) 2.5-0.025 MG tablet Take daily as needed by mouth.     metaxalone (SKELAXIN) 800 MG tablet Take 1 tablet (800 mg total) by mouth in the morning and at bedtime.  For day time muscle spasm 21 tablet 0   ondansetron (ZOFRAN) 4 MG tablet Take 4 mg by mouth as needed.     traMADol (ULTRAM) 50 MG tablet Take 1 tablet (50 mg total) by mouth every 6 (six) hours as needed. 15 tablet 0   UNITHROID 137 MCG tablet TAKE 1 TABLET (137 MCG TOTAL) BY MOUTH DAILY BEFORE BREAKFAST. 90 tablet 3   No facility-administered medications prior to visit.    Allergies  Allergen Reactions   Amoxicillin-Pot Clavulanate Other (See Comments), Hives and Itching    Itching throat - amoxicilin alone doesn't cause any problems  Other reaction(s): Unknown "itchy throat" Itching throat - amoxicilin alone doesn't cause any problems     Rifaximin Itching and Other (See Comments)    Reaction:  Itching  of throat   Benzonatate Other (See Comments)    Lump on throat Other reaction(s): Other Lump on throat Lump on throat   Clavulanic Acid Other (See Comments)    Flushing Other reaction(s): Other Flushing Flushing   Gluten Meal Diarrhea, Nausea And Vomiting and Other (See Comments)    Pt has celiac disease Other reaction(s): Other Celiac disease Pt has celiac disease Other Reaction: GI Upset Pt has celiac disease   Ceftriaxone    Epinephrine Other (See Comments)   Levothyroxine Other (See Comments)   Promethazine Swelling and Other (See Comments)   Tramadol    Xifaxan [Rifaximin]    Dextromethorphan Other (See Comments)   Levothyroxine Sodium Diarrhea    Only has GI reaction to synthroid brand    Review of Systems All review of systems negative except what is listed in the HPI     Objective:    Physical Exam Vitals reviewed.  Constitutional:      Appearance: Normal appearance.  HENT:     Head: Normocephalic and atraumatic.  Cardiovascular:     Rate and Rhythm: Normal rate and regular rhythm.  Pulmonary:     Effort: Pulmonary effort is normal.     Breath sounds: Normal breath sounds.  Abdominal:     General: Abdomen is flat. Bowel sounds are normal. There is no distension.     Palpations: Abdomen is soft. There is no mass.     Tenderness: There is no abdominal tenderness. There is no right CVA tenderness, left CVA tenderness, guarding or rebound.     Hernia: No hernia is present.  Genitourinary:    Comments: Deferred, self swab Neurological:     Mental Status: She is alert and oriented to person, place, and time.  Psychiatric:        Mood and Affect: Mood normal.        Behavior: Behavior normal.        Thought Content: Thought content normal.        Judgment: Judgment normal.    BP 114/82 (BP Location: Left Arm, Patient Position: Sitting, Cuff Size: Normal)   Pulse 92   Temp 98.2 F (36.8 C) (Oral)   Wt 110 lb 1.9 oz (50 kg)   BMI 17.77 kg/m  Wt  Readings from Last 3 Encounters:  10/14/21 110 lb 1.9 oz (50 kg)  08/12/20 110 lb (49.9 kg)  07/07/20 109 lb (49.4 kg)    Health Maintenance Due  Topic Date Due   Hepatitis C Screening  Never done    There are no preventive care reminders to display for this patient.   Lab Results  Component Value Date   TSH 0.87 07/07/2020   Lab  Results  Component Value Date   WBC 4.6 07/07/2020   HGB 13.5 07/07/2020   HCT 40.4 07/07/2020   MCV 89.4 07/07/2020   PLT 162 07/07/2020   Lab Results  Component Value Date   NA 137 07/07/2020   K 4.4 07/07/2020   CO2 24 07/07/2020   GLUCOSE 88 07/07/2020   BUN 12 07/07/2020   CREATININE 0.76 07/07/2020   BILITOT 0.6 07/07/2020   ALKPHOS 32 (L) 05/29/2017   AST 14 07/07/2020   ALT 10 07/07/2020   PROT 6.4 07/07/2020   ALBUMIN 4.5 05/29/2017   CALCIUM 8.9 07/07/2020   Lab Results  Component Value Date   CHOL 164 07/07/2020   Lab Results  Component Value Date   HDL 52 07/07/2020   Lab Results  Component Value Date   LDLCALC 98 07/07/2020   Lab Results  Component Value Date   TRIG 57 07/07/2020   Lab Results  Component Value Date   CHOLHDL 3.2 07/07/2020   No results found for: HGBA1C     Assessment & Plan:   1. Vaginal odor 2. Vaginal discharge Urinalysis today suggesting some mild dehydration. We will send off for culture to make sure there is no infection.  Pregnancy test negative Swab sent for yeast, BV and STD screening. We will let you know results.  In the meantime, stay well hydrated, good hygiene, no douching, no harsh soaps.   - SureSwab Advanced Vaginitis Plus,TMA - POCT URINALYSIS DIP (CLINITEK) - Urinalysis, Routine w reflex microscopic - Urine Culture - POCT urine pregnancy  Follow-up as needed.   Lollie Marrow Reola Calkins, DNP, FNP-C

## 2021-10-16 LAB — URINALYSIS, ROUTINE W REFLEX MICROSCOPIC
Bilirubin Urine: NEGATIVE
Glucose, UA: NEGATIVE
Hgb urine dipstick: NEGATIVE
Ketones, ur: NEGATIVE
Leukocytes,Ua: NEGATIVE
Nitrite: NEGATIVE
Protein, ur: NEGATIVE
Specific Gravity, Urine: 1.022 (ref 1.001–1.035)
pH: 7 (ref 5.0–8.0)

## 2021-10-16 LAB — URINE CULTURE
MICRO NUMBER:: 12592607
Result:: NO GROWTH
SPECIMEN QUALITY:: ADEQUATE

## 2021-10-18 LAB — SURESWAB® ADVANCED VAGINITIS PLUS,TMA
C. trachomatis RNA, TMA: NOT DETECTED
CANDIDA SPECIES: NOT DETECTED
Candida glabrata: NOT DETECTED
N. gonorrhoeae RNA, TMA: NOT DETECTED
SURESWAB(R) ADV BACTERIAL VAGINOSIS(BV),TMA: POSITIVE — AB
TRICHOMONAS VAGINALIS (TV),TMA: NOT DETECTED

## 2021-10-19 MED ORDER — METRONIDAZOLE 0.75 % VA GEL: 1.0000 | Freq: Every day | VAGINAL | 0 refills | Status: AC

## 2021-10-19 NOTE — Addendum Note (Signed)
Addended by: Hyman Hopes B on: 10/19/2021 07:57 AM   Modules accepted: Orders

## 2021-11-26 ENCOUNTER — Encounter: Payer: Self-pay | Admitting: Family Medicine

## 2021-11-26 ENCOUNTER — Telehealth: Payer: Self-pay | Admitting: Neurology

## 2021-11-26 ENCOUNTER — Other Ambulatory Visit: Payer: Self-pay

## 2021-11-26 ENCOUNTER — Ambulatory Visit: Payer: Managed Care, Other (non HMO) | Admitting: Family Medicine

## 2021-11-26 VITALS — BP 104/77 | HR 91 | Ht 66.0 in | Wt 110.0 lb

## 2021-11-26 DIAGNOSIS — N898 Other specified noninflammatory disorders of vagina: Secondary | ICD-10-CM

## 2021-11-26 DIAGNOSIS — F32A Depression, unspecified: Secondary | ICD-10-CM | POA: Diagnosis not present

## 2021-11-26 DIAGNOSIS — F419 Anxiety disorder, unspecified: Secondary | ICD-10-CM

## 2021-11-26 NOTE — Progress Notes (Signed)
Acute Office Visit  Subjective:    Patient ID: Emily [name], female    DOB: [birth year], 40 y.o.   MRN: [id number]  Chief Complaint  Patient presents with   Anxiety   Depression     Patient is in today for anxiety and depression.   Patient reports that she has struggled with severe depression/anxiety for the past 15-20 years. States she has seen many therapists and tried at least 10 different medications, but never found anything that really seemed to work. Her most recent medication was amitriptyline earlier this year. States she does have some PRN xanax but tries to reserve this and typically only uses once a month. She heard about genetic testing to see what time of medication she might do well with. She would like to learn more about this. She is not interested in trying counseling again.       [facility] Office Visit from 11/26/2021 in [facility]  PHQ-9 Total Score 14      GAD 7 : Generalized Anxiety Score 11/26/2021 11/01/2019 03/06/2019 12/19/2018  Nervous, Anxious, on Edge 2 3 2 1   Control/stop worrying 3 3 2 1   Worry too much - different things 3 3 2 1   Trouble relaxing 2 3 1 1   Restless 0 1 0 1  Easily annoyed or irritable 2 2 0 1  Afraid - awful might happen 3 3 2 1   Total GAD 7 Score 15 18 9 7   Anxiety Difficulty Very difficult Extremely difficult Not difficult at all Somewhat difficult    Additionally, patient reports some recurrent BV symptoms - vaginal irritation and discharge. She has been getting some relief with boric acid, but would like to be swabbed again today to see if maybe it is yeast this time instead.     Past Medical History:  Diagnosis Date   Anemia    history   Bartholin cyst 12/27/2011   Celiac disease    Collagenous colitis    Delivery of twins, both live 12/02/2012   Dichorionic diamniotic twin pregnancy 12/01/2012   Headache(784.0)    complex migraines with cardiac and neuro symptoms during  pregnancy   Hypothyroidism    Infertility, female    SVD (spontaneous vaginal delivery) 12/02/2012   Thyroid disease     Past Surgical History:  Procedure Laterality Date   BARTHOLIN CYST MARSUPIALIZATION  12/28/2011   Procedure: BARTHOLIN CYST MARSUPIALIZATION;  Surgeon: Thornell Sartorius, MD;  Location: [facility];  Service: Gynecology;  Laterality: N/A;   COLONOSCOPY     RHINOPLASTY     TONSILLECTOMY     UPPER GASTROINTESTINAL ENDOSCOPY     WISDOM TOOTH EXTRACTION      Family History  Problem Relation Age of Onset   Heart attack Maternal Grandmother    Rashes / Skin problems Paternal Grandmother    Clotting disorder Paternal Uncle     Social History   Socioeconomic History   Marital status: Married    Spouse name: Not on file   Number of children: Not on file   Years of education: Not on file   Highest education level: Not on file  Occupational History   Not on file  Tobacco Use   Smoking status: Never   Smokeless tobacco: Never  Substance and Sexual Activity   Alcohol use: No    Comment: socially when not pregnant   Drug use: No   Sexual activity: Not Currently    Birth control/protection: None  Other  Topics Concern   Not on file  Social History Narrative   Not on file   Social Determinants of Health   Financial Resource Strain: Not on file  Food Insecurity: Not on file  Transportation Needs: Not on file  Physical Activity: Not on file  Stress: Not on file  Social Connections: Not on file  Intimate Partner Violence: Not on file    Outpatient Medications Prior to Visit  Medication Sig Dispense Refill   ALPRAZolam (XANAX) 0.25 MG tablet Take 1 tablet (0.25 mg total) by mouth daily as needed for anxiety (use sparingly to avoid dependence). 30 tablet 0   Budesonide ER 9 MG TB24 Take by mouth.     cyclobenzaprine (FLEXERIL) 10 MG tablet 1/2 -1 qhs for muscle spasm 30 tablet 0   diphenoxylate-atropine (LOMOTIL) 2.5-0.025 MG tablet Take daily as needed by mouth.      metaxalone (SKELAXIN) 800 MG tablet Take 1 tablet (800 mg total) by mouth in the morning and at bedtime. For day time muscle spasm 21 tablet 0   ondansetron (ZOFRAN) 4 MG tablet Take 4 mg by mouth as needed.     traMADol (ULTRAM) 50 MG tablet Take 1 tablet (50 mg total) by mouth every 6 (six) hours as needed. 15 tablet 0   UNITHROID 137 MCG tablet TAKE 1 TABLET (137 MCG TOTAL) BY MOUTH DAILY BEFORE BREAKFAST. 90 tablet 3   No facility-administered medications prior to visit.    Allergies  Allergen Reactions   Amoxicillin-Pot Clavulanate Other (See Comments), Hives and Itching    Itching throat - amoxicilin alone doesn't cause any problems  Other reaction(s): Unknown "itchy throat" Itching throat - amoxicilin alone doesn't cause any problems   Other reaction(s): anaphylaxis   Promethazine Swelling and Other (See Comments)    Other reaction(s): face swelling Other reaction(s): Unknown   Rifaximin Itching and Other (See Comments)    Reaction:  Itching of throat   Rifaximin Anaphylaxis, Itching and Rash    Other reaction(s): anaphylaxis, Other, Unknown throat Reaction:  Itching of throat Reaction:  Itching of throat    Benzonatate Other (See Comments)    Lump on throat Other reaction(s): Other Lump on throat Lump on throat   Clavulanic Acid Other (See Comments)    Flushing Other reaction(s): Other Flushing Flushing   Gluten Meal Diarrhea, Nausea And Vomiting and Other (See Comments)    Pt has celiac disease Other reaction(s): Other Celiac disease Pt has celiac disease Other Reaction: GI Upset Pt has celiac disease   Levothyroxine Other (See Comments) and Diarrhea    Other reaction(s): GI problems Other reaction(s): GI intolerance, Unknown Only has GI reaction to synthroid brand Only has GI reaction to synthroid brand   Ceftriaxone    Epinephrine Other (See Comments)    Other reaction(s): anaphylaxis   Tramadol    Dextromethorphan Other (See Comments)    Levothyroxine Sodium Diarrhea    Only has GI reaction to synthroid brand    Review of systems: All review of systems negative except what is listed in the HPI      Objective:    Physical Exam Vitals reviewed.  Constitutional:      Appearance: Normal appearance. She is normal weight.  HENT:     Head: Normocephalic and atraumatic.  Skin:    General: Skin is warm and dry.  Neurological:     General: No focal deficit present.     Mental Status: She is alert and oriented to person, place, and time.  Mental status is at baseline.  Psychiatric:        Mood and Affect: Mood normal.        Behavior: Behavior normal.        Thought Content: Thought content normal.        Judgment: Judgment normal.    BP 104/77 (BP Location: Left Arm, Patient Position: Sitting, Cuff Size: Small)    Pulse 91    Ht 5\' 6"  (1.676 m)    Wt 110 lb (49.9 kg)    SpO2 100%    BMI 17.75 kg/m  Wt Readings from Last 3 Encounters:  11/26/21 110 lb (49.9 kg)  10/14/21 110 lb 1.9 oz (50 kg)  08/12/20 110 lb (49.9 kg)    Health Maintenance Due  Topic Date Due   Hepatitis C Screening  Never done    There are no preventive care reminders to display for this patient.   Lab Results  Component Value Date   TSH 0.87 07/07/2020   Lab Results  Component Value Date   WBC 4.6 07/07/2020   HGB 13.5 07/07/2020   HCT 40.4 07/07/2020   MCV 89.4 07/07/2020   PLT 162 07/07/2020   Lab Results  Component Value Date   NA 137 07/07/2020   K 4.4 07/07/2020   CO2 24 07/07/2020   GLUCOSE 88 07/07/2020   BUN 12 07/07/2020   CREATININE 0.76 07/07/2020   BILITOT 0.6 07/07/2020   ALKPHOS 32 (L) 05/29/2017   AST 14 07/07/2020   ALT 10 07/07/2020   PROT 6.4 07/07/2020   ALBUMIN 4.5 05/29/2017   CALCIUM 8.9 07/07/2020   Lab Results  Component Value Date   CHOL 164 07/07/2020   Lab Results  Component Value Date   HDL 52 07/07/2020   Lab Results  Component Value Date   LDLCALC 98 07/07/2020   Lab Results   Component Value Date   TRIG 57 07/07/2020   Lab Results  Component Value Date   CHOLHDL 3.2 07/07/2020   No results found for: HGBA1C     Assessment & Plan:    1. Vaginal discharge Recurrent vaginal discharge. Declines STD testing - negative last time. Will retest for yeast and BV today and update her with results. Can continue boric acid suppositories if that seems to be helping. Consider GYN follow-up if cycle continues.  - SureSwab Advanced Vaginitis Plus,TMA  2. Anxiety and depression Patient was wanting to know cost of genetic testing before committing - Order placed into [facility] by [place], CMA. Aware that they will call her with additional information and how to proceed with testing. No other changes today. Patient declined counseling referral.    Follow-up pending results or as needed.   [place] Lollie Marrow, DNP, FNP-C

## 2021-11-26 NOTE — Patient Instructions (Signed)
-  We are submitting the order to [facility]. Someone will reach out to you (probably a [phone] number) to let you know costs and then if you want to proceed they will mail you a kit.  -Sending off swab to check for yeast and BV. We will let you know results.

## 2021-11-26 NOTE — Telephone Encounter (Signed)
Patient interested in [facility] testing per Emily [name]. Order placed, they will contact patient about financial obligations before sending kit. Order placed under Dr. Madilyn Fireman since she is her supervising physician.   Thank you! Emily [name]'s order has been placed.  We will ship a kit to them within 48 hours.  For more information about [facility], visit [email or web address]. ORDER [facility] Psychotropic ICD-10 code(s) for this patients psychiatric diagnosis Diagnosis Healthcare Provider Information How does pharmacogenomic testing fit into your treatment plan for this patient? I'm considering augmenting therapy with a new medication or starting/switching to a new medication Treatment Plan The undersigned attests that he/she is licensed to order the selected test(s). I acknowledge that the patient has been provided with information regarding the selected genetic test(s) and obtained  consent for genetic testing from the patient or his/her legal authorized representative. I attest that the selected genetic test(s) are medically necessary and that these results will be used in the medical  management and treatment decisions for the above referenced patient and agree to provide any additional information or documentation to support medical necessity, upon request. Insurers require that you maintain documentation supporting the medical necessity for [facility] tests in the patients medical record. Please verify that the order information above is correct and include  in your patients medical record.  Psychiatric medications that have failed to work for this patient (previously or currently prescribed) Failed Medications I'm considering a dosage adjustment to currently prescribed medication(s) Emily [name] [birth year] [id number] Xanax ( alprazolam ), Zoloft ( sertraline ), Trintellix ( vortioxetine ), Elavil ( amitriptyline )  Emily [name] F41.9 Anxiety  disorder, unspecified F32.A Depression, unspecified Patient Name Patient Date of Birth Order Number  [facility] [id number]  Fax [place] INFORMATION  2021 [place] doing business as [facility]. [facility] and associated logos are registered trademarks of [facility]. Ordered by: [facility] MTHFR ICD-10 code(s) for this patients psychiatric diagnosis Diagnosis Emily [name] 1981/02/19 [id number] F41.9 Anxiety disorder, unspecified F32.A Depression, unspecified Emily [name]

## 2021-11-30 LAB — SURESWAB® ADVANCED VAGINITIS PLUS,TMA
C. trachomatis RNA, TMA: NOT DETECTED
CANDIDA SPECIES: NOT DETECTED
Candida glabrata: NOT DETECTED
N. gonorrhoeae RNA, TMA: NOT DETECTED
SURESWAB(R) ADV BACTERIAL VAGINOSIS(BV),TMA: NEGATIVE
TRICHOMONAS VAGINALIS (TV),TMA: NOT DETECTED

## 2021-12-12 ENCOUNTER — Encounter: Payer: Self-pay | Admitting: Family Medicine

## 2021-12-14 NOTE — Telephone Encounter (Signed)
I not entirely sure how to get results for genetic testing. To my understanding, these results takes several weeks to be completed and results are generally mailed to the clinic and office. Tonya, any insights to this request?

## 2021-12-14 NOTE — Telephone Encounter (Signed)
Task completed. Patient will be transitioning care to Dr. Ashley Royalty in mid-January. Per patient, she is requesting that a copy of the results be sent to her via e-mail. She declined to schedule an appointment at this time to discuss the results due to financial strain. Patient will discuss the results and treatment options during her appointment with Dr. Ashley Royalty.

## 2021-12-17 NOTE — Telephone Encounter (Signed)
Task completed. Patient does not want come into the office to pick up her copy. Patient requested if the results can be e-mailed to her. Inform patient that a front desk staff member will attempt to send her the results. Results of the testing handed to Lennox Pippins to send and scan into patient's chart. No other inquiries during the call.

## 2022-01-11 ENCOUNTER — Ambulatory Visit: Payer: Managed Care, Other (non HMO) | Admitting: Family Medicine

## 2022-01-11 ENCOUNTER — Other Ambulatory Visit: Payer: Self-pay

## 2022-01-11 ENCOUNTER — Encounter: Payer: Self-pay | Admitting: Family Medicine

## 2022-01-11 VITALS — BP 109/77 | HR 78 | Ht 66.0 in | Wt 112.0 lb

## 2022-01-11 DIAGNOSIS — K52839 Microscopic colitis, unspecified: Secondary | ICD-10-CM

## 2022-01-11 DIAGNOSIS — F419 Anxiety disorder, unspecified: Secondary | ICD-10-CM | POA: Diagnosis not present

## 2022-01-11 DIAGNOSIS — F32A Depression, unspecified: Secondary | ICD-10-CM

## 2022-01-11 DIAGNOSIS — K900 Celiac disease: Secondary | ICD-10-CM

## 2022-01-11 DIAGNOSIS — Z8719 Personal history of other diseases of the digestive system: Secondary | ICD-10-CM | POA: Diagnosis not present

## 2022-01-11 DIAGNOSIS — E039 Hypothyroidism, unspecified: Secondary | ICD-10-CM

## 2022-01-11 MED ORDER — DESVENLAFAXINE SUCCINATE ER 25 MG PO TB24: 25.0000 mg | ORAL_TABLET | Freq: Every day | ORAL | 1 refills | Status: DC

## 2022-01-11 NOTE — Assessment & Plan Note (Signed)
Managed to gastroenterology.  Continues on extended release budesonide.

## 2022-01-11 NOTE — Assessment & Plan Note (Signed)
Due for updated thyroid function test.  Labs ordered.

## 2022-01-11 NOTE — Progress Notes (Signed)
Emily [name] - 41 y.o. female MRN [id number]  Date of birth: 16-Feb-1981  Subjective Chief Complaint  Patient presents with   Transitions Of Care    HPI Emily [name] is a 41 year old female here today for follow-up visit.  She is transitioning care from Dr. Sheppard Coil.  She has history of anxiety, complex migraines, celiac disease, microscopic colitis and hypothyroidism.  Celiac disease and microscopic colitis are managed with Dr. Paulita [name] at [facility].  She does follow a gluten-free diet.  She is taking budesonide ER for management of her microscopic colitis as well.  She would like to have updated celiac antibodies tested.  She has not had her thyroid levels checked in quite some time.  She does feel pretty good with current dosing of Unithroid.  Anxiety has been difficult to manage.  She has been on several medications in the past.  Recently completed GeneSight testing.  She is interested in trying additional medication which may be more compatible with her genetic make-up.  ROS:  A comprehensive ROS was completed and negative except as noted per HPI  Allergies  Allergen Reactions   Amoxicillin-Pot Clavulanate Anaphylaxis, Hives and Itching   Promethazine Swelling   Rifaximin Anaphylaxis, Itching and Rash        Benzonatate Other (See Comments)    Lump on throat   Clavulanic Acid Other (See Comments)    Flushing   Gluten Meal Diarrhea, Nausea And Vomiting and Other (See Comments)    GI Upset   Levothyroxine Diarrhea and Other (See Comments)    Only has GI reaction to synthroid brand   Rifaximin Itching    Itching of throat   Ceftriaxone    Epinephrine Other (See Comments)    Other reaction(s): anaphylaxis   Tramadol    Dextromethorphan Other (See Comments)   Levothyroxine Sodium Diarrhea    Only has GI reaction to synthroid brand    Past Medical History:  Diagnosis Date   Anemia    history   Bartholin cyst 12/27/2011   Celiac disease    Collagenous colitis    Delivery of  twins, both live 12/02/2012   Dichorionic diamniotic twin pregnancy 12/01/2012   Headache(784.0)    complex migraines with cardiac and neuro symptoms during pregnancy   Hypothyroidism    Infertility, female    SVD (spontaneous vaginal delivery) 12/02/2012   Thyroid disease     Past Surgical History:  Procedure Laterality Date   BARTHOLIN CYST MARSUPIALIZATION  12/28/2011   Procedure: BARTHOLIN CYST MARSUPIALIZATION;  Surgeon: Emily Sartorius, MD;  Location: [facility];  Service: Gynecology;  Laterality: N/A;   COLONOSCOPY     RHINOPLASTY     TONSILLECTOMY     UPPER GASTROINTESTINAL ENDOSCOPY     WISDOM TOOTH EXTRACTION      Social History   Socioeconomic History   Marital status: Married    Spouse name: Not on file   Number of children: Not on file   Years of education: Not on file   Highest education level: Not on file  Occupational History   Not on file  Tobacco Use   Smoking status: Never   Smokeless tobacco: Never  Substance and Sexual Activity   Alcohol use: No    Comment: socially when not pregnant   Drug use: No   Sexual activity: Not Currently    Birth control/protection: None  Other Topics Concern   Not on file  Social History Narrative   Not on file   Social Determinants of Health  Financial Resource Strain: Not on file  Food Insecurity: Not on file  Transportation Needs: Not on file  Physical Activity: Not on file  Stress: Not on file  Social Connections: Not on file    Family History  Problem Relation Age of Onset   Heart attack Maternal Grandmother    Rashes / Skin problems Paternal Grandmother    Clotting disorder Paternal Uncle     Health Maintenance  Topic Date Due   Hepatitis C Screening  Never done   COVID-19 Vaccine (3 - Pfizer risk series) 04/21/2020   INFLUENZA VACCINE  03/11/2022 (Originally 07/12/2021)   PAP SMEAR-Modifier  07/07/2025   TETANUS/TDAP  10/27/2025   HIV Screening  Completed   HPV VACCINES  Aged Out      ----------------------------------------------------------------------------------------------------------------------------------------------------------------------------------------------------------------- Physical Exam BP 109/77 (BP Location: Left Arm, Patient Position: Sitting, Cuff Size: Small)    Pulse 78    Ht 5\' 6"  (1.676 m)    Wt 112 lb (50.8 kg)    SpO2 96%    BMI 18.08 kg/m   Physical Exam Constitutional:      Appearance: Normal appearance.  Eyes:     General: No scleral icterus. Cardiovascular:     Rate and Rhythm: Normal rate and regular rhythm.  Musculoskeletal:     Cervical back: Neck supple.  Neurological:     General: No focal deficit present.     Mental Status: She is alert.  Psychiatric:        Mood and Affect: Mood normal.        Behavior: Behavior normal.    ------------------------------------------------------------------------------------------------------------------------------------------------------------------------------------------------------------------- Assessment and Plan  Anxiety and depression We discussed options for management of her anxiety and depression based on GeneSight testing.  Continue to compatibility with Pristiq and Viibryd.  Would like to try Pristiq initially we will start at 25 mg daily.  We can titrate this as needed.  History of celiac disease Following gastroenterology.  Follows gluten-free diet.  Updating celiac antibodies although these may be negative if she is adhering to a strictly gluten-free diet.  Microscopic colitis Managed to gastroenterology.  Continues on extended release budesonide.  Hypothyroid Due for updated thyroid function test.  Labs ordered.   Meds ordered this encounter  Medications   desvenlafaxine (PRISTIQ) 25 MG 24 hr tablet    Sig: Take 1 tablet (25 mg total) by mouth daily.    Dispense:  30 tablet    Refill:  1    Return in about 1 month (around 02/08/2022) for F/u new  medication.    This visit occurred during the SARS-CoV-2 public health emergency.  Safety protocols were in place, including screening questions prior to the visit, additional usage of staff PPE, and extensive cleaning of exam room while observing appropriate contact time as indicated for disinfecting solutions.

## 2022-01-11 NOTE — Assessment & Plan Note (Addendum)
Following gastroenterology.  Follows gluten-free diet.  Updating celiac antibodies although these may be negative if she is adhering to a strictly gluten-free diet.

## 2022-01-11 NOTE — Assessment & Plan Note (Signed)
We discussed options for management of her anxiety and depression based on GeneSight testing.  Continue to compatibility with Pristiq and Viibryd.  Would like to try Pristiq initially we will start at 25 mg daily.  We can titrate this as needed.

## 2022-01-12 ENCOUNTER — Encounter: Payer: Self-pay | Admitting: Family Medicine

## 2022-01-12 DIAGNOSIS — E039 Hypothyroidism, unspecified: Secondary | ICD-10-CM

## 2022-01-12 LAB — T3, FREE: T3, Free: 2.4 pg/mL (ref 2.3–4.2)

## 2022-01-12 LAB — CELIAC DISEASE PANEL
(tTG) Ab, IgA: 24.3 U/mL — ABNORMAL HIGH
(tTG) Ab, IgG: 18.9 U/mL — ABNORMAL HIGH
Gliadin IgA: 1.8 U/mL
Gliadin IgG: 7.2 U/mL
Immunoglobulin A: 92 mg/dL (ref 47–310)

## 2022-01-12 LAB — TSH: TSH: 22.76 mIU/L — ABNORMAL HIGH

## 2022-01-12 LAB — THYROID PEROXIDASE ANTIBODIES (TPO) (REFL): Thyroperoxidase Ab SerPl-aCnc: >900 IU/mL — ABNORMAL HIGH (ref <9)

## 2022-01-12 LAB — T4, FREE: Free T4: 0.9 ng/dL (ref 0.8–1.8)

## 2022-02-04 ENCOUNTER — Other Ambulatory Visit: Payer: Self-pay | Admitting: Family Medicine

## 2022-02-09 ENCOUNTER — Encounter: Payer: Self-pay | Admitting: Family Medicine

## 2022-02-09 ENCOUNTER — Other Ambulatory Visit: Payer: Self-pay

## 2022-02-09 ENCOUNTER — Ambulatory Visit: Payer: Managed Care, Other (non HMO) | Admitting: Family Medicine

## 2022-02-09 VITALS — BP 100/74 | HR 92 | Temp 98.3°F | Ht 66.0 in | Wt 112.0 lb

## 2022-02-09 DIAGNOSIS — K900 Celiac disease: Secondary | ICD-10-CM

## 2022-02-09 DIAGNOSIS — F32A Depression, unspecified: Secondary | ICD-10-CM

## 2022-02-09 DIAGNOSIS — K52839 Microscopic colitis, unspecified: Secondary | ICD-10-CM

## 2022-02-09 DIAGNOSIS — F419 Anxiety disorder, unspecified: Secondary | ICD-10-CM

## 2022-02-09 DIAGNOSIS — E039 Hypothyroidism, unspecified: Secondary | ICD-10-CM

## 2022-02-09 MED ORDER — MECLIZINE HCL 25 MG PO TABS: 25.0000 mg | ORAL_TABLET | Freq: Three times a day (TID) | ORAL | 0 refills | Status: AC | PRN

## 2022-02-11 MED ORDER — ESCITALOPRAM OXALATE 5 MG PO TABS: ORAL_TABLET | ORAL | 0 refills | Status: DC

## 2022-02-12 LAB — IRON,TIBC AND FERRITIN PANEL
%SAT: 8 % (calc) — ABNORMAL LOW (ref 16–45)
Ferritin: 8 ng/mL — ABNORMAL LOW (ref 16–154)
Iron: 28 ug/dL — ABNORMAL LOW (ref 40–190)
TIBC: 341 mcg/dL (calc) (ref 250–450)

## 2022-02-12 LAB — COMPLETE METABOLIC PANEL WITH GFR
AG Ratio: 2.7 (calc) — ABNORMAL HIGH (ref 1.0–2.5)
ALT: 9 U/L (ref 6–29)
AST: 14 U/L (ref 10–30)
Albumin: 4.6 g/dL (ref 3.6–5.1)
Alkaline phosphatase (APISO): 29 U/L — ABNORMAL LOW (ref 31–125)
BUN: 11 mg/dL (ref 7–25)
CO2: 28 mmol/L (ref 20–32)
Calcium: 9.3 mg/dL (ref 8.6–10.2)
Chloride: 106 mmol/L (ref 98–110)
Creat: 0.8 mg/dL (ref 0.50–0.97)
Globulin: 1.7 g/dL (calc) — ABNORMAL LOW (ref 1.9–3.7)
Glucose, Bld: 94 mg/dL (ref 65–99)
Potassium: 4.2 mmol/L (ref 3.5–5.3)
Sodium: 140 mmol/L (ref 135–146)
Total Bilirubin: 0.3 mg/dL (ref 0.2–1.2)
Total Protein: 6.3 g/dL (ref 6.1–8.1)
eGFR: 97 mL/min/{1.73_m2} (ref >=60)

## 2022-02-12 LAB — CBC WITH DIFFERENTIAL/PLATELET
Absolute Monocytes: 585 cells/uL (ref 200–950)
Basophils Absolute: 70 cells/uL (ref 0–200)
Basophils Relative: 0.9 %
Eosinophils Absolute: 367 cells/uL (ref 15–500)
Eosinophils Relative: 4.7 %
HCT: 39.3 % (ref 35.0–45.0)
Hemoglobin: 13.2 g/dL (ref 11.7–15.5)
Lymphs Abs: 1513 cells/uL (ref 850–3900)
MCH: 30.2 pg (ref 27.0–33.0)
MCHC: 33.6 g/dL (ref 32.0–36.0)
MCV: 89.9 fL (ref 80.0–100.0)
MPV: 12.1 fL (ref 7.5–12.5)
Monocytes Relative: 7.5 %
Neutro Abs: 5265 cells/uL (ref 1500–7800)
Neutrophils Relative %: 67.5 %
Platelets: 200 10*3/uL (ref 140–400)
RBC: 4.37 10*6/uL (ref 3.80–5.10)
RDW: 11.6 % (ref 11.0–15.0)
Total Lymphocyte: 19.4 %
WBC: 7.8 10*3/uL (ref 3.8–10.8)

## 2022-02-12 LAB — VITAMIN D 25 HYDROXY (VIT D DEFICIENCY, FRACTURES): Vit D, 25-Hydroxy: 28 ng/mL — ABNORMAL LOW (ref 30–100)

## 2022-02-12 LAB — FOLATE: Folate: 8.4 ng/mL

## 2022-02-12 LAB — ZINC: Zinc: 69 ug/dL (ref 60–130)

## 2022-02-12 LAB — VITAMIN B12: Vitamin B-12: 382 pg/mL (ref 200–1100)

## 2022-02-13 NOTE — Assessment & Plan Note (Signed)
Management per gastroenterology.  Doing fairly well with budesonide. ?

## 2022-02-13 NOTE — Assessment & Plan Note (Signed)
Taking levothyroxine as directed at this point.  We will recheck TSH levels in a few weeks. ?

## 2022-02-13 NOTE — Assessment & Plan Note (Signed)
We did discuss additional options which may be helpful for anxiety including Lexapro or Fetzima.  She will take these under consideration. ?

## 2022-02-13 NOTE — Progress Notes (Signed)
?Emily [name] - 41 y.o. female MRN [id number]  Date of birth: 1981/01/26 ? ?Subjective ?Chief Complaint  ?Patient presents with  ? Follow-up  ? ? ?HPI ?Emily [name] is a 41 year old female here today for follow-up visit. ? ?Started on Viibryd at last visit.  Unfortunately was unable to tolerate this.  She has had several other medications in the past which have either not worked well for her or she has been intolerant to.  She would be willing to consider trying additional options. ? ?She is due for updated labs to check for nutritional deficiencies related to her celiac disease.  She would like copies of these sent to her GI provider. ? ?She is taking levothyroxine regularly.  She does feel that sometimes she is more tired feeling after taking levothyroxine.   ? ?ROS:  A comprehensive ROS was completed and negative except as noted per HPI ? ?Allergies  ?Allergen Reactions  ? Amoxicillin-Pot Clavulanate Anaphylaxis, Hives and Itching  ? Promethazine Swelling  ? Rifaximin Anaphylaxis, Itching and Rash  ?   ?  ? Benzonatate Other (See Comments)  ?  Lump on throat  ? Clavulanic Acid Other (See Comments)  ?  Flushing  ? Gluten Meal Diarrhea, Nausea And Vomiting and Other (See Comments)  ?  GI Upset  ? Levothyroxine Diarrhea and Other (See Comments)  ?  Only has GI reaction to synthroid brand  ? Rifaximin Itching  ?  Itching of throat  ? Ceftriaxone   ? Epinephrine Other (See Comments)  ?  Other reaction(s): anaphylaxis  ? Tramadol   ? Dextromethorphan Other (See Comments)  ? Levothyroxine Sodium Diarrhea  ?  Only has GI reaction to synthroid brand  ? ? ?Past Medical History:  ?Diagnosis Date  ? Anemia   ? history  ? Bartholin cyst 12/27/2011  ? Celiac disease   ? Collagenous colitis   ? Delivery of twins, both live 12/02/2012  ? Dichorionic diamniotic twin pregnancy 12/01/2012  ? Headache(784.0)   ? complex migraines with cardiac and neuro symptoms during pregnancy  ? Hypothyroidism   ? Infertility, female   ? SVD  (spontaneous vaginal delivery) 12/02/2012  ? Thyroid disease   ? ? ?Past Surgical History:  ?Procedure Laterality Date  ? BARTHOLIN CYST MARSUPIALIZATION  12/28/2011  ? Procedure: BARTHOLIN CYST MARSUPIALIZATION;  Surgeon: Thornell Sartorius, MD;  Location: [facility];  Service: Gynecology;  Laterality: N/A;  ? COLONOSCOPY    ? RHINOPLASTY    ? TONSILLECTOMY    ? UPPER GASTROINTESTINAL ENDOSCOPY    ? WISDOM TOOTH EXTRACTION    ? ? ?Social History  ? ?Socioeconomic History  ? Marital status: Married  ?  Spouse name: Not on file  ? Number of children: Not on file  ? Years of education: Not on file  ? Highest education level: Not on file  ?Occupational History  ? Not on file  ?Tobacco Use  ? Smoking status: Never  ? Smokeless tobacco: Never  ?Substance and Sexual Activity  ? Alcohol use: No  ?  Comment: socially when not pregnant  ? Drug use: No  ? Sexual activity: Not Currently  ?  Birth control/protection: None  ?Other Topics Concern  ? Not on file  ?Social History Narrative  ? Not on file  ? ?Social Determinants of Health  ? ?Financial Resource Strain: Not on file  ?Food Insecurity: Not on file  ?Transportation Needs: Not on file  ?Physical Activity: Not on file  ?Stress: Not on file  ?Social Connections:  Not on file  ? ? ?Family History  ?Problem Relation Age of Onset  ? Heart attack Maternal Grandmother   ? Rashes / Skin problems Paternal Grandmother   ? Clotting disorder Paternal Uncle   ? ? ?Health Maintenance  ?Topic Date Due  ? Hepatitis C Screening  Never done  ? COVID-19 Vaccine (3 - Pfizer risk series) 04/21/2020  ? INFLUENZA VACCINE  03/11/2022 (Originally 07/12/2021)  ? PAP SMEAR-Modifier  07/07/2025  ? TETANUS/TDAP  10/27/2025  ? HIV Screening  Completed  ? HPV VACCINES  Aged Out  ? ? ? ?----------------------------------------------------------------------------------------------------------------------------------------------------------------------------------------------------------------- ?Physical Exam ?BP  100/74   Pulse 92   Temp 98.3 ?F (36.8 ?C)   Ht 5\' 6"  (1.676 m)   Wt 112 lb (50.8 kg)   SpO2 98%   BMI 18.08 kg/m?  ? ?Physical Exam ?Constitutional:   ?   Appearance: Normal appearance.  ?Musculoskeletal:  ?   Cervical back: Neck supple.  ?Neurological:  ?   Mental Status: She is alert.  ?Psychiatric:     ?   Mood and Affect: Mood normal.     ?   Behavior: Behavior normal.  ? ? ?------------------------------------------------------------------------------------------------------------------------------------------------------------------------------------------------------------------- ?Assessment and Plan ? ?Hypothyroid ?Taking levothyroxine as directed at this point.  We will recheck TSH levels in a few weeks. ? ?Microscopic colitis ?Management per gastroenterology.  Doing fairly well with budesonide. ? ?Celiac disease/sprue ?She is very diligent about avoiding products containing gluten.  Checking additional lab work for nutritional deficiencies. ? ?Anxiety and depression ?We did discuss additional options which may be helpful for anxiety including Lexapro or Fetzima.  She will take these under consideration. ? ? ?Meds ordered this encounter  ?Medications  ? meclizine (ANTIVERT) 25 MG tablet  ?  Sig: Take 1 tablet (25 mg total) by mouth 3 (three) times daily as needed for dizziness.  ?  Dispense:  30 tablet  ?  Refill:  0  ? ? ?No follow-ups on file. ? ? ? ?This visit occurred during the SARS-CoV-2 public health emergency.  Safety protocols were in place, including screening questions prior to the visit, additional usage of staff PPE, and extensive cleaning of exam room while observing appropriate contact time as indicated for disinfecting solutions.  ? ?

## 2022-02-13 NOTE — Assessment & Plan Note (Signed)
She is very diligent about avoiding products containing gluten.  Checking additional lab work for nutritional deficiencies. ?

## 2022-02-24 LAB — T4, FREE: Free T4: 1.8 ng/dL (ref 0.8–1.8)

## 2022-02-24 LAB — T3, FREE: T3, Free: 3.9 pg/mL (ref 2.3–4.2)

## 2022-02-24 LAB — TSH: TSH: 0.23 mIU/L — ABNORMAL LOW

## 2022-03-01 ENCOUNTER — Other Ambulatory Visit: Payer: Self-pay | Admitting: Family Medicine

## 2022-03-01 MED ORDER — LEVOTHYROXINE SODIUM 125 MCG PO TABS: ORAL_TABLET | ORAL | 2 refills | Status: DC

## 2022-03-21 ENCOUNTER — Other Ambulatory Visit: Payer: Self-pay | Admitting: Family Medicine

## 2022-03-25 ENCOUNTER — Encounter: Payer: Self-pay | Admitting: Family Medicine

## 2022-03-25 DIAGNOSIS — R5383 Other fatigue: Secondary | ICD-10-CM

## 2022-04-06 ENCOUNTER — Encounter: Payer: Self-pay | Admitting: Family Medicine

## 2022-04-06 MED ORDER — PREDNISONE 10 MG (48) PO TBPK: ORAL_TABLET | Freq: Every day | ORAL | 0 refills | Status: DC

## 2022-04-06 MED ORDER — BUDESONIDE 1 MG/2ML IN SUSP: 1.0000 mg | Freq: Two times a day (BID) | RESPIRATORY_TRACT | 1 refills | Status: DC

## 2022-05-04 ENCOUNTER — Other Ambulatory Visit: Payer: Self-pay | Admitting: Family Medicine

## 2022-07-22 ENCOUNTER — Ambulatory Visit: Payer: Managed Care, Other (non HMO) | Admitting: Physician Assistant

## 2022-08-19 ENCOUNTER — Ambulatory Visit
Admission: EM | Admit: 2022-08-19 | Discharge: 2022-08-19 | Disposition: A | Discharge status: Home / Self Care | Payer: Managed Care, Other (non HMO) | Attending: Family Medicine | Admitting: Family Medicine

## 2022-08-19 ENCOUNTER — Encounter: Payer: Self-pay | Admitting: Emergency Medicine

## 2022-08-19 DIAGNOSIS — Z760 Encounter for issue of repeat prescription: Secondary | ICD-10-CM | POA: Diagnosis not present

## 2022-08-19 DIAGNOSIS — F32A Depression, unspecified: Secondary | ICD-10-CM | POA: Insufficient documentation

## 2022-08-19 DIAGNOSIS — O99891 Other specified diseases and conditions complicating pregnancy: Secondary | ICD-10-CM | POA: Insufficient documentation

## 2022-08-19 DIAGNOSIS — N3001 Acute cystitis with hematuria: Secondary | ICD-10-CM | POA: Insufficient documentation

## 2022-08-19 DIAGNOSIS — F419 Anxiety disorder, unspecified: Secondary | ICD-10-CM | POA: Diagnosis not present

## 2022-08-19 LAB — POCT URINALYSIS DIP (MANUAL ENTRY)
Bilirubin, UA: NEGATIVE
Glucose, UA: NEGATIVE mg/dL
Ketones, POC UA: NEGATIVE mg/dL
Nitrite, UA: NEGATIVE
Protein Ur, POC: NEGATIVE mg/dL
Spec Grav, UA: 1.025 (ref 1.010–1.025)
Urobilinogen, UA: 0.2 E.U./dL
pH, UA: 7.5 (ref 5.0–8.0)

## 2022-08-19 MED ORDER — CIPROFLOXACIN HCL 250 MG PO TABS: 250.0000 mg | ORAL_TABLET | Freq: Two times a day (BID) | ORAL | 0 refills | Status: AC

## 2022-08-19 MED ORDER — BUDESONIDE 1 MG/2ML IN SUSP: 1.0000 mg | Freq: Two times a day (BID) | RESPIRATORY_TRACT | 0 refills | Status: DC

## 2022-08-19 NOTE — Discharge Instructions (Addendum)
Advised patient to take medication as directed with food to completion.  Encouraged patient to increase daily water intake while taking this medication.  Advised patient we will follow-up with urine culture results once received.  Advised if symptoms worsen and/or unresolved please follow-up with PCP or here for further evaluation.

## 2022-08-19 NOTE — ED Provider Notes (Signed)
[facility]    CSN: [id number] Arrival date & time: 08/19/22  0839      History   Chief Complaint Chief Complaint  Patient presents with   Dysuria    HPI Emily [name] is a 41 y.o. female.   HPI Very pleasant 41 year old female presents with dysuria for 1 week.  PMH significant for chronic colitis, history of anemia, and anxiety and depression.  Additionally, patient request refill of her Pulmicort nebulizer.  Past Medical History:  Diagnosis Date   Anemia    history   Bartholin cyst 12/27/2011   Celiac disease    Collagenous colitis    Delivery of twins, both live 12/02/2012   Dichorionic diamniotic twin pregnancy 12/01/2012   Headache(784.0)    complex migraines with cardiac and neuro symptoms during pregnancy   Hypothyroidism    Infertility, female    SVD (spontaneous vaginal delivery) 12/02/2012   Thyroid disease     Patient Active Problem List   Diagnosis Date Noted   Microscopic colitis 01/11/2022   Gastroparesis 05/17/2021   Diarrhea 05/17/2021   Cystitis 08/12/2020   Celiac disease/sprue 09/06/2019   GI problem 03/13/2019   Candidiasis of vagina 11/28/2018   Candidiasis of skin 11/28/2018   Chronic colitis 11/28/2018   History of miscarriage 11/28/2018   Nausea 11/28/2018   Pregnancy with isoimmunization 11/28/2018   Palpitations 11/16/2018   PVC (premature ventricular contraction) 11/16/2018   PAC (premature atrial contraction) 11/16/2018   Sinus tachycardia seen on cardiac monitor 04/06/2018   History of colonoscopy 03/31/2016   Cervical cancer screening 03/31/2016   History of celiac disease 03/31/2016   Hypothyroid 03/31/2016   Anxiety and depression 03/31/2016   History of anemia 03/31/2016   Tachycardia with heart rate 100-120 beats per minute 11/13/2012   Hypokalemia 76/72/0947   Complicated migraine 09/62/8366   Bartholin cyst 12/27/2011   Female infertility 10/24/2011    Past Surgical History:  Procedure Laterality  Date   BARTHOLIN CYST MARSUPIALIZATION  12/28/2011   Procedure: BARTHOLIN CYST MARSUPIALIZATION;  Surgeon: Thornell Sartorius, MD;  Location: [facility];  Service: Gynecology;  Laterality: N/A;   COLONOSCOPY     RHINOPLASTY     TONSILLECTOMY     UPPER GASTROINTESTINAL ENDOSCOPY     WISDOM TOOTH EXTRACTION      OB History     Gravida  3   Para  2   Term  2   Preterm      AB      Living  3      SAB      IAB      Ectopic      Multiple  1   Live Births  3            Home Medications    Prior to Admission medications   Medication Sig Start Date End Date Taking? Authorizing Provider  ciprofloxacin (CIPRO) 250 MG tablet Take 1 tablet (250 mg total) by mouth 2 (two) times daily for 5 days. 08/19/22 08/24/22 Yes Eliezer Lofts, FNP  ALPRAZolam Duanne Moron) 0.25 MG tablet Take 1 tablet (0.25 mg total) by mouth daily as needed for anxiety (use sparingly to avoid dependence). 03/23/18   Emeterio Reeve, DO  budesonide (PULMICORT) 1 MG/2ML nebulizer solution Take 2 mLs (1 mg total) by nebulization in the morning and at bedtime. 08/19/22 09/18/22  Eliezer Lofts, FNP  Budesonide ER 9 MG TB24 Take by mouth.    [provider]  desvenlafaxine (PRISTIQ) 25 MG 24 hr  tablet TAKE 1 TABLET (25 MG TOTAL) BY MOUTH DAILY. Patient not taking: Reported on 08/19/2022 02/10/22   Luetta [place]ting, DO  diphenoxylate-atropine (LOMOTIL) 2.5-0.025 MG tablet Take daily as needed by mouth.    [provider]  escitalopram (LEXAPRO) 5 MG tablet TAKE 1 TAB DAILY Patient not taking: Reported on 08/19/2022 03/21/22   Luetta Nutting, DO  levothyroxine (UNITHROID) 125 MCG tablet TAKE 1 TABLET (137 MCG TOTAL) BY MOUTH DAILY BEFORE BREAKFAST. 03/01/22   Luetta Nutting, DO  meclizine (ANTIVERT) 25 MG tablet Take 1 tablet (25 mg total) by mouth 3 (three) times daily as needed for dizziness. 02/09/22   Luetta Nutting, DO  ondansetron (ZOFRAN) 4 MG tablet Take 4 mg by mouth as needed. 06/04/10   [provider]   predniSONE (STERAPRED UNI-PAK 48 TAB) 10 MG (48) TBPK tablet Take by mouth daily. 12-Day taper, po 04/06/22   Luetta Nutting, DO  amitriptyline (ELAVIL) 10 MG tablet Take 1-2 tablets (10-20 mg total) by mouth at bedtime. 11/01/19 05/14/21  Emeterio Reeve, DO  naratriptan (AMERGE) 1 MG TABS tablet Take by mouth. 03/06/20 05/14/21  [provider]    Family History Family History  Problem Relation Age of Onset   Healthy Mother    Healthy Father    Heart attack Maternal Grandmother    Rashes / Skin problems Paternal Grandmother    Clotting disorder Paternal Uncle     Social History Social History   Tobacco Use   Smoking status: Never   Smokeless tobacco: Never  Vaping Use   Vaping Use: Never used  Substance Use Topics   Alcohol use: Yes    Alcohol/week: 5.0 standard drinks of alcohol    Types: 5 Standard drinks or equivalent per week   Drug use: No     Allergies   Amoxicillin-pot clavulanate, Promethazine, Rifaximin, Benzonatate, Clavulanic acid, Gluten meal, Levothyroxine, Rifaximin, Ceftriaxone, Epinephrine, Tramadol, Dextromethorphan, and Levothyroxine sodium   Review of Systems Review of Systems  Genitourinary:  Positive for dysuria.  All other systems reviewed and are negative.    Physical Exam Triage Vital Signs ED Triage Vitals  Enc Vitals Group     BP      Pulse      Resp      Temp      Temp src      SpO2      Weight      Height      Head Circumference      Peak Flow      Pain Score      Pain Loc      Pain Edu?      Excl. in [place]?    No data found.  Updated Vital Signs BP 118/86 (BP Location: Left Arm)   Pulse 82   Temp 99.5 F (37.5 C) (Oral)   Resp 14   Ht 5' 6"  (1.676 m)   Wt 104 lb (47.2 kg)   LMP 07/29/2022 (Exact Date)   SpO2 99%   BMI 16.79 kg/m    Physical Exam Vitals and nursing note reviewed.  Constitutional:      General: She is not in acute distress.    Appearance: Normal appearance. She is normal weight. She is  not ill-appearing.  HENT:     Head: Normocephalic and atraumatic.     Right Ear: Tympanic membrane, ear canal and external ear normal.     Left Ear: Tympanic membrane, ear canal and external ear normal.  Mouth/Throat:     Mouth: Mucous membranes are moist.     Pharynx: Oropharynx is clear.  Eyes:     Extraocular Movements: Extraocular movements intact.     Conjunctiva/sclera: Conjunctivae normal.     Pupils: Pupils are equal, round, and reactive to light.  Cardiovascular:     Rate and Rhythm: Normal rate and regular rhythm.     Pulses: Normal pulses.     Heart sounds: Normal heart sounds. No murmur heard. Pulmonary:     Effort: Pulmonary effort is normal.     Breath sounds: Normal breath sounds. No wheezing or rhonchi.  Musculoskeletal:        General: Normal range of motion.     Cervical back: Normal range of motion and neck supple. No tenderness.  Lymphadenopathy:     Cervical: No cervical adenopathy.  Skin:    General: Skin is warm and dry.  Neurological:     General: No focal deficit present.     Mental Status: She is alert and oriented to person, place, and time. Mental status is at baseline.      UC Treatments / Results  Labs (all labs ordered are listed, but only abnormal results are displayed) Labs Reviewed  POCT URINALYSIS DIP (MANUAL ENTRY) - Abnormal; Notable for the following components:      Result Value   Clarity, UA cloudy (*)    Blood, UA trace-intact (*)    Leukocytes, UA Trace (*)    All other components within normal limits  URINE CULTURE    EKG   Radiology No results found.  Procedures Procedures (including critical care time)  Medications Ordered in UC Medications - No data to display  Initial Impression / Assessment and Plan / UC Course  I have reviewed the triage vital signs and the nursing notes.  Pertinent labs & imaging results that were available during my care of the patient were reviewed by me and considered in my medical  decision making (see chart for details).     MDM: 1.  Acute cystitis with hematuria-Rx'd Cipro,; 2.  Medication refill-Pulmicort refilled per request. Advised patient to take medication as directed with food to completion.  Encouraged patient to increase daily water intake while taking this medication.  Advised patient we will follow-up with urine culture results once received.  Advised if symptoms worsen and/or unresolved please follow-up with PCP or here for further evaluation.  Patient discharged home, hemodynamically stable. Final Clinical Impressions(s) / UC Diagnoses   Final diagnoses:  Acute cystitis with hematuria  Medication refill     Discharge Instructions      Advised patient to take medication as directed with food to completion.  Encouraged patient to increase daily water intake while taking this medication.  Advised patient we will follow-up with urine culture results once received.  Advised if symptoms worsen and/or unresolved please follow-up with PCP or here for further evaluation.     ED Prescriptions     Medication Sig Dispense Auth. Provider   budesonide (PULMICORT) 1 MG/2ML nebulizer solution Take 2 mLs (1 mg total) by nebulization in the morning and at bedtime. 120 mL Eliezer Lofts, FNP   ciprofloxacin (CIPRO) 250 MG tablet Take 1 tablet (250 mg total) by mouth 2 (two) times daily for 5 days. 10 tablet Eliezer Lofts, FNP      PDMP not reviewed this encounter.   Eliezer Lofts, [place] 08/19/22 1000

## 2022-08-19 NOTE — ED Triage Notes (Signed)
Dysuria x 1 week  Denies  fever or back pain  Increased fluids at home No OTC  meds

## 2022-08-20 ENCOUNTER — Telehealth: Payer: Self-pay

## 2022-08-20 NOTE — Telephone Encounter (Signed)
Called pt to f/u with recent visit. Pt was unavailable on attempt to contact. Left pt a VM  instructing her to give [place] a call back if she had any questions or concerns regarding her visit.

## 2022-08-21 LAB — URINE CULTURE: Culture: >=100000 — AB

## 2022-08-22 ENCOUNTER — Ambulatory Visit: Payer: Managed Care, Other (non HMO) | Admitting: Family Medicine

## 2022-08-22 ENCOUNTER — Encounter: Payer: Self-pay | Admitting: Family Medicine

## 2022-08-22 VITALS — BP 105/69 | HR 71 | Ht 66.0 in | Wt 107.0 lb

## 2022-08-22 DIAGNOSIS — F419 Anxiety disorder, unspecified: Secondary | ICD-10-CM

## 2022-08-22 DIAGNOSIS — K52839 Microscopic colitis, unspecified: Secondary | ICD-10-CM

## 2022-08-22 DIAGNOSIS — R000 Tachycardia, unspecified: Secondary | ICD-10-CM

## 2022-08-22 DIAGNOSIS — N3090 Cystitis, unspecified without hematuria: Secondary | ICD-10-CM

## 2022-08-22 DIAGNOSIS — I471 Supraventricular tachycardia, unspecified: Secondary | ICD-10-CM | POA: Insufficient documentation

## 2022-08-22 DIAGNOSIS — F32A Depression, unspecified: Secondary | ICD-10-CM

## 2022-08-22 LAB — POCT URINALYSIS DIP (CLINITEK)
Bilirubin, UA: NEGATIVE
Blood, UA: NEGATIVE
Glucose, UA: NEGATIVE mg/dL
Ketones, POC UA: NEGATIVE mg/dL
Leukocytes, UA: NEGATIVE
Nitrite, UA: NEGATIVE
POC PROTEIN,UA: NEGATIVE
Spec Grav, UA: 1.025 (ref 1.010–1.025)
Urobilinogen, UA: 0.2 E.U./dL
pH, UA: 7 (ref 5.0–8.0)

## 2022-08-22 MED ORDER — BUPROPION HCL ER (XL) 150 MG PO TB24: 150.0000 mg | ORAL_TABLET | Freq: Every day | ORAL | 3 refills | Status: DC

## 2022-08-22 NOTE — Assessment & Plan Note (Signed)
She has tried multiple medications.  Concerned about serotonin syndrome with typical SSRI as she has had this in the past.  Will try bupropion XL 116m daily.

## 2022-08-22 NOTE — Assessment & Plan Note (Signed)
Complete cipro. Contact clinic if symptoms return.

## 2022-08-22 NOTE — Assessment & Plan Note (Signed)
She has had some increased tachycardia while running but denies symptoms and HR returns to normal.  She may continue running.

## 2022-08-22 NOTE — Patient Instructions (Addendum)
Great to see you today! Complete cipro.  Let me know if symptoms return.

## 2022-08-22 NOTE — Progress Notes (Signed)
Emily [name] - 41 y.o. female MRN [id number]  Date of birth: 22-Jun-1981  Subjective Chief Complaint  Patient presents with   Urinary Tract Infection   Tachycardia    HPI Emily [name] is a 41 y.o. female here today with complaint of possible UTI and tachycardia.   She had dysuria recently and recently seen at urgent care.   UA consistent with UTI and started on cipro.  Culture with pansensitive Citrobacter.  She reports that her symptoms have improved.    She denies side effects with cipro.    She has been using boric acid for BV.  This does seem to help.  Continues to have depression and anxiety.  She has tried multiple medications. Most recently prescribed lexapro but discontinued due to concern about developing serotonin syndrome with zofran.    Additionally she has started running regularly.  She has history of tachycardia and has noted that her HR has been elevated to 190 when running.  HR recovers to normal rate with rest.    ROS:  A comprehensive ROS was completed and negative except as noted per HPI  Allergies  Allergen Reactions   Amoxicillin-Pot Clavulanate Anaphylaxis, Hives and Itching   Promethazine Swelling   Rifaximin Anaphylaxis, Itching and Rash        Benzonatate Other (See Comments)    Lump on throat   Clavulanic Acid Other (See Comments)    Flushing   Gluten Meal Diarrhea, Nausea And Vomiting and Other (See Comments)    GI Upset   Levothyroxine Diarrhea and Other (See Comments)    Only has GI reaction to synthroid brand   Rifaximin Itching    Itching of throat   Ceftriaxone Other (See Comments)    "Made me collapse"   Epinephrine Other (See Comments)    SVT    Tramadol    Dextromethorphan Other (See Comments)   Levothyroxine Sodium Diarrhea    Only has GI reaction to synthroid brand    Past Medical History:  Diagnosis Date   Anemia    history   Bartholin cyst 12/27/2011   Celiac disease    Collagenous colitis    Delivery of twins, both  live 12/02/2012   Dichorionic diamniotic twin pregnancy 12/01/2012   Headache(784.0)    complex migraines with cardiac and neuro symptoms during pregnancy   Hypothyroidism    Infertility, female    SVD (spontaneous vaginal delivery) 12/02/2012   Thyroid disease     Past Surgical History:  Procedure Laterality Date   BARTHOLIN CYST MARSUPIALIZATION  12/28/2011   Procedure: BARTHOLIN CYST MARSUPIALIZATION;  Surgeon: Thornell Sartorius, MD;  Location: [facility];  Service: Gynecology;  Laterality: N/A;   COLONOSCOPY     RHINOPLASTY     TONSILLECTOMY     UPPER GASTROINTESTINAL ENDOSCOPY     WISDOM TOOTH EXTRACTION      Social History   Socioeconomic History   Marital status: Married    Spouse name: Not on file   Number of children: Not on file   Years of education: Not on file   Highest education level: Not on file  Occupational History   Not on file  Tobacco Use   Smoking status: Never   Smokeless tobacco: Never  Vaping Use   Vaping Use: Never used  Substance and Sexual Activity   Alcohol use: Yes    Alcohol/week: 5.0 standard drinks of alcohol    Types: 5 Standard drinks or equivalent per week   Drug use: No   Sexual  activity: Yes  Other Topics Concern   Not on file  Social History Narrative   Not on file   Social Determinants of Health   Financial Resource Strain: Not on file  Food Insecurity: Not on file  Transportation Needs: Not on file  Physical Activity: Not on file  Stress: Not on file  Social Connections: Not on file    Family History  Problem Relation Age of Onset   Healthy Mother    Healthy Father    Heart attack Maternal Grandmother    Rashes / Skin problems Paternal Grandmother    Clotting disorder Paternal Uncle     Health Maintenance  Topic Date Due   COVID-19 Vaccine (3 - Pfizer risk series) 01/12/2023 (Originally 04/21/2020)   INFLUENZA VACCINE  03/12/2023 (Originally 07/12/2022)   Hepatitis C Screening  08/23/2023 (Originally 05/11/2001)   PAP  SMEAR-Modifier  07/07/2025   TETANUS/TDAP  10/27/2025   HIV Screening  Completed   HPV VACCINES  Aged Out     ----------------------------------------------------------------------------------------------------------------------------------------------------------------------------------------------------------------- Physical Exam BP 105/69 (BP Location: Left Arm, Patient Position: Sitting, Cuff Size: Small)   Pulse 71   Ht 5' 6"  (1.676 m)   Wt 107 lb (48.5 kg)   LMP 07/29/2022 (Exact Date)   SpO2 100%   BMI 17.27 kg/m   Physical Exam Constitutional:      Appearance: Normal appearance.  Eyes:     General: No scleral icterus. Cardiovascular:     Rate and Rhythm: Normal rate and regular rhythm.  Pulmonary:     Effort: Pulmonary effort is normal.     Breath sounds: Normal breath sounds.  Musculoskeletal:     Cervical back: Neck supple.  Neurological:     Mental Status: She is alert.  Psychiatric:        Mood and Affect: Mood normal.        Behavior: Behavior normal.     ------------------------------------------------------------------------------------------------------------------------------------------------------------------------------------------------------------------- Assessment and Plan  Anxiety and depression She has tried multiple medications.  Concerned about serotonin syndrome with typical SSRI as she has had this in the past.  Will try bupropion XL 155m daily.    Sinus tachycardia seen on cardiac monitor She has had some increased tachycardia while running but denies symptoms and HR returns to normal.  She may continue running.    Cystitis Complete cipro. Contact clinic if symptoms return.    Meds ordered this encounter  Medications   buPROPion (WELLBUTRIN XL) 150 MG 24 hr tablet    Sig: Take 1 tablet (150 mg total) by mouth daily.    Dispense:  30 tablet    Refill:  3    No follow-ups on file.    This visit occurred during the SARS-CoV-2  public health emergency.  Safety protocols were in place, including screening questions prior to the visit, additional usage of staff PPE, and extensive cleaning of exam room while observing appropriate contact time as indicated for disinfecting solutions.

## 2022-09-01 ENCOUNTER — Encounter: Payer: Self-pay | Admitting: Family Medicine

## 2022-09-06 ENCOUNTER — Other Ambulatory Visit: Payer: Self-pay | Admitting: Family Medicine

## 2022-09-27 ENCOUNTER — Encounter: Payer: Self-pay | Admitting: Family Medicine

## 2022-09-27 DIAGNOSIS — G43109 Migraine with aura, not intractable, without status migrainosus: Secondary | ICD-10-CM | POA: Diagnosis not present

## 2022-09-30 NOTE — Assessment & Plan Note (Signed)
Hemiplegic migraines, needs referral back to neurology. Did have Wellbutrin started, and developed another migrainous type process with numbness on one side of the body, headache, and "speaking gibberish" which could potentially represent a Wernicke's aphasia.  Wellbutrin can decrease seizure threshold so I am okay with her stopping this until we can get a second opinion from neurology.

## 2022-09-30 NOTE — Telephone Encounter (Signed)
I spent 5 total minutes of online digital evaluation and management services in this patient-initiated request for online care.  Complicated migraine Hemiplegic migraines, needs referral back to neurology. Did have Wellbutrin started, and developed another migrainous type process with numbness on one side of the body, headache, and "speaking gibberish" which could potentially represent a Wernicke's aphasia.  Wellbutrin can decrease seizure threshold so I am okay with her stopping this until we can get a second opinion from neurology.

## 2022-10-28 ENCOUNTER — Telehealth: Payer: Self-pay

## 2022-10-31 NOTE — Telephone Encounter (Signed)
Pt Emily [name] stating she sent a message to the practice concerning her referral to Neurology. She received a callback from Dr. Darene Lamer and a bill for the call. Patient would like clarification.

## 2022-12-19 ENCOUNTER — Telehealth: Payer: Managed Care, Other (non HMO) | Admitting: Physician Assistant

## 2022-12-19 DIAGNOSIS — H109 Unspecified conjunctivitis: Secondary | ICD-10-CM | POA: Diagnosis not present

## 2022-12-19 DIAGNOSIS — B9689 Other specified bacterial agents as the cause of diseases classified elsewhere: Secondary | ICD-10-CM

## 2022-12-19 MED ORDER — TOBRAMYCIN 0.3 % OP SOLN: 1.0000 [drp] | OPHTHALMIC | 0 refills | Status: DC

## 2022-12-19 NOTE — Progress Notes (Signed)
E-Visit for [employer]   We are sorry that you are not feeling well.  Here is how we plan to help!  Based on what you have shared with me it looks like you have conjunctivitis.  Conjunctivitis is a common inflammatory or infectious condition of the eye that is often referred to as "pink eye".  In most cases it is contagious (viral or bacterial). However, not all conjunctivitis requires antibiotics (ex. Allergic).  We have made appropriate suggestions for you based upon your presentation.  Tobramycin ophthalmic solution Place 1 drop in each eye every 4 hours, while awake, for 5 days  Pink eye can be highly contagious.  It is typically spread through direct contact with secretions, or contaminated objects or surfaces that one may have touched.  Strict handwashing is suggested with soap and water is urged.  If not available, use alcohol based had sanitizer.  Avoid unnecessary touching of the eye.  If you wear contact lenses, you will need to refrain from wearing them until you see no white discharge from the eye for at least 24 hours after being on medication.  You should see symptom improvement in 1-2 days after starting the medication regimen.  Call us if symptoms are not improved in 1-2 days.  Home Care: Wash your hands often! Do not wear your contacts until you complete your treatment plan. Avoid sharing towels, bed linen, personal items with a person who has pink eye. See attention for anyone in your home with similar symptoms.  Get Help Right Away If: Your symptoms do not improve. You develop blurred or loss of vision. Your symptoms worsen (increased discharge, pain or redness)   Thank you for choosing an e-visit.  Your e-visit answers were reviewed by a board certified advanced clinical practitioner to complete your personal care plan. Depending upon the condition, your plan could have included both over the counter or prescription medications.  Please review your pharmacy choice. Make  sure the pharmacy is open so you can pick up prescription now. If there is a problem, you may contact your provider through [employer] and have the prescription routed to another pharmacy.  Your safety is important to [place]. If you have drug allergies check your prescription carefully.   For the next 24 hours you can use MyChart to ask questions about today's visit, request a non-urgent call back, or ask for a work or school excuse. You will get an email in the next two days asking about your experience. I hope that your e-visit has been valuable and will speed your recovery.   I have spent 5 minutes in review of e-visit questionnaire, review and updating patient chart, medical decision making and response to patient.   Mar Daring, PA-C

## 2022-12-21 ENCOUNTER — Telehealth: Payer: Managed Care, Other (non HMO) | Admitting: Nurse Practitioner

## 2022-12-21 DIAGNOSIS — N3000 Acute cystitis without hematuria: Secondary | ICD-10-CM

## 2022-12-21 MED ORDER — NITROFURANTOIN MONOHYD MACRO 100 MG PO CAPS: 100.0000 mg | ORAL_CAPSULE | Freq: Two times a day (BID) | ORAL | 0 refills | Status: AC

## 2022-12-21 NOTE — Progress Notes (Signed)
E-Visit for Urinary Problems  We are sorry that you are not feeling well.  Here is how we plan to help!  Based on what you shared with me it looks like you most likely have a simple urinary tract infection.  A UTI (Urinary Tract Infection) is a bacterial infection of the bladder.  Most cases of urinary tract infections are simple to treat but a key part of your care is to encourage you to drink plenty of fluids and watch your symptoms carefully.  I have prescribed MacroBid 100 mg twice a day for 5 days.  This medication is typically well tolerated, please let us know if you have any side effects.   Your symptoms should gradually improve. Call us if the burning in your urine worsens, you develop worsening fever, back pain or pelvic pain or if your symptoms do not resolve after completing the antibiotic.  Urinary tract infections can be prevented by drinking plenty of water to keep your body hydrated.  Also be sure when you wipe, wipe from front to back and don't hold it in!  If possible, empty your bladder every 4 hours.  HOME CARE Drink plenty of fluids Compete the full course of the antibiotics even if the symptoms resolve Remember, when you need to go.go. Holding in your urine can increase the likelihood of getting a UTI! GET HELP RIGHT AWAY IF: You cannot urinate You get a high fever Worsening back pain occurs You see blood in your urine You feel sick to your stomach or throw up You feel like you are going to pass out  MAKE SURE YOU  Understand these instructions. Will watch your condition. Will get help right away if you are not doing well or get worse.   Thank you for choosing an e-visit.  Your e-visit answers were reviewed by a board certified advanced clinical practitioner to complete your personal care plan. Depending upon the condition, your plan could have included both over the counter or prescription medications.  Please review your pharmacy choice. Make sure the  pharmacy is open so you can pick up prescription now. If there is a problem, you may contact your provider through [employer] and have the prescription routed to another pharmacy.  Your safety is important to [place]. If you have drug allergies check your prescription carefully.   For the next 24 hours you can use MyChart to ask questions about today's visit, request a non-urgent call back, or ask for a work or school excuse. You will get an email in the next two days asking about your experience. I hope that your e-visit has been valuable and will speed your recovery.   Meds ordered this encounter  Medications   nitrofurantoin, macrocrystal-monohydrate, (MACROBID) 100 MG capsule    Sig: Take 1 capsule (100 mg total) by mouth 2 (two) times daily for 5 days.    Dispense:  10 capsule    Refill:  0    I spent approximately 5 minutes reviewing the patient's history, current symptoms and coordinating their care today.

## 2023-02-17 ENCOUNTER — Telehealth: Payer: Managed Care, Other (non HMO) | Admitting: Nurse Practitioner

## 2023-02-17 DIAGNOSIS — N3000 Acute cystitis without hematuria: Secondary | ICD-10-CM

## 2023-02-18 MED ORDER — SULFAMETHOXAZOLE-TRIMETHOPRIM 800-160 MG PO TABS: 1.0000 | ORAL_TABLET | Freq: Two times a day (BID) | ORAL | 0 refills | Status: DC

## 2023-02-18 NOTE — Progress Notes (Signed)
E-Visit for Urinary Problems  We are sorry that you are not feeling well.  Here is how we plan to help!  Based on what you shared with me it looks like you most likely have a simple urinary tract infection.  A UTI (Urinary Tract Infection) is a bacterial infection of the bladder.  Most cases of urinary tract infections are simple to treat but a key part of your care is to encourage you to drink plenty of fluids and watch your symptoms carefully.  I have prescribed Bactrim DS One tablet twice a day for 5 days.  Your symptoms should gradually improve. Call us if the burning in your urine worsens, you develop worsening fever, back pain or pelvic pain or if your symptoms do not resolve after completing the antibiotic.  Urinary tract infections can be prevented by drinking plenty of water to keep your body hydrated.  Also be sure when you wipe, wipe from front to back and don't hold it in!  If possible, empty your bladder every 4 hours.  HOME CARE Drink plenty of fluids Compete the full course of the antibiotics even if the symptoms resolve Remember, when you need to go.go. Holding in your urine can increase the likelihood of getting a UTI! GET HELP RIGHT AWAY IF: You cannot urinate You get a high fever Worsening back pain occurs You see blood in your urine You feel sick to your stomach or throw up You feel like you are going to pass out  MAKE SURE YOU  Understand these instructions. Will watch your condition. Will get help right away if you are not doing well or get worse.   Thank you for choosing an e-visit.  Your e-visit answers were reviewed by a board certified advanced clinical practitioner to complete your personal care plan. Depending upon the condition, your plan could have included both over the counter or prescription medications.  Please review your pharmacy choice. Make sure the pharmacy is open so you can pick up prescription now. If there is a problem, you may contact  your provider through [employer] and have the prescription routed to another pharmacy.  Your safety is important to [place]. If you have drug allergies check your prescription carefully.   For the next 24 hours you can use MyChart to ask questions about today's visit, request a non-urgent call back, or ask for a work or school excuse. You will get an email in the next two days asking about your experience. I hope that your e-visit has been valuable and will speed your recovery.  Mary-Margaret Hassell Done, FNP   5-10 minutes spent reviewing and documenting in chart.

## 2023-03-09 ENCOUNTER — Ambulatory Visit: Payer: Managed Care, Other (non HMO) | Admitting: Family Medicine

## 2023-03-09 ENCOUNTER — Encounter: Payer: Self-pay | Admitting: Family Medicine

## 2023-03-09 VITALS — BP 118/68 | HR 98 | Ht 66.0 in | Wt 103.0 lb

## 2023-03-09 DIAGNOSIS — K900 Celiac disease: Secondary | ICD-10-CM

## 2023-03-09 DIAGNOSIS — F32A Depression, unspecified: Secondary | ICD-10-CM

## 2023-03-09 DIAGNOSIS — F419 Anxiety disorder, unspecified: Secondary | ICD-10-CM | POA: Diagnosis not present

## 2023-03-09 DIAGNOSIS — E039 Hypothyroidism, unspecified: Secondary | ICD-10-CM | POA: Diagnosis not present

## 2023-03-09 LAB — T4, FREE: Free T4: 1.5 ng/dL (ref 0.8–1.8)

## 2023-03-09 LAB — TSH: TSH: 1.57 mIU/L

## 2023-03-09 LAB — T3, FREE: T3, Free: 3.4 pg/mL (ref 2.3–4.2)

## 2023-03-09 MED ORDER — BUPROPION HCL ER (XL) 150 MG PO TB24: 150.0000 mg | ORAL_TABLET | Freq: Two times a day (BID) | ORAL | 2 refills | Status: DC

## 2023-03-09 NOTE — Patient Instructions (Signed)
Great to see you today! Let's try bupropion 150mg  twice per day.   Let me know how this is working for you.  See me again in about 6 months.

## 2023-03-10 ENCOUNTER — Encounter: Payer: Self-pay | Admitting: Family Medicine

## 2023-03-10 NOTE — Assessment & Plan Note (Signed)
Management per GI.  Avoidance of gluten-containing products.  Stable at this time.

## 2023-03-10 NOTE — Progress Notes (Signed)
Emily [name] - 42 y.o. female MRN [id number]  Date of birth: 16-Jun-1981  Subjective No chief complaint on file.   HPI Emily [name] is a 42 year old female here today for follow-up visit.  Overall she reports she is doing pretty well.  Tolerating bupropion at current strength.  She does feel like she needs to increase this time but is concerned about running into side effects with increase in strength.  She has tried multiple SSRIs in the past with poor tolerance or limited response.  She continues on levothyroxine.  Feels pretty well at current strength.  She is due to have updated labs.  Followed by GI for celiac disease and microscopic colitis.  Stable at this time.  ROS:  A comprehensive ROS was completed and negative except as noted per HPI  Allergies  Allergen Reactions   Amoxicillin-Pot Clavulanate Anaphylaxis, Hives and Itching   Promethazine Swelling   Rifaximin Anaphylaxis, Itching and Rash        Benzonatate Other (See Comments)    Lump on throat   Clavulanic Acid Other (See Comments)    Flushing   Gluten Meal Diarrhea, Nausea And Vomiting and Other (See Comments)    GI Upset   Levothyroxine Diarrhea and Other (See Comments)    Only has GI reaction to synthroid brand   Rifaximin Itching    Itching of throat   Ceftriaxone Other (See Comments)    "Made me collapse"   Epinephrine Other (See Comments)    SVT    Tramadol    Dextromethorphan Other (See Comments)   Levothyroxine Sodium Diarrhea    Only has GI reaction to synthroid brand    Past Medical History:  Diagnosis Date   Anemia    history   Bartholin cyst 12/27/2011   Celiac disease    Collagenous colitis    Delivery of twins, both live 12/02/2012   Dichorionic diamniotic twin pregnancy 12/01/2012   Headache(784.0)    complex migraines with cardiac and neuro symptoms during pregnancy   Hypothyroidism    Infertility, female    SVD (spontaneous vaginal delivery) 12/02/2012   Thyroid disease     Past  Surgical History:  Procedure Laterality Date   BARTHOLIN CYST MARSUPIALIZATION  12/28/2011   Procedure: BARTHOLIN CYST MARSUPIALIZATION;  Surgeon: Thornell Sartorius, MD;  Location: [facility];  Service: Gynecology;  Laterality: N/A;   COLONOSCOPY     RHINOPLASTY     TONSILLECTOMY     UPPER GASTROINTESTINAL ENDOSCOPY     WISDOM TOOTH EXTRACTION      Social History   Socioeconomic History   Marital status: Legally Separated    Spouse name: Not on file   Number of children: Not on file   Years of education: Not on file   Highest education level: Not on file  Occupational History   Not on file  Tobacco Use   Smoking status: Never   Smokeless tobacco: Never  Vaping Use   Vaping Use: Never used  Substance and Sexual Activity   Alcohol use: Yes    Alcohol/week: 5.0 standard drinks of alcohol    Types: 5 Standard drinks or equivalent per week   Drug use: No   Sexual activity: Yes  Other Topics Concern   Not on file  Social History Narrative   Not on file   Social Determinants of Health   Financial Resource Strain: Not on file  Food Insecurity: Not on file  Transportation Needs: Not on file  Physical Activity: Not on file  Stress:  Not on file  Social Connections: Not on file    Family History  Problem Relation Age of Onset   Healthy Mother    Healthy Father    Heart attack Maternal Grandmother    Rashes / Skin problems Paternal Grandmother    Clotting disorder Paternal Uncle     Health Maintenance  Topic Date Due   COVID-19 Vaccine (3 - Pfizer risk series) 04/21/2020   INFLUENZA VACCINE  03/12/2023 (Originally 07/12/2022)   Hepatitis C Screening  08/23/2023 (Originally 05/11/2001)   PAP SMEAR-Modifier  07/07/2025   DTaP/Tdap/Td (2 - Td or Tdap) 10/27/2025   HIV Screening  Completed   HPV VACCINES  Aged Out      ----------------------------------------------------------------------------------------------------------------------------------------------------------------------------------------------------------------- Physical Exam BP 118/68   Pulse 98   Ht 5\' 6"  (1.676 m)   Wt 103 lb (46.7 kg)   SpO2 100%   BMI 16.62 kg/m   Physical Exam Constitutional:      Appearance: Normal appearance.  HENT:     Head: Normocephalic and atraumatic.  Eyes:     General: No scleral icterus. Neurological:     Mental Status: She is alert.  Psychiatric:        Mood and Affect: Mood normal.        Behavior: Behavior normal.     ------------------------------------------------------------------------------------------------------------------------------------------------------------------------------------------------------------------- Assessment and Plan  Celiac disease/sprue Management per GI.  Avoidance of gluten-containing products.  Stable at this time.  Hypothyroid Update thyroid function test.  Anxiety and depression Tolerating bupropion well at current strength.  Would like to try increasing the dose.  Given her problems with intolerance to medications in the past we will change this to 150 mg twice daily for total of 300 mg daily.   Meds ordered this encounter  Medications   buPROPion (WELLBUTRIN XL) 150 MG 24 hr tablet    Sig: Take 1 tablet (150 mg total) by mouth in the morning and at bedtime.    Dispense:  180 tablet    Refill:  2    Return in about 6 months (around 09/09/2023) for Mood.    This visit occurred during the SARS-CoV-2 public health emergency.  Safety protocols were in place, including screening questions prior to the visit, additional usage of staff PPE, and extensive cleaning of exam room while observing appropriate contact time as indicated for disinfecting solutions.

## 2023-03-10 NOTE — Assessment & Plan Note (Signed)
Update thyroid function test. °

## 2023-03-10 NOTE — Assessment & Plan Note (Signed)
Tolerating bupropion well at current strength.  Would like to try increasing the dose.  Given her problems with intolerance to medications in the past we will change this to 150 mg twice daily for total of 300 mg daily.

## 2023-03-23 ENCOUNTER — Telehealth: Payer: Managed Care, Other (non HMO) | Admitting: Physician Assistant

## 2023-03-23 DIAGNOSIS — N390 Urinary tract infection, site not specified: Secondary | ICD-10-CM

## 2023-03-23 NOTE — Progress Notes (Signed)
Because of recent UTI (in the past month) treated via e-visit, I feel your condition warrants further evaluation and I recommend that you be seen in a face to face visit as you need a urine culture to determine best antibiotics for full resolution of symptoms.    NOTE: There will be NO CHARGE for this eVisit   If you are having a true medical emergency please call 911.      For an urgent face to face visit, [place] has eight urgent care centers for your convenience:   NEW!! [facility] Health Urgent Care Center at [facility] Get Driving Directions [phone] [address] [place], [address]    [facility] Health Urgent Care Center at [facility] Get Driving Directions [phone] [address] [place], [place] [address]   [facility] Health Urgent Care Center [facility]) Get Driving Directions [phone] [address] [place], [place] [address]  [facility] Health Urgent Care Center [facility]) Get Driving Directions [phone] [address] [place],  [place]  [address]  [facility] Urgent Care Center [facility] - at [employer]  [phone] [place],  [place] [address]   [facility] Health Urgent Care at [facility] Get Driving Directions [phone] [address] [place], [place] [address]   [facility] Health Urgent Care at [facility] Get Driving Directions  [phone] [address] [place], [place] [address]   [facility] Health Urgent Care at [facility] Directions [phone] [address] [place], [place] [address]  Your MyChart E-visit questionnaire answers were reviewed by a board certified advanced clinical practitioner to complete your personal care plan based on your specific symptoms.  Thank you for using e-Visits.

## 2023-04-08 ENCOUNTER — Other Ambulatory Visit: Payer: Self-pay | Admitting: Family Medicine

## 2023-04-08 DIAGNOSIS — E039 Hypothyroidism, unspecified: Secondary | ICD-10-CM

## 2023-04-22 ENCOUNTER — Encounter: Payer: Self-pay | Admitting: Family Medicine

## 2023-04-24 ENCOUNTER — Other Ambulatory Visit: Payer: Self-pay | Admitting: Family Medicine

## 2023-04-24 MED ORDER — BUPROPION HCL ER (XL) 300 MG PO TB24: 300.0000 mg | ORAL_TABLET | Freq: Every day | ORAL | 1 refills | Status: DC

## 2023-05-02 MED ORDER — BUPROPION HCL ER (XL) 150 MG PO TB24: 300.0000 mg | ORAL_TABLET | Freq: Every day | ORAL | 1 refills | Status: DC

## 2023-05-02 NOTE — Addendum Note (Signed)
Addended by: Mammie Lorenzo on: 05/02/2023 05:11 PM   Modules accepted: Orders

## 2023-06-01 ENCOUNTER — Ambulatory Visit: Payer: Managed Care, Other (non HMO) | Admitting: Family Medicine

## 2023-06-23 ENCOUNTER — Telehealth: Payer: Self-pay | Admitting: Family Medicine

## 2023-06-23 NOTE — Telephone Encounter (Signed)
Initiated Prior authorization ZOX:WRUEAVWUJ (WELLBUTRIN XL) 150 MG Quantity limit exception quant approval for 180 tab for 90 days (please note 60 for 30 is approved at a 30 day dispense only wihout pa) Via: Marchia Bond directly [id number] FAOZ:[id number] Status: Pending as of 712/24 Reason: Notified Pt via: Mychart

## 2023-06-23 NOTE — Telephone Encounter (Signed)
Patient called. PA is required for Wellbutrin.  Insurance will only approve 2-150mg  and not 300 mg.(pill is too big)

## 2023-07-26 ENCOUNTER — Telehealth: Payer: Self-pay | Admitting: Family Medicine

## 2023-07-26 ENCOUNTER — Telehealth: Payer: Self-pay

## 2023-07-26 NOTE — Telephone Encounter (Signed)
Initiated Prior authorization ZOX:WRUEAVWUJ HCl ER (SR) 150MG  er tablets Via: Covermymeds Case/Key:[id number] Status: approved as of 07/26/23 Reason:Drug is covered by current benefit plan. No further PA activity needed Notified Pt via: Mychart

## 2023-07-26 NOTE — Telephone Encounter (Signed)
Patient called in wanting an update on PA for buPROPion (WELLBUTRIN XL) 150 MG. Please Advise

## 2023-08-07 ENCOUNTER — Encounter: Payer: Self-pay | Admitting: Family Medicine

## 2023-08-10 MED ORDER — BUPROPION HCL ER (XL) 150 MG PO TB24: 450.0000 mg | ORAL_TABLET | Freq: Every day | ORAL | 1 refills | Status: DC

## 2023-08-10 NOTE — Addendum Note (Signed)
Addended by: Everrett Coombe E on: 08/10/2023 08:00 AM   Modules accepted: Orders

## 2023-09-28 ENCOUNTER — Ambulatory Visit: Payer: Managed Care, Other (non HMO) | Admitting: Medical-Surgical

## 2023-09-28 ENCOUNTER — Encounter: Payer: Self-pay | Admitting: Medical-Surgical

## 2023-09-28 VITALS — BP 117/73 | HR 116 | Resp 20 | Ht 66.0 in | Wt 107.9 lb

## 2023-09-28 DIAGNOSIS — N898 Other specified noninflammatory disorders of vagina: Secondary | ICD-10-CM

## 2023-09-28 LAB — POCT URINALYSIS DIP (CLINITEK)
Bilirubin, UA: NEGATIVE
Blood, UA: NEGATIVE
Glucose, UA: NEGATIVE mg/dL
Ketones, POC UA: NEGATIVE mg/dL
Leukocytes, UA: NEGATIVE
Nitrite, UA: NEGATIVE
POC PROTEIN,UA: NEGATIVE
Spec Grav, UA: >=1.03 — AB (ref 1.010–1.025)
Urobilinogen, UA: 0.2 U/dL
pH, UA: 5.5 (ref 5.0–8.0)

## 2023-09-28 NOTE — Progress Notes (Signed)
        Established patient visit  History, exam, impression, and plan:  1. Vaginal discharge Very pleasant 42 year old female presenting today for evaluation of vaginal discharge.  Notes a long history of having excessive vaginal discharge as well as recurring BV in the past.  She has also had recurrent UTIs.  Today she reports that the excessive vaginal discharge is now accompanied by a foul odor.  Her discharge is described as creamy in color and texture.  She has been using boric acid to help with this but uses 1 suppository at bedtime as needed.  Usually averages about twice a week.  She is currently sexually active with 1 female partner and he has noted the increase in discharge.  Notes this is very embarrassing and is interested in definitive improvement.  Historically, has not tolerated metronidazole orally or vaginally.  Has not tried clindamycin recently.  POCT urinalysis negative however specific gravity is elevated indicating she should drink more fluids.  Patient is aware of this.  Sending wet prep for evaluation of BV and yeast.  Discussed recommendations for treatment and if necessary, she would like to try the clindamycin vaginal course.  Also discussed using boric acid to treat BV versus as suppressive therapy.  Reviewed recommendations for vaginal hygiene and avoidance of soap and bubble baths in the area.  If wet prep is positive and treatment is helpful but does not fully curve the symptoms, we may need to further evaluate with a sure swab which will speciate the offending organism and help direct therapy. - POCT URINALYSIS DIP (CLINITEK) - WET PREP FOR TRICH, YEAST, CLUE  Procedures performed this visit: None.  Return if symptoms worsen or fail to improve.  __________________________________ Thayer Ohm, DNP, APRN, FNP-BC Primary Care and Sports Medicine [facility]

## 2023-09-29 ENCOUNTER — Telehealth: Payer: Self-pay | Admitting: Medical-Surgical

## 2023-09-29 LAB — WET PREP FOR TRICH, YEAST, CLUE
Clue Cell Exam: POSITIVE — AB
Trichomonas Exam: NEGATIVE
Yeast Exam: NEGATIVE

## 2023-09-29 MED ORDER — CLINDAMYCIN PHOSPHATE 2 % VA CREA: 1.0000 | TOPICAL_CREAM | Freq: Every day | VAGINAL | 0 refills | Status: AC

## 2023-09-29 NOTE — Addendum Note (Signed)
Addended byChristen Butter on: 09/29/2023 04:37 PM   Modules accepted: Orders

## 2023-09-29 NOTE — Telephone Encounter (Signed)
Patient called she is asking about her lab results wanted to know if they're in yet so she could start medication before the weekend [phone]

## 2023-10-06 ENCOUNTER — Other Ambulatory Visit: Payer: Self-pay | Admitting: Medical Genetics

## 2023-10-06 DIAGNOSIS — Z006 Encounter for examination for normal comparison and control in clinical research program: Secondary | ICD-10-CM

## 2023-10-20 ENCOUNTER — Encounter: Payer: Self-pay | Admitting: Family Medicine

## 2023-10-20 DIAGNOSIS — K900 Celiac disease: Secondary | ICD-10-CM

## 2023-10-26 LAB — CBC WITH DIFFERENTIAL/PLATELET
Basophils Absolute: 0.1 10*3/uL (ref 0.0–0.2)
Basos: 1 %
EOS (ABSOLUTE): 0.3 10*3/uL (ref 0.0–0.4)
Eos: 3 %
Hematocrit: 40.2 % (ref 34.0–46.6)
Hemoglobin: 12.8 g/dL (ref 11.1–15.9)
Immature Grans (Abs): 0 10*3/uL (ref 0.0–0.1)
Immature Granulocytes: 0 %
Lymphocytes Absolute: 1.5 10*3/uL (ref 0.7–3.1)
Lymphs: 15 %
MCH: 28.4 pg (ref 26.6–33.0)
MCHC: 31.8 g/dL (ref 31.5–35.7)
MCV: 89 fL (ref 79–97)
Monocytes Absolute: 0.8 10*3/uL (ref 0.1–0.9)
Monocytes: 8 %
Neutrophils Absolute: 7.5 10*3/uL — ABNORMAL HIGH (ref 1.4–7.0)
Neutrophils: 73 %
Platelets: 220 10*3/uL (ref 150–450)
RBC: 4.5 x10E6/uL (ref 3.77–5.28)
RDW: 12 % (ref 11.7–15.4)
WBC: 10.1 10*3/uL (ref 3.4–10.8)

## 2023-10-26 LAB — IRON,TIBC AND FERRITIN PANEL
Ferritin: 14 ng/mL — ABNORMAL LOW (ref 15–150)
Iron Saturation: 7 % — CL (ref 15–55)
Iron: 25 ug/dL — ABNORMAL LOW (ref 27–159)
Total Iron Binding Capacity: 335 ug/dL (ref 250–450)
UIBC: 310 ug/dL (ref 131–425)

## 2023-10-26 LAB — CMP14+EGFR
ALT: 12 [IU]/L (ref 0–32)
AST: 16 [IU]/L (ref 0–40)
Albumin: 4.1 g/dL (ref 3.9–4.9)
Alkaline Phosphatase: 41 [IU]/L — ABNORMAL LOW (ref 44–121)
BUN/Creatinine Ratio: 15 (ref 9–23)
BUN: 12 mg/dL (ref 6–24)
Bilirubin Total: <0.2 mg/dL (ref 0.0–1.2)
CO2: 22 mmol/L (ref 20–29)
Calcium: 9.3 mg/dL (ref 8.7–10.2)
Chloride: 103 mmol/L (ref 96–106)
Creatinine, Ser: 0.79 mg/dL (ref 0.57–1.00)
Globulin, Total: 1.9 g/dL (ref 1.5–4.5)
Glucose: 76 mg/dL (ref 70–99)
Potassium: 4.4 mmol/L (ref 3.5–5.2)
Sodium: 140 mmol/L (ref 134–144)
Total Protein: 6 g/dL (ref 6.0–8.5)
eGFR: 97 mL/min/{1.73_m2} (ref >59)

## 2023-10-26 LAB — VITAMIN D 25 HYDROXY (VIT D DEFICIENCY, FRACTURES): Vit D, 25-Hydroxy: 31.4 ng/mL (ref 30.0–100.0)

## 2023-10-26 LAB — VITAMIN B12: Vitamin B-12: 543 pg/mL (ref 232–1245)

## 2023-10-31 ENCOUNTER — Other Ambulatory Visit (HOSPITAL_COMMUNITY)
Admission: RE | Admit: 2023-10-31 | Discharge: 2023-10-31 | Disposition: A | Discharge status: Home / Self Care | Payer: Managed Care, Other (non HMO) | Source: Ambulatory Visit | Attending: Medical Genetics | Admitting: Medical Genetics

## 2023-10-31 DIAGNOSIS — Z006 Encounter for examination for normal comparison and control in clinical research program: Secondary | ICD-10-CM | POA: Insufficient documentation

## 2023-11-07 ENCOUNTER — Encounter: Payer: Self-pay | Admitting: Family Medicine

## 2023-11-07 ENCOUNTER — Ambulatory Visit: Payer: Managed Care, Other (non HMO) | Admitting: Family Medicine

## 2023-11-07 VITALS — BP 112/68 | HR 102 | Ht 66.0 in | Wt 109.0 lb

## 2023-11-07 DIAGNOSIS — M2550 Pain in unspecified joint: Secondary | ICD-10-CM | POA: Diagnosis not present

## 2023-11-07 MED ORDER — PREDNISONE 10 MG (21) PO TBPK: ORAL_TABLET | ORAL | 0 refills | Status: DC

## 2023-11-07 NOTE — Progress Notes (Signed)
Emily [name] - 42 y.o. female MRN [id number]  Date of birth: 1981-08-24  Subjective Chief Complaint  Patient presents with   Generalized Body Aches    HPI Emily [name] is a 42 y.o. female here today with complaint of body and joint aches.  Her symptoms started a few days ago.  She has had similar symptoms in the past just not this severe.  Pain in bilateral wrists, shoulders and knees.  So far she has not tried anything so far.  Currently being treated for UTI with macrobid.  Denies fever or chills.  ROS:  A comprehensive ROS was completed and negative except as noted per HPI  Allergies  Allergen Reactions   Amoxicillin-Pot Clavulanate Anaphylaxis, Hives and Itching   Promethazine Swelling   Rifaximin Anaphylaxis, Itching and Rash        Benzonatate Other (See Comments)    Lump on throat   Clavulanic Acid Other (See Comments)    Flushing   Gluten Meal Diarrhea, Nausea And Vomiting and Other (See Comments)    GI Upset   Levothyroxine Diarrhea and Other (See Comments)    Only has GI reaction to synthroid brand   Rifaximin Itching    Itching of throat   Ceftriaxone Other (See Comments)    "Made me collapse"   Epinephrine Other (See Comments)    SVT    Tramadol    Dextromethorphan Other (See Comments)   Levothyroxine Sodium Diarrhea    Only has GI reaction to synthroid brand    Past Medical History:  Diagnosis Date   Anemia    history   Bartholin cyst 12/27/2011   Celiac disease    Collagenous colitis    Delivery of twins, both live 12/02/2012   Dichorionic diamniotic twin pregnancy 12/01/2012   Headache(784.0)    complex migraines with cardiac and neuro symptoms during pregnancy   Hypothyroidism    Infertility, female    SVD (spontaneous vaginal delivery) 12/02/2012   Thyroid disease     Past Surgical History:  Procedure Laterality Date   BARTHOLIN CYST MARSUPIALIZATION  12/28/2011   Procedure: BARTHOLIN CYST MARSUPIALIZATION;  Surgeon: Sherron Monday, MD;   Location: [facility];  Service: Gynecology;  Laterality: N/A;   COLONOSCOPY     RHINOPLASTY     TONSILLECTOMY     UPPER GASTROINTESTINAL ENDOSCOPY     WISDOM TOOTH EXTRACTION      Social History   Socioeconomic History   Marital status: Legally Separated    Spouse name: Not on file   Number of children: Not on file   Years of education: Not on file   Highest education level: Not on file  Occupational History   Not on file  Tobacco Use   Smoking status: Never   Smokeless tobacco: Never  Vaping Use   Vaping status: Never Used  Substance and Sexual Activity   Alcohol use: Yes    Alcohol/week: 5.0 standard drinks of alcohol    Types: 5 Standard drinks or equivalent per week   Drug use: No   Sexual activity: Yes  Other Topics Concern   Not on file  Social History Narrative   Not on file   Social Determinants of Health   Financial Resource Strain: Low Risk  (05/31/2023)   Received from [facility]   Overall Financial Resource Strain (CARDIA)    Difficulty of Paying Living Expenses: Not hard at all  Food Insecurity: No Food Insecurity (05/31/2023)   Received from [facility]   Hunger Vital  Sign    Worried About Programme researcher, broadcasting/film/video in the Last Year: Never true    Ran Out of Food in the Last Year: Never true  Transportation Needs: No Transportation Needs (05/31/2023)   Received from [facility] - Transportation    Lack of Transportation (Medical): No    Lack of Transportation (Non-Medical): No  Physical Activity: Not on file  Stress: Not on file  Social Connections: Unknown (05/31/2023)   Received from [facility]   Social Network    Social Network: Not on file    Family History  Problem Relation Age of Onset   Healthy Mother    Healthy Father    Heart attack Maternal Grandmother    Rashes / Skin problems Paternal Grandmother    Clotting disorder Paternal Uncle     Health Maintenance  Topic Date Due   Hepatitis C Screening  Never done   COVID-19  Vaccine (3 - Pfizer risk series) 04/21/2020   INFLUENZA VACCINE  03/11/2024 (Originally 07/13/2023)   Cervical Cancer Screening (HPV/Pap Cotest)  07/07/2025   DTaP/Tdap/Td (2 - Td or Tdap) 10/27/2025   HIV Screening  Completed   HPV VACCINES  Aged Out     ----------------------------------------------------------------------------------------------------------------------------------------------------------------------------------------------------------------- Physical Exam BP 112/68 (BP Location: Left Arm, Patient Position: Sitting, Cuff Size: Small)   Pulse (!) 102   Ht 5\' 6"  (1.676 m)   Wt 109 lb (49.4 kg)   SpO2 100%   BMI 17.59 kg/m   Physical Exam Constitutional:      Appearance: Normal appearance.  HENT:     Head: Normocephalic and atraumatic.  Eyes:     General: No scleral icterus. Cardiovascular:     Rate and Rhythm: Normal rate and regular rhythm.  Pulmonary:     Effort: Pulmonary effort is normal.     Breath sounds: Normal breath sounds.  Musculoskeletal:     Comments: Joint tenderness but no swelling/effusion  Neurological:     General: No focal deficit present.     Mental Status: She is alert.  Psychiatric:        Mood and Affect: Mood normal.        Behavior: Behavior normal.     ------------------------------------------------------------------------------------------------------------------------------------------------------------------------------------------------------------------- Assessment and Plan  Arthralgia Adding steroid taper.  Orders Placed This Encounter  Procedures   Sed Rate (ESR)   C-reactive protein   ANA,IFA RA Diag Pnl w/rflx Tit/Patn   CK (Creatine Kinase)   Uric acid  Discussed possibility of FM as well.  We can consider trial of lyrica to help with pain if auto-immune work up is normal.     Meds ordered this encounter  Medications   predniSONE (STERAPRED UNI-PAK 21 TAB) 10 MG (21) TBPK tablet    Sig: Taper as directed  on packaging    Dispense:  21 tablet    Refill:  0    No follow-ups on file.    This visit occurred during the SARS-CoV-2 public health emergency.  Safety protocols were in place, including screening questions prior to the visit, additional usage of staff PPE, and extensive cleaning of exam room while observing appropriate contact time as indicated for disinfecting solutions.

## 2023-11-07 NOTE — Assessment & Plan Note (Addendum)
Adding steroid taper.  Orders Placed This Encounter  Procedures   Sed Rate (ESR)   C-reactive protein   ANA,IFA RA Diag Pnl w/rflx Tit/Patn   CK (Creatine Kinase)   Uric acid  Discussed possibility of FM as well.  We can consider trial of lyrica to help with pain if auto-immune work up is normal.

## 2023-11-08 ENCOUNTER — Encounter: Payer: Self-pay | Admitting: Family Medicine

## 2023-11-08 DIAGNOSIS — H109 Unspecified conjunctivitis: Secondary | ICD-10-CM

## 2023-11-10 LAB — URIC ACID: Uric Acid: 3.3 mg/dL (ref 2.6–6.2)

## 2023-11-10 LAB — CK: Total CK: 29 U/L — ABNORMAL LOW (ref 32–182)

## 2023-11-10 LAB — ANA,IFA RA DIAG PNL W/RFLX TIT/PATN
ANA Titer 1: NEGATIVE
Cyclic Citrullin Peptide Ab: 5 U (ref 0–19)
Rheumatoid fact SerPl-aCnc: <10 [IU]/mL (ref <14.0)

## 2023-11-10 LAB — SEDIMENTATION RATE: Sed Rate: 2 mm/h (ref 0–32)

## 2023-11-10 LAB — C-REACTIVE PROTEIN: CRP: 3 mg/L (ref 0–10)

## 2023-11-14 LAB — GENECONNECT MOLECULAR SCREEN: Genetic Analysis Overall Interpretation: NEGATIVE

## 2023-11-27 MED ORDER — TOBRAMYCIN 0.3 % OP SOLN: 1.0000 [drp] | OPHTHALMIC | 0 refills | Status: AC

## 2024-02-22 ENCOUNTER — Other Ambulatory Visit: Payer: Self-pay | Admitting: Family Medicine

## 2024-02-22 DIAGNOSIS — Z1231 Encounter for screening mammogram for malignant neoplasm of breast: Secondary | ICD-10-CM

## 2024-03-07 ENCOUNTER — Ambulatory Visit

## 2024-03-07 DIAGNOSIS — Z1231 Encounter for screening mammogram for malignant neoplasm of breast: Secondary | ICD-10-CM | POA: Diagnosis not present

## 2024-03-19 ENCOUNTER — Other Ambulatory Visit (HOSPITAL_COMMUNITY): Payer: Self-pay

## 2024-03-19 ENCOUNTER — Telehealth: Payer: Self-pay

## 2024-03-19 NOTE — Telephone Encounter (Signed)
 Call transferred from E2C2 agent - Rosann Auerbach is requesting a verbal prior authorization for bupropion xl. Per Rosann Auerbach - the medication requires an auth for an override on the quantity limit. PA process completed. Case #[id number]. Approved from 02/18/24 - 03/19/25.

## 2024-03-19 NOTE — Telephone Encounter (Signed)
 Copied from CRM [id number]. Topic: Clinical - Prescription Issue >> Mar 19, 2024  2:17 PM Dennison Nancy wrote: Reason for CRM:  Runell Gess with  [facility] calling regarding need a prior authorization for quality limit for Bupropion XL 150 mg ,   Request if can do verbally , [employer] [phone]

## 2024-03-19 NOTE — Telephone Encounter (Signed)
 Pharmacy Patient Advocate Encounter  Received notification from [employer] that Prior Authorization for Wellbutrin XL 150 has been APPROVED from 03/19/24 to 03/19/25. Ran test claim, Copay is $15.00 for a 90 day supply. This test claim was processed through [facility]- copay amounts may vary at other pharmacies due to pharmacy/plan contracts, or as the patient moves through the different stages of their insurance plan.

## 2024-03-26 ENCOUNTER — Other Ambulatory Visit: Payer: Self-pay

## 2024-03-26 ENCOUNTER — Other Ambulatory Visit: Payer: Self-pay | Admitting: Family Medicine

## 2024-03-26 DIAGNOSIS — F32A Depression, unspecified: Secondary | ICD-10-CM

## 2024-03-26 MED ORDER — BUPROPION HCL ER (XL) 150 MG PO TB24: 450.0000 mg | ORAL_TABLET | Freq: Every day | ORAL | 5 refills | Status: DC

## 2024-03-26 MED ORDER — BUPROPION HCL ER (XL) 150 MG PO TB24: 450.0000 mg | ORAL_TABLET | Freq: Every day | ORAL | 11 refills | Status: DC

## 2024-04-05 ENCOUNTER — Other Ambulatory Visit: Payer: Self-pay | Admitting: Family Medicine

## 2024-04-05 DIAGNOSIS — K900 Celiac disease: Secondary | ICD-10-CM

## 2024-04-05 DIAGNOSIS — L659 Nonscarring hair loss, unspecified: Secondary | ICD-10-CM

## 2024-04-15 ENCOUNTER — Encounter: Payer: Self-pay | Admitting: Family Medicine

## 2024-04-24 LAB — B12 AND FOLATE PANEL
Folate: 6.3 ng/mL (ref >3.0)
Vitamin B-12: 488 pg/mL (ref 232–1245)

## 2024-04-24 LAB — VITAMIN E
Vitamin E (Alpha Tocopherol): 11.3 mg/L (ref 7.0–25.1)
Vitamin E(Gamma Tocopherol): 1.2 mg/L (ref 0.5–5.5)

## 2024-04-24 LAB — MAGNESIUM: Magnesium: 2 mg/dL (ref 1.6–2.3)

## 2024-04-24 LAB — VITAMIN K1, SERUM: VITAMIN K1: 0.37 ng/mL (ref 0.10–2.20)

## 2024-04-24 LAB — VITAMIN B6: Vitamin B6: 15.2 ug/L (ref 3.4–65.2)

## 2024-04-24 LAB — SELENIUM SERUM: Selenium, S/P: 137 ug/L (ref 93–198)

## 2024-04-24 LAB — VITAMIN B1: Thiamine: 117.9 nmol/L (ref 66.5–200.0)

## 2024-04-24 LAB — ZINC: Zinc: 81 ug/dL (ref 44–115)

## 2024-04-24 LAB — VITAMIN D 25 HYDROXY (VIT D DEFICIENCY, FRACTURES): Vit D, 25-Hydroxy: 38.7 ng/mL (ref 30.0–100.0)

## 2024-04-24 LAB — VITAMIN A: Vitamin A: 38.2 ug/dL (ref 20.1–62.0)

## 2024-04-24 LAB — COPPER, SERUM: Copper: 99 ug/dL (ref 80–158)

## 2024-04-25 ENCOUNTER — Other Ambulatory Visit: Payer: Self-pay | Admitting: Family Medicine

## 2024-04-25 DIAGNOSIS — E039 Hypothyroidism, unspecified: Secondary | ICD-10-CM

## 2024-05-27 ENCOUNTER — Ambulatory Visit: Admitting: Family Medicine

## 2024-05-27 ENCOUNTER — Telehealth (INDEPENDENT_AMBULATORY_CARE_PROVIDER_SITE_OTHER): Admitting: Family Medicine

## 2024-05-27 ENCOUNTER — Encounter: Payer: Self-pay | Admitting: Family Medicine

## 2024-05-27 VITALS — Ht 66.0 in | Wt 109.0 lb

## 2024-05-27 DIAGNOSIS — F419 Anxiety disorder, unspecified: Secondary | ICD-10-CM

## 2024-05-27 DIAGNOSIS — K909 Intestinal malabsorption, unspecified: Secondary | ICD-10-CM | POA: Diagnosis not present

## 2024-05-27 DIAGNOSIS — E349 Endocrine disorder, unspecified: Secondary | ICD-10-CM | POA: Diagnosis not present

## 2024-05-27 DIAGNOSIS — F32A Depression, unspecified: Secondary | ICD-10-CM

## 2024-05-27 DIAGNOSIS — R5383 Other fatigue: Secondary | ICD-10-CM

## 2024-05-27 NOTE — Progress Notes (Signed)
Changing medication

## 2024-05-27 NOTE — Progress Notes (Signed)
 Emily [name] - 43 y.o. female MRN [id number]  Date of birth: 25-Oct-1981   All issues noted in this document were discussed and addressed.  No physical exam was performed (except for noted visual exam findings with Video Visits).  I discussed the limitations of evaluation and management by telemedicine and the availability of in person appointments. The patient expressed understanding and agreed to proceed.  I connected withNAME@ on 05/27/24 at 10:30 AM EDT by a video enabled telemedicine application and verified that I am speaking with the correct person using two identifiers.  Present at visit: Adela Holter, DO G[place] Laud   Patient Location: Home [address] [place] [address]   Provider location:   [facility]  Chief Complaint  Patient presents with   Medication Refill    HPI  Emily [name] is a 43 y.o. female who presents via audio/video conferencing for a telehealth visit today.  She has weaned from bupropion  due to concern about this contributing to hair loss.  She has had some increased depression and anxiety symptoms since stopping this.  She has tried multiple medications in the past without good control of her symptoms and/or intolerance.  She is concerned about possible hormonal imbalance contributing to mood as well as hair loss.  She has had evaluation for malabsorption of vitamins and trace minerals.   ROS:  A comprehensive ROS was completed and negative except as noted per HPI  Past Medical History:  Diagnosis Date   Anemia    history   Bartholin cyst 12/27/2011   Celiac disease    Collagenous colitis    Delivery of twins, both live 12/02/2012   Dichorionic diamniotic twin pregnancy 12/01/2012   Headache(784.0)    complex migraines with cardiac and neuro symptoms during pregnancy   Hypothyroidism    Infertility, female    SVD (spontaneous vaginal delivery) 12/02/2012   Thyroid  disease     Past Surgical History:  Procedure Laterality Date    BARTHOLIN CYST MARSUPIALIZATION  12/28/2011   Procedure: BARTHOLIN CYST MARSUPIALIZATION;  Surgeon: Chrystal Crape, MD;  Location: [facility];  Service: Gynecology;  Laterality: N/A;   COLONOSCOPY     RHINOPLASTY     TONSILLECTOMY     UPPER GASTROINTESTINAL ENDOSCOPY     WISDOM TOOTH EXTRACTION      Family History  Problem Relation Age of Onset   Healthy Mother    Healthy Father    Heart attack Maternal Grandmother    Rashes / Skin problems Paternal Grandmother    Clotting disorder Paternal Uncle     Social History   Socioeconomic History   Marital status: Legally Separated    Spouse name: Not on file   Number of children: Not on file   Years of education: Not on file   Highest education level: Not on file  Occupational History   Not on file  Tobacco Use   Smoking status: Never   Smokeless tobacco: Never  Vaping Use   Vaping status: Never Used  Substance and Sexual Activity   Alcohol use: Yes    Alcohol/week: 5.0 standard drinks of alcohol    Types: 5 Standard drinks or equivalent per week   Drug use: No   Sexual activity: Yes  Other Topics Concern   Not on file  Social History Narrative   Not on file   Social Drivers of Health   Financial Resource Strain: Low Risk  (05/31/2023)   Received from [facility]   Overall Financial Resource Strain (CARDIA)  Difficulty of Paying Living Expenses: Not hard at all  Food Insecurity: No Food Insecurity (05/31/2023)   Received from [facility]   Hunger Vital Sign    Within the past 12 months, you worried that your food would run out before you got the money to buy more.: Never true    Within the past 12 months, the food you bought just didn't last and you didn't have money to get more.: Never true  Transportation Needs: No Transportation Needs (05/31/2023)   Received from [facility]   PRAPARE - Transportation    Lack of Transportation (Medical): No    Lack of Transportation (Non-Medical): No  Physical Activity: Not on  file  Stress: Not on file  Social Connections: Unknown (05/31/2023)   Received from [facility]   Social Network    Social Network: Not on file  Intimate Partner Violence: Unknown (05/31/2023)   Received from [facility]   HITS    Physically Hurt: Not on file    Insult or Talk Down To: Not on file    Threaten Physical Harm: Not on file    Scream or Curse: Not on file     Current Outpatient Medications:    ALPRAZolam  (XANAX ) 0.25 MG tablet, Take 1 tablet (0.25 mg total) by mouth daily as needed for anxiety (use sparingly to avoid dependence)., Disp: 30 tablet, Rfl: 0   Budesonide  ER 9 MG TB24, Take by mouth., Disp: , Rfl:    buPROPion  (WELLBUTRIN  XL) 150 MG 24 hr tablet, Take 3 tablets (450 mg total) by mouth daily., Disp: 90 tablet, Rfl: 5   diphenoxylate-atropine (LOMOTIL) 2.5-0.025 MG tablet, Take daily as needed by mouth., Disp: , Rfl:    levothyroxine  (UNITHROID ) 125 MCG tablet, TAKE 1 TABLET BY MOUTH EVERY DAY BEFORE BREAKFAST, Disp: 90 tablet, Rfl: 3   meclizine  (ANTIVERT ) 25 MG tablet, Take 1 tablet (25 mg total) by mouth 3 (three) times daily as needed for dizziness., Disp: 30 tablet, Rfl: 0   nitrofurantoin , macrocrystal-monohydrate, (MACROBID ) 100 MG capsule, Take 100 mg by mouth 2 (two) times daily., Disp: , Rfl:    ondansetron  (ZOFRAN ) 4 MG tablet, Take 4 mg by mouth as needed., Disp: , Rfl:    tobramycin  (TOBREX ) 0.3 % ophthalmic solution, Place 1 drop into both eyes every 4 (four) hours. X 5 days, Disp: 5 mL, Rfl: 0  EXAM:  VITALS per patient if applicable: Ht 5' 6 (1.676 m)   Wt 109 lb (49.4 kg)   BMI 17.59 kg/m   GENERAL: alert, oriented, appears well and in no acute distress  HEENT: atraumatic, conjunttiva clear, no obvious abnormalities on inspection of external nose and ears  NECK: normal movements of the head and neck  LUNGS: on inspection no signs of respiratory distress, breathing rate appears normal, no obvious gross SOB, gasping or wheezing  CV: no  obvious cyanosis  MS: moves all visible extremities without noticeable abnormality  PSYCH/NEURO: pleasant and cooperative, no obvious depression or anxiety, speech and thought processing grossly intact  ASSESSMENT AND PLAN:  Discussed the following assessment and plan:  Anxiety and depression She has tried multiple medications for management of depression and anxiety with varying response and tolerance.  Most recently discontinued bupropion  due to concern for hair loss.  Hair loss has improved slightly but has not resolved.  She does have mild option related to other chronic diseases.  Thinks that possible hormone imbalance may be contributing to her mood as well as eliquis.  Checking additional  labs today related to perimenopause     I discussed the assessment and treatment plan with the patient. The patient was provided an opportunity to ask questions and all were answered. The patient agreed with the plan and demonstrated an understanding of the instructions.   The patient was advised to call back or seek an in-person evaluation if the symptoms worsen or if the condition fails to improve as anticipated.    Adela Holter, DO

## 2024-05-27 NOTE — Assessment & Plan Note (Addendum)
 She has tried multiple medications for management of depression and anxiety with varying response and tolerance.  Most recently discontinued bupropion  due to concern for hair loss.  Hair loss has improved slightly but has not resolved.  She does have mild option related to other chronic diseases.  Thinks that possible hormone imbalance may be contributing to her mood as well as eliquis.  Checking additional labs today related to perimenopause

## 2024-05-31 ENCOUNTER — Ambulatory Visit: Payer: Self-pay | Admitting: Family Medicine

## 2024-06-04 LAB — FSH/LH
FSH: 6.2 m[IU]/mL
LH: 8.8 m[IU]/mL

## 2024-06-04 LAB — CORTISOL: Cortisol: 6.3 ug/dL (ref 6.2–19.4)

## 2024-06-04 LAB — TESTOSTERONE: Testosterone: <3 ng/dL — ABNORMAL LOW (ref 4–50)

## 2024-06-04 LAB — PREGNENOLONE: Pregnenolone: 26 ng/dL

## 2024-06-04 LAB — DHEA-SULFATE: DHEA-SO4: 45.8 ug/dL — ABNORMAL LOW (ref 57.3–279.2)

## 2024-06-18 MED ORDER — FLUOXETINE HCL 20 MG PO CAPS
20.0000 mg | ORAL_CAPSULE | Freq: Every day | ORAL | 0 refills | Status: DC
Start: 2024-06-18 — End: 2024-09-13

## 2024-06-18 NOTE — Addendum Note (Signed)
 Addended by: Bridger Pizzi E on: 06/18/2024 12:48 PM   Modules accepted: Orders

## 2024-07-28 ENCOUNTER — Encounter: Payer: Self-pay | Admitting: Family Medicine

## 2024-07-28 DIAGNOSIS — L219 Seborrheic dermatitis, unspecified: Secondary | ICD-10-CM

## 2024-08-13 MED ORDER — CLOBETASOL PROPIONATE 0.05 % EX CREA: 1.0000 | TOPICAL_CREAM | Freq: Two times a day (BID) | CUTANEOUS | 0 refills | Status: AC

## 2024-08-13 NOTE — Telephone Encounter (Signed)
 Patient requesting rx rf of Clobetasol  Propionate cream (not ointment  Not showing in current med list  Last OV - telemedicine=06/16/225 Upcoming appt 08/22/2024

## 2024-08-20 NOTE — Progress Notes (Deleted)
 ANNUAL EXAM Patient name: Huldah Marin MRN [id number]  Date of birth: [birth year] Chief Complaint:   No chief complaint on file.  History of Present Illness:   Elner Seifert is a 43 y.o. G3P2003 with No LMP recorded. being seen today for a routine annual exam.  Current complaints: ***   The pregnancy intention screening data noted above was reviewed. Potential methods of contraception were discussed. The patient elected to proceed with No data recorded.   Last pap ***. Results were: {Pap findings:25134}. H/O abnormal pap: {yes/yes***/no:23866} Last mammogram: ***. Results were: {normal, abnormal, n/a:23837}. Family h/o breast cancer: {yes***/no:23838} Last colonoscopy: ***. Results were: {normal, abnormal, n/a:23837}. Family h/o colorectal cancer: {yes***/no:23838} HPV vaccine: ***     11/07/2023    1:25 PM 03/09/2023    4:10 PM 02/09/2022    4:16 PM 11/26/2021    8:22 AM 11/01/2019    8:28 AM  Depression screen PHQ 2/9  Decreased Interest 0 0 3 3 2   Down, Depressed, Hopeless 1 2 3 3 3   PHQ - 2 Score 1 2 6 6 5   Altered sleeping  0 1 0 2  Tired, decreased energy  0 2 2 2   Change in appetite  0 0 1 2  Feeling bad or failure about yourself   2 3 3 3   Trouble concentrating  1 2 2 1   Moving slowly or fidgety/restless  0 0 0 1  Suicidal thoughts  0 0 0 0  PHQ-9 Score  5 14 14 16   Difficult doing work/chores  Somewhat difficult Extremely dIfficult Extremely dIfficult Extremely dIfficult        03/09/2023    4:11 PM 02/09/2022    4:16 PM 11/26/2021    8:23 AM 11/01/2019    8:30 AM  GAD 7 : Generalized Anxiety Score  Nervous, Anxious, on Edge 1 3 2 3   Control/stop worrying 1 3 3 3   Worry too much - different things 1 3 3 3   Trouble relaxing 0 2 2 3   Restless 0 1 0 1  Easily annoyed or irritable 1 1 2 2   Afraid - awful might happen 1 3 3 3   Total GAD 7 Score 5 16 15 18   Anxiety Difficulty Somewhat difficult Very difficult Very difficult Extremely difficult     Review  of Systems:   Pertinent items are noted in HPI Denies any headaches, blurred vision, fatigue, shortness of breath, chest pain, abdominal pain, abnormal vaginal discharge/itching/odor/irritation, problems with periods, bowel movements, urination, or intercourse unless otherwise stated above. Pertinent History Reviewed:  Reviewed past medical,surgical, social and family history.  Reviewed problem list, medications and allergies. Physical Assessment:  There were no vitals filed for this visit.There is no height or weight on file to calculate BMI.        Physical Examination:   General appearance - well appearing, and in no distress  Mental status - alert, oriented to person, place, and time  Chest - respiratory effort normal  Heart - normal peripheral perfusion  Breasts - breasts appear normal, no suspicious masses, no skin or nipple changes or axillary nodes  Abdomen - soft, nontender, nondistended, no masses or organomegaly  Pelvic - VULVA: normal appearing vulva with no masses, tenderness or lesions  VAGINA: normal appearing vagina with normal color and discharge, no lesions  CERVIX: normal appearing cervix without discharge or lesions, no CMT  Thin prep pap is {Desc; done/not:10129} *** HR HPV cotesting  UTERUS: uterus is felt to be normal size, shape,  consistency and nontender   ADNEXA: No adnexal masses or tenderness noted.  Chaperone present for exam  No results found for this or any previous visit (from the past 24 hours).  Assessment & Plan:  1) Well-Woman Exam Mammogram: {Mammo f/u:25212::@ 40yo}, or sooner if problems Colonoscopy: {TCS f/u:25213::@ 41yo}, or sooner if problems Pap: Gardasil: GC/CT: HIV/HCV:  2) ***  Labs/procedures today: ***  No orders of the defined types were placed in this encounter.   Meds: No orders of the defined types were placed in this encounter.   Follow-up: No follow-ups on file.  Kieth JAYSON Carolin, MD 08/20/2024 8:35 PM

## 2024-08-22 ENCOUNTER — Encounter: Admitting: Obstetrics and Gynecology

## 2024-09-13 ENCOUNTER — Other Ambulatory Visit: Payer: Self-pay | Admitting: Family Medicine

## 2024-09-13 MED ORDER — FLUOXETINE HCL 20 MG PO CAPS: 20.0000 mg | ORAL_CAPSULE | Freq: Every day | ORAL | 1 refills | Status: AC

## 2024-09-13 NOTE — Telephone Encounter (Signed)
 Pls contact pt to schedule Prozac  med refill appt. Last seen 10/2023. Thanks

## 2024-11-04 ENCOUNTER — Encounter: Payer: Self-pay | Admitting: Urgent Care

## 2024-11-04 ENCOUNTER — Ambulatory Visit: Admitting: Urgent Care

## 2024-11-04 VITALS — BP 100/60 | HR 99 | Temp 98.7°F | Ht 66.0 in | Wt 108.0 lb

## 2024-11-04 DIAGNOSIS — J040 Acute laryngitis: Secondary | ICD-10-CM

## 2024-11-04 DIAGNOSIS — R591 Generalized enlarged lymph nodes: Secondary | ICD-10-CM

## 2024-11-04 MED ORDER — PREDNISONE 10 MG (21) PO TBPK: ORAL_TABLET | Freq: Every day | ORAL | 0 refills | Status: AC

## 2024-11-04 MED ORDER — AZITHROMYCIN 250 MG PO TABS: ORAL_TABLET | ORAL | 0 refills | Status: AC

## 2024-11-04 NOTE — Progress Notes (Signed)
 Established Patient Office Visit  Subjective:  Patient ID: Emily [name], female    DOB: [birth year]  Age: 43 y.o. MRN: [id number]  Chief Complaint  Patient presents with   Cough    X 1 month    HPI  Discussed the use of AI scribe software for clinical note transcription with the patient, who gave verbal consent to proceed.  History of Present Illness   Emily [name] is a 43 year old female with celiac disease and Hashimoto's thyroiditis who presents with persistent laryngeal inflammation and hoarseness.  She reports experiencing swelling and inflammation in her throat, which she describes as 'croup', occurring about one to two times yearly, typically following a cold contracted from her children. This episode began a month ago after a simple cold, leading to significant difficulty in speaking and a hoarse, whispering voice.  She has been self-treating with leftover prednisone  and budesonide , which have provided some relief in the past, but this time the symptoms persist despite treatment. She completed a course of prednisone  two weeks ago and started another round yesterday due to worsening symptoms, including difficulty sleeping.  No fever, muscle aches, congestion, sore throat, or cough are present, but she mentions a sensation of infection due to the persistent nature of her symptoms. She also experiences intermittent swelling of her lymph nodes, which are not currently severe.  Her job as an pensions consultant requires extensive speaking, which exacerbates her symptoms.       Patient Active Problem List   Diagnosis Date Noted   Arthralgia 11/07/2023   Supraventricular tachycardia 08/22/2022   Microscopic colitis 01/11/2022   Gastroparesis 05/17/2021   Diarrhea 05/17/2021   Cystitis 08/12/2020   Celiac disease/sprue 09/06/2019   GI problem 03/13/2019   Candidiasis of skin 11/28/2018   Chronic colitis 11/28/2018   History of miscarriage 11/28/2018   Nausea 11/28/2018    Pregnancy with isoimmunization 11/28/2018   Palpitations 11/16/2018   PVC (premature ventricular contraction) 11/16/2018   PAC (premature atrial contraction) 11/16/2018   Sinus tachycardia seen on cardiac monitor 04/06/2018   History of colonoscopy 03/31/2016   Cervical cancer screening 03/31/2016   History of celiac disease 03/31/2016   Hypothyroid 03/31/2016   Anxiety and depression 03/31/2016   History of anemia 03/31/2016   Tachycardia with heart rate 100-120 beats per minute 11/13/2012   Hypokalemia 11/13/2012   Complicated migraine 10/07/2012   Bartholin cyst 12/27/2011   Female infertility 10/24/2011   Past Medical History:  Diagnosis Date   Anemia    history   Bartholin cyst 12/27/2011   Celiac disease    Collagenous colitis    Delivery of twins, both live 12/02/2012   Dichorionic diamniotic twin pregnancy 12/01/2012   Headache(784.0)    complex migraines with cardiac and neuro symptoms during pregnancy   Hypothyroidism    Infertility, female    SVD (spontaneous vaginal delivery) 12/02/2012   Thyroid  disease    Past Surgical History:  Procedure Laterality Date   BARTHOLIN CYST MARSUPIALIZATION  12/28/2011   Procedure: BARTHOLIN CYST MARSUPIALIZATION;  Surgeon: Ezzie Marshall, MD;  Location: [facility];  Service: Gynecology;  Laterality: N/A;   COLONOSCOPY     RHINOPLASTY     TONSILLECTOMY     UPPER GASTROINTESTINAL ENDOSCOPY     WISDOM TOOTH EXTRACTION     Social History   Tobacco Use   Smoking status: Never   Smokeless tobacco: Never  Vaping Use   Vaping status: Never Used  Substance Use Topics   Alcohol use: Yes  Alcohol/week: 5.0 standard drinks of alcohol    Types: 5 Standard drinks or equivalent per week   Drug use: No      ROS: as noted in HPI  Objective:     BP 100/60   Pulse 99   Temp 98.7 F (37.1 C) (Oral)   Ht 5' 6 (1.676 m)   Wt 108 lb (49 kg)   SpO2 99%   BMI 17.43 kg/m  BP Readings from Last 3 Encounters:  11/04/24 100/60   11/07/23 112/68  09/28/23 117/73   Wt Readings from Last 3 Encounters:  11/04/24 108 lb (49 kg)  05/27/24 109 lb (49.4 kg)  11/07/23 109 lb (49.4 kg)      Physical Exam Vitals and nursing note reviewed. Exam conducted with a chaperone present.  Constitutional:      General: She is not in acute distress.    Appearance: Normal appearance. She is not ill-appearing, toxic-appearing or diaphoretic.  HENT:     Head: Normocephalic and atraumatic.     Right Ear: Tympanic membrane, ear canal and external ear normal. There is no impacted cerumen.     Left Ear: Tympanic membrane, ear canal and external ear normal. There is no impacted cerumen.     Nose: Congestion present. No rhinorrhea.     Mouth/Throat:     Mouth: Mucous membranes are moist.     Pharynx: Oropharynx is clear. No oropharyngeal exudate or posterior oropharyngeal erythema.     Comments: Voice is soft and raspy Eyes:     General: No scleral icterus.       Right eye: No discharge.        Left eye: No discharge.     Extraocular Movements: Extraocular movements intact.     Pupils: Pupils are equal, round, and reactive to light.  Cardiovascular:     Rate and Rhythm: Normal rate and regular rhythm.  Pulmonary:     Effort: Pulmonary effort is normal. No respiratory distress.     Breath sounds: Normal breath sounds. No stridor. No wheezing or rhonchi.  Musculoskeletal:     Cervical back: Normal range of motion and neck supple. No rigidity or tenderness.  Lymphadenopathy:     Cervical: Cervical adenopathy (B anterior cervical chain) present.  Skin:    General: Skin is warm and dry.     Coloration: Skin is not jaundiced.     Findings: No bruising, erythema or rash.  Neurological:     General: No focal deficit present.     Mental Status: She is alert and oriented to person, place, and time.     Gait: Gait normal.  Psychiatric:        Mood and Affect: Mood normal.        Behavior: Behavior normal.      No results found  for any visits on 11/04/24.  Last CBC Lab Results  Component Value Date   WBC 10.1 10/25/2023   HGB 12.8 10/25/2023   HCT 40.2 10/25/2023   MCV 89 10/25/2023   MCH 28.4 10/25/2023   RDW 12.0 10/25/2023   PLT 220 10/25/2023   Last metabolic panel Lab Results  Component Value Date   GLUCOSE 76 10/25/2023   NA 140 10/25/2023   K 4.4 10/25/2023   CL 103 10/25/2023   CO2 22 10/25/2023   BUN 12 10/25/2023   CREATININE 0.79 10/25/2023   EGFR 97 10/25/2023   CALCIUM 9.3 10/25/2023   PROT 6.0 10/25/2023   ALBUMIN 4.1 10/25/2023  LABGLOB 1.9 10/25/2023   BILITOT <0.2 10/25/2023   ALKPHOS 41 (L) 10/25/2023   AST 16 10/25/2023   ALT 12 10/25/2023   Last lipids Lab Results  Component Value Date   CHOL 164 07/07/2020   HDL 52 07/07/2020   LDLCALC 98 07/07/2020   TRIG 57 07/07/2020   CHOLHDL 3.2 07/07/2020   Last hemoglobin A1c No results found for: HGBA1C Last thyroid  functions Lab Results  Component Value Date   TSH 1.57 03/09/2023   T3TOTAL 84 08/16/2018   FREET4 1.5 03/09/2023   THYROIDAB >900 (H) 01/11/2022   Last vitamin D  Lab Results  Component Value Date   VD25OH 38.7 04/11/2024   Last vitamin B12 and Folate Lab Results  Component Value Date   VITAMINB12 488 04/11/2024   FOLATE 6.3 04/11/2024      The ASCVD Risk score (Arnett DK, et al., 2019) failed to calculate for the following reasons:   Cannot find a previous HDL lab   Cannot find a previous total cholesterol lab  Assessment & Plan:  Laryngitis -     Azithromycin ; Take 2 tablets on day 1, then 1 tablet daily on days 2 through 5  Dispense: 6 tablet; Refill: 0 -     predniSONE ; Take by mouth daily. Take 6 tabs by mouth daily  for 1 days, then 5 tabs for 1 days, then 4 tabs for 1 days, then 3 tabs for 1 days, 2 tabs for 1 days, then 1 tab by mouth daily for 1 days  Dispense: 21 tablet; Refill: 0  Lymphadenopathy -     Azithromycin ; Take 2 tablets on day 1, then 1 tablet daily on days 2 through  5  Dispense: 6 tablet; Refill: 0 -     predniSONE ; Take by mouth daily. Take 6 tabs by mouth daily  for 1 days, then 5 tabs for 1 days, then 4 tabs for 1 days, then 3 tabs for 1 days, 2 tabs for 1 days, then 1 tab by mouth daily for 1 days  Dispense: 21 tablet; Refill: 0   Assessment and Plan    Acute laryngitis with persistent hoarseness Persistent hoarseness and laryngeal inflammation for one month, likely viral but possible bacterial involvement. Previous treatments provided temporary relief. - Prescribed azithromycin  for potential bacterial component. - Continue prednisone  as per current regimen. - Recommended saline spray for sinus drainage. - Advised moist heat application and alternating warm and cold air exposure. - Use nebulized budesonide  as needed.  Generalized lymphadenopathy Swollen anterior lymph chain, likely reactive due to ongoing laryngeal inflammation.        No follow-ups on file.   Benton LITTIE Gave, PA

## 2024-11-04 NOTE — Patient Instructions (Signed)
-   Prescribed azithromycin  for potential bacterial component. - Continue prednisone  as per current regimen. - Recommended saline spray for sinus drainage. - Advised moist heat application and alternating warm and cold air exposure. - Use nebulized budesonide  as needed.
